# Patient Record
Sex: Female | Born: 1940
Health system: Southern US, Community
[De-identification: ages and names within clinical notes are randomized; demographics above are authoritative.]

## PROBLEM LIST (undated history)

## (undated) DIAGNOSIS — F32A Depression, unspecified: Secondary | ICD-10-CM

## (undated) DIAGNOSIS — K5790 Diverticulosis of intestine, part unspecified, without perforation or abscess without bleeding: Secondary | ICD-10-CM

## (undated) DIAGNOSIS — J189 Pneumonia, unspecified organism: Secondary | ICD-10-CM

## (undated) DIAGNOSIS — K219 Gastro-esophageal reflux disease without esophagitis: Secondary | ICD-10-CM

## (undated) DIAGNOSIS — C50912 Malignant neoplasm of unspecified site of left female breast: Secondary | ICD-10-CM

## (undated) DIAGNOSIS — J45909 Unspecified asthma, uncomplicated: Secondary | ICD-10-CM

## (undated) DIAGNOSIS — C50911 Malignant neoplasm of unspecified site of right female breast: Secondary | ICD-10-CM

## (undated) DIAGNOSIS — I10 Essential (primary) hypertension: Secondary | ICD-10-CM

## (undated) DIAGNOSIS — D649 Anemia, unspecified: Secondary | ICD-10-CM

## (undated) DIAGNOSIS — I890 Lymphedema, not elsewhere classified: Secondary | ICD-10-CM

## (undated) DIAGNOSIS — Z9289 Personal history of other medical treatment: Secondary | ICD-10-CM

## (undated) DIAGNOSIS — Z8719 Personal history of other diseases of the digestive system: Secondary | ICD-10-CM

## (undated) DIAGNOSIS — F329 Major depressive disorder, single episode, unspecified: Secondary | ICD-10-CM

## (undated) DIAGNOSIS — M1990 Unspecified osteoarthritis, unspecified site: Secondary | ICD-10-CM

## (undated) HISTORY — DX: Diverticulosis of intestine, part unspecified, without perforation or abscess without bleeding: K57.90

## (undated) HISTORY — DX: Anemia, unspecified: D64.9

## (undated) HISTORY — PX: TOTAL KNEE ARTHROPLASTY: SHX125

## (undated) HISTORY — PX: APPENDECTOMY: SHX54

## (undated) HISTORY — DX: Major depressive disorder, single episode, unspecified: F32.9

## (undated) HISTORY — PX: CATARACT EXTRACTION W/ INTRAOCULAR LENS  IMPLANT, BILATERAL: SHX1307

## (undated) HISTORY — DX: Unspecified osteoarthritis, unspecified site: M19.90

## (undated) HISTORY — PX: COLONOSCOPY: SHX174

## (undated) HISTORY — DX: Depression, unspecified: F32.A

## (undated) HISTORY — PX: TONSILLECTOMY: SUR1361

## (undated) HISTORY — PX: CHOLECYSTECTOMY OPEN: SUR202

## (undated) HISTORY — DX: Lymphedema, not elsewhere classified: I89.0

## (undated) HISTORY — PX: JOINT REPLACEMENT: SHX530

## (undated) HISTORY — PX: BASAL CELL CARCINOMA EXCISION: SHX1214

## (undated) HISTORY — PX: ABDOMINAL HYSTERECTOMY: SHX81

---

## 1965-08-01 DIAGNOSIS — C50912 Malignant neoplasm of unspecified site of left female breast: Secondary | ICD-10-CM

## 1965-08-01 HISTORY — PX: BREAST BIOPSY: SHX20

## 1965-08-01 HISTORY — PX: MASTECTOMY: SHX3

## 1965-08-01 HISTORY — DX: Malignant neoplasm of unspecified site of left female breast: C50.912

## 2001-08-01 DIAGNOSIS — Z9289 Personal history of other medical treatment: Secondary | ICD-10-CM

## 2001-08-01 DIAGNOSIS — C50911 Malignant neoplasm of unspecified site of right female breast: Secondary | ICD-10-CM

## 2001-08-01 HISTORY — PX: BREAST BIOPSY: SHX20

## 2001-08-01 HISTORY — DX: Malignant neoplasm of unspecified site of right female breast: C50.911

## 2001-08-01 HISTORY — PX: MASTECTOMY: SHX3

## 2001-08-01 HISTORY — DX: Personal history of other medical treatment: Z92.89

## 2002-07-01 LAB — HM COLONOSCOPY

## 2002-07-20 ENCOUNTER — Inpatient Hospital Stay (HOSPITAL_COMMUNITY): Admission: EM | Admit: 2002-07-20 | Discharge: 2002-07-23 | Payer: Self-pay | Admitting: Emergency Medicine

## 2002-07-20 ENCOUNTER — Encounter: Payer: Self-pay | Admitting: Internal Medicine

## 2002-10-10 ENCOUNTER — Other Ambulatory Visit: Admission: RE | Admit: 2002-10-10 | Discharge: 2002-10-10 | Payer: Self-pay | Admitting: Radiology

## 2002-11-04 ENCOUNTER — Ambulatory Visit (HOSPITAL_COMMUNITY): Admission: RE | Admit: 2002-11-04 | Discharge: 2002-11-05 | Payer: Self-pay | Admitting: General Surgery

## 2002-11-04 ENCOUNTER — Encounter: Payer: Self-pay | Admitting: General Surgery

## 2002-11-04 ENCOUNTER — Encounter (INDEPENDENT_AMBULATORY_CARE_PROVIDER_SITE_OTHER): Payer: Self-pay | Admitting: Specialist

## 2002-12-02 ENCOUNTER — Ambulatory Visit (HOSPITAL_COMMUNITY): Admission: RE | Admit: 2002-12-02 | Discharge: 2002-12-02 | Payer: Self-pay | Admitting: Hematology & Oncology

## 2002-12-02 ENCOUNTER — Encounter: Payer: Self-pay | Admitting: Hematology & Oncology

## 2002-12-04 ENCOUNTER — Encounter: Payer: Self-pay | Admitting: General Surgery

## 2002-12-04 ENCOUNTER — Ambulatory Visit (HOSPITAL_BASED_OUTPATIENT_CLINIC_OR_DEPARTMENT_OTHER): Admission: RE | Admit: 2002-12-04 | Discharge: 2002-12-04 | Payer: Self-pay | Admitting: General Surgery

## 2003-09-03 ENCOUNTER — Ambulatory Visit (HOSPITAL_BASED_OUTPATIENT_CLINIC_OR_DEPARTMENT_OTHER): Admission: RE | Admit: 2003-09-03 | Discharge: 2003-09-03 | Payer: Self-pay | Admitting: General Surgery

## 2004-07-14 ENCOUNTER — Ambulatory Visit: Payer: Self-pay | Admitting: Hematology & Oncology

## 2004-07-28 ENCOUNTER — Encounter: Admission: RE | Admit: 2004-07-28 | Discharge: 2004-09-08 | Payer: Self-pay | Admitting: Hematology & Oncology

## 2004-11-09 ENCOUNTER — Ambulatory Visit: Payer: Self-pay | Admitting: Hematology & Oncology

## 2004-12-04 ENCOUNTER — Emergency Department (HOSPITAL_COMMUNITY): Admission: EM | Admit: 2004-12-04 | Discharge: 2004-12-04 | Payer: Self-pay | Admitting: Family Medicine

## 2004-12-06 ENCOUNTER — Emergency Department (HOSPITAL_COMMUNITY): Admission: EM | Admit: 2004-12-06 | Discharge: 2004-12-06 | Payer: Self-pay | Admitting: Family Medicine

## 2005-03-14 ENCOUNTER — Ambulatory Visit: Payer: Self-pay | Admitting: Hematology & Oncology

## 2005-09-09 ENCOUNTER — Ambulatory Visit: Payer: Self-pay | Admitting: Hematology & Oncology

## 2006-03-09 ENCOUNTER — Ambulatory Visit: Payer: Self-pay | Admitting: Hematology & Oncology

## 2006-03-13 LAB — COMPREHENSIVE METABOLIC PANEL
ALT: 20 U/L (ref 0–40)
AST: 19 U/L (ref 0–37)
CO2: 29 mEq/L (ref 19–32)
Calcium: 9.4 mg/dL (ref 8.4–10.5)
Chloride: 105 mEq/L (ref 96–112)
Sodium: 143 mEq/L (ref 135–145)
Total Bilirubin: 0.8 mg/dL (ref 0.3–1.2)
Total Protein: 6.3 g/dL (ref 6.0–8.3)

## 2006-03-13 LAB — CBC WITH DIFFERENTIAL/PLATELET
BASO%: 0.2 % (ref 0.0–2.0)
Eosinophils Absolute: 0.1 10*3/uL (ref 0.0–0.5)
HCT: 42.9 % (ref 34.8–46.6)
MCHC: 34.4 g/dL (ref 32.0–36.0)
MONO#: 0.3 10*3/uL (ref 0.1–0.9)
NEUT#: 3.1 10*3/uL (ref 1.5–6.5)
NEUT%: 66.6 % (ref 39.6–76.8)
Platelets: 236 10*3/uL (ref 145–400)
WBC: 4.6 10*3/uL (ref 3.9–10.0)
lymph#: 1.1 10*3/uL (ref 0.9–3.3)

## 2006-07-31 ENCOUNTER — Inpatient Hospital Stay (HOSPITAL_COMMUNITY): Admission: RE | Admit: 2006-07-31 | Discharge: 2006-08-03 | Payer: Self-pay | Admitting: Orthopedic Surgery

## 2006-09-06 ENCOUNTER — Ambulatory Visit: Payer: Self-pay | Admitting: Hematology & Oncology

## 2006-09-11 LAB — CBC WITH DIFFERENTIAL/PLATELET
BASO%: 0.3 % (ref 0.0–2.0)
EOS%: 4.7 % (ref 0.0–7.0)
HCT: 40.2 % (ref 34.8–46.6)
LYMPH%: 23.8 % (ref 14.0–48.0)
MCH: 31.9 pg (ref 26.0–34.0)
MCHC: 34.1 g/dL (ref 32.0–36.0)
MONO#: 0.3 10*3/uL (ref 0.1–0.9)
NEUT%: 64.7 % (ref 39.6–76.8)
Platelets: 257 10*3/uL (ref 145–400)
RBC: 4.3 10*6/uL (ref 3.70–5.32)
WBC: 4.7 10*3/uL (ref 3.9–10.0)

## 2006-09-11 LAB — COMPREHENSIVE METABOLIC PANEL
ALT: 11 U/L (ref 0–35)
AST: 16 U/L (ref 0–37)
Alkaline Phosphatase: 98 U/L (ref 39–117)
CO2: 27 mEq/L (ref 19–32)
Creatinine, Ser: 0.65 mg/dL (ref 0.40–1.20)
Sodium: 142 mEq/L (ref 135–145)
Total Bilirubin: 0.7 mg/dL (ref 0.3–1.2)
Total Protein: 6.5 g/dL (ref 6.0–8.3)

## 2006-11-06 ENCOUNTER — Inpatient Hospital Stay (HOSPITAL_COMMUNITY): Admission: RE | Admit: 2006-11-06 | Discharge: 2006-11-08 | Payer: Self-pay | Admitting: Orthopedic Surgery

## 2007-03-12 ENCOUNTER — Ambulatory Visit: Payer: Self-pay | Admitting: Hematology & Oncology

## 2007-04-04 LAB — COMPREHENSIVE METABOLIC PANEL
ALT: 11 U/L (ref 0–35)
Alkaline Phosphatase: 93 U/L (ref 39–117)
CO2: 25 mEq/L (ref 19–32)
Creatinine, Ser: 0.71 mg/dL (ref 0.40–1.20)
Sodium: 139 mEq/L (ref 135–145)
Total Bilirubin: 0.5 mg/dL (ref 0.3–1.2)

## 2007-04-04 LAB — CBC WITH DIFFERENTIAL/PLATELET
BASO%: 0.4 % (ref 0.0–2.0)
HCT: 39.2 % (ref 34.8–46.6)
LYMPH%: 21.9 % (ref 14.0–48.0)
MCH: 31.4 pg (ref 26.0–34.0)
MCHC: 34.8 g/dL (ref 32.0–36.0)
MCV: 90.1 fL (ref 81.0–101.0)
MONO#: 0.4 10*3/uL (ref 0.1–0.9)
MONO%: 6 % (ref 0.0–13.0)
NEUT%: 68.9 % (ref 39.6–76.8)
Platelets: 286 10*3/uL (ref 145–400)
RBC: 4.35 10*6/uL (ref 3.70–5.32)

## 2007-10-15 ENCOUNTER — Ambulatory Visit: Payer: Self-pay | Admitting: Hematology & Oncology

## 2007-10-17 LAB — COMPREHENSIVE METABOLIC PANEL
ALT: 13 U/L (ref 0–35)
BUN: 12 mg/dL (ref 6–23)
CO2: 26 mEq/L (ref 19–32)
Calcium: 8.8 mg/dL (ref 8.4–10.5)
Chloride: 105 mEq/L (ref 96–112)
Creatinine, Ser: 0.66 mg/dL (ref 0.40–1.20)
Glucose, Bld: 95 mg/dL (ref 70–99)
Total Bilirubin: 0.5 mg/dL (ref 0.3–1.2)

## 2007-10-17 LAB — CBC WITH DIFFERENTIAL/PLATELET
BASO%: 0.7 % (ref 0.0–2.0)
Basophils Absolute: 0 10*3/uL (ref 0.0–0.1)
Eosinophils Absolute: 0.2 10*3/uL (ref 0.0–0.5)
HCT: 39.3 % (ref 34.8–46.6)
HGB: 13.8 g/dL (ref 11.6–15.9)
LYMPH%: 25.5 % (ref 14.0–48.0)
MCHC: 35 g/dL (ref 32.0–36.0)
MONO#: 0.4 10*3/uL (ref 0.1–0.9)
NEUT#: 3 10*3/uL (ref 1.5–6.5)
NEUT%: 61.8 % (ref 39.6–76.8)
Platelets: 237 10*3/uL (ref 145–400)
WBC: 4.9 10*3/uL (ref 3.9–10.0)
lymph#: 1.3 10*3/uL (ref 0.9–3.3)

## 2008-04-18 ENCOUNTER — Ambulatory Visit: Payer: Self-pay | Admitting: Hematology & Oncology

## 2008-05-01 LAB — CBC WITH DIFFERENTIAL (CANCER CENTER ONLY)
Eosinophils Absolute: 0.2 10*3/uL (ref 0.0–0.5)
LYMPH%: 26.8 % (ref 14.0–48.0)
MCH: 30.9 pg (ref 26.0–34.0)
MCV: 91 fL (ref 81–101)
MONO%: 6.2 % (ref 0.0–13.0)
NEUT%: 62.2 % (ref 39.6–80.0)
Platelets: 236 10*3/uL (ref 145–400)
RBC: 4.35 10*6/uL (ref 3.70–5.32)

## 2008-05-01 LAB — COMPREHENSIVE METABOLIC PANEL
ALT: 13 U/L (ref 0–35)
BUN: 14 mg/dL (ref 6–23)
CO2: 23 mEq/L (ref 19–32)
Calcium: 8.8 mg/dL (ref 8.4–10.5)
Chloride: 106 mEq/L (ref 96–112)
Creatinine, Ser: 0.31 mg/dL — ABNORMAL LOW (ref 0.40–1.20)
Total Bilirubin: 0.6 mg/dL (ref 0.3–1.2)

## 2008-11-18 ENCOUNTER — Ambulatory Visit: Payer: Self-pay | Admitting: Hematology & Oncology

## 2008-11-19 LAB — COMPREHENSIVE METABOLIC PANEL
ALT: 10 U/L (ref 0–35)
AST: 15 U/L (ref 0–37)
Albumin: 4.1 g/dL (ref 3.5–5.2)
Alkaline Phosphatase: 64 U/L (ref 39–117)
Calcium: 8.9 mg/dL (ref 8.4–10.5)
Chloride: 105 mEq/L (ref 96–112)
Creatinine, Ser: 0.74 mg/dL (ref 0.40–1.20)
Potassium: 4.1 mEq/L (ref 3.5–5.3)

## 2008-11-19 LAB — CBC WITH DIFFERENTIAL (CANCER CENTER ONLY)
BASO#: 0 10*3/uL (ref 0.0–0.2)
Eosinophils Absolute: 0.2 10*3/uL (ref 0.0–0.5)
HGB: 14.4 g/dL (ref 11.6–15.9)
LYMPH%: 23.9 % (ref 14.0–48.0)
MCH: 31.5 pg (ref 26.0–34.0)
MCV: 94 fL (ref 81–101)
MONO%: 5.7 % (ref 0.0–13.0)
NEUT#: 3.3 10*3/uL (ref 1.5–6.5)
RBC: 4.58 10*6/uL (ref 3.70–5.32)

## 2009-05-19 ENCOUNTER — Ambulatory Visit: Payer: Self-pay | Admitting: Hematology & Oncology

## 2009-05-21 LAB — CBC WITH DIFFERENTIAL (CANCER CENTER ONLY)
BASO#: 0 10*3/uL (ref 0.0–0.2)
Eosinophils Absolute: 0.3 10*3/uL (ref 0.0–0.5)
HGB: 14.3 g/dL (ref 11.6–15.9)
LYMPH#: 1.5 10*3/uL (ref 0.9–3.3)
MONO#: 0.4 10*3/uL (ref 0.1–0.9)
NEUT#: 3.4 10*3/uL (ref 1.5–6.5)
RBC: 4.35 10*6/uL (ref 3.70–5.32)

## 2009-05-22 LAB — COMPREHENSIVE METABOLIC PANEL
AST: 19 U/L (ref 0–37)
Alkaline Phosphatase: 51 U/L (ref 39–117)
BUN: 11 mg/dL (ref 6–23)
Creatinine, Ser: 0.73 mg/dL (ref 0.40–1.20)

## 2009-05-22 LAB — VITAMIN D 25 HYDROXY (VIT D DEFICIENCY, FRACTURES): Vit D, 25-Hydroxy: 20 ng/mL — ABNORMAL LOW (ref 30–89)

## 2009-11-17 ENCOUNTER — Ambulatory Visit: Payer: Self-pay | Admitting: Hematology & Oncology

## 2009-12-29 ENCOUNTER — Ambulatory Visit: Payer: Self-pay | Admitting: Hematology & Oncology

## 2009-12-30 LAB — CBC WITH DIFFERENTIAL (CANCER CENTER ONLY)
BASO#: 0.1 10*3/uL (ref 0.0–0.2)
BASO%: 1 % (ref 0.0–2.0)
EOS%: 2.8 % (ref 0.0–7.0)
Eosinophils Absolute: 0.2 10*3/uL (ref 0.0–0.5)
HCT: 43.2 % (ref 34.8–46.6)
HGB: 14.6 g/dL (ref 11.6–15.9)
LYMPH#: 1.5 10*3/uL (ref 0.9–3.3)
LYMPH%: 19.9 % (ref 14.0–48.0)
MCH: 32.3 pg (ref 26.0–34.0)
MCHC: 33.7 g/dL (ref 32.0–36.0)
MCV: 96 fL (ref 81–101)
MONO#: 0.4 10*3/uL (ref 0.1–0.9)
MONO%: 5.4 % (ref 0.0–13.0)
NEUT#: 5.5 10*3/uL (ref 1.5–6.5)
NEUT%: 70.9 % (ref 39.6–80.0)
Platelets: 229 10*3/uL (ref 145–400)
RBC: 4.51 10*6/uL (ref 3.70–5.32)
RDW: 11 % (ref 10.5–14.6)
WBC: 7.7 10*3/uL (ref 3.9–10.0)

## 2009-12-31 LAB — COMPREHENSIVE METABOLIC PANEL
ALT: 18 U/L (ref 0–35)
AST: 28 U/L (ref 0–37)
Albumin: 4 g/dL (ref 3.5–5.2)
Alkaline Phosphatase: 55 U/L (ref 39–117)
BUN: 10 mg/dL (ref 6–23)
CO2: 24 mEq/L (ref 19–32)
Calcium: 8.5 mg/dL (ref 8.4–10.5)
Chloride: 104 mEq/L (ref 96–112)
Creatinine, Ser: 0.63 mg/dL (ref 0.40–1.20)
Glucose, Bld: 97 mg/dL (ref 70–99)
Potassium: 4 mEq/L (ref 3.5–5.3)
Sodium: 139 mEq/L (ref 135–145)
Total Bilirubin: 0.7 mg/dL (ref 0.3–1.2)
Total Protein: 6.3 g/dL (ref 6.0–8.3)

## 2010-06-30 ENCOUNTER — Ambulatory Visit: Payer: Self-pay | Admitting: Hematology & Oncology

## 2010-07-01 LAB — CBC WITH DIFFERENTIAL (CANCER CENTER ONLY)
BASO%: 0.9 % (ref 0.0–2.0)
HCT: 47.1 % — ABNORMAL HIGH (ref 34.8–46.6)
LYMPH%: 24.5 % (ref 14.0–48.0)
MCH: 31.7 pg (ref 26.0–34.0)
MCHC: 32.6 g/dL (ref 32.0–36.0)
MCV: 97 fL (ref 81–101)
MONO#: 0.4 10*3/uL (ref 0.1–0.9)
MONO%: 6.5 % (ref 0.0–13.0)
NEUT%: 64.6 % (ref 39.6–80.0)
RDW: 10.4 % — ABNORMAL LOW (ref 10.5–14.6)
WBC: 6.5 10*3/uL (ref 3.9–10.0)

## 2010-07-02 LAB — COMPREHENSIVE METABOLIC PANEL
ALT: 15 U/L (ref 0–35)
Alkaline Phosphatase: 59 U/L (ref 39–117)
CO2: 20 mEq/L (ref 19–32)
Creatinine, Ser: 0.66 mg/dL (ref 0.40–1.20)
Glucose, Bld: 103 mg/dL — ABNORMAL HIGH (ref 70–99)
Total Bilirubin: 0.6 mg/dL (ref 0.3–1.2)

## 2010-07-02 LAB — VITAMIN D 25 HYDROXY (VIT D DEFICIENCY, FRACTURES): Vit D, 25-Hydroxy: 25 ng/mL — ABNORMAL LOW (ref 30–89)

## 2010-08-21 ENCOUNTER — Encounter: Payer: Self-pay | Admitting: Family Medicine

## 2010-12-17 NOTE — H&P (Signed)
[name], [name] NO.:  [id number]   MEDICAL RECORD NO.:  [id number]          PATIENT TYPE:  EMS   LOCATION:  MAJO                         FACILITY:  [facility]   PHYSICIAN:  Mila Homer. Sherlean Foot, M.D. DATE OF BIRTH:  01/26/1950   DATE OF ADMISSION:  11/06/2006  DATE OF DISCHARGE:                              HISTORY & PHYSICAL   CHIEF COMPLAINT:  End-stage osteoarthritis right knee.   HISTORY OF PRESENT ILLNESS:  [name] is a 70 year old white female  with a five to six year history of left knee pain, no known injury,  mechanical symptoms positive for giving way and painful grinding.  The  patient notes she is cane walk.  No waking pain.  The patient failed  conservative treatment of the right knee.  The patient underwent a left  total knee arthroplasty by Dr. Sherlean Foot on July 31, 2006 which is doing  well.   ALLERGIES:  THE PATIENT HAS A DRUG ALLERGY TO PENICILLIN.   MEDICATIONS:  1. Lexapro 10 mg daily.  2. Femara 2.5 mg daily.  3. Nexium 40 mg b.i.d.  4. Vicodin 5/325 one p.o. q.8h.  5. Celebrex 200 mg 1 daily.  6. Ambien CR 12.5 mg q.h.s. p.r.n.  7. Iron 150 mg one every other day.  8. __________  7.5 mg 1 daily.  9. __________ 10% eyedrops, 1 to 2 drops daily p.r.n.   PAST MEDICAL HISTORY:  GERD.   PAST SURGICAL HISTORY:  1. Appendectomy.  2. Left knee replacement December 2007.  3. Hysterectomy in 1967.  4. Tonsillectomy in 1985.  5. In 1986, cholecystectomy.  6. Bilateral mastectomies.   SOCIAL HISTORY:  The patient has a history of smoking, quit in 1965.  She denies any alcohol abuse.  She lives in a [place] home.  The  patient's primary care physician is Dr. Ashok Norris, [facility].   FAMILY HISTORY:  The patient's mother age 51 with borderline  hypertension and asthma.  Father deceased at age 81 with CHF.  She has  one sibling, a sister, who is age 48 and has hypertension.   REVIEW OF SYSTEMS:  She has dentures on the top and the  bottom.  She  wears glasses.  History of breast cancer.  Has nocturia 2 to 3 times at  night.  She suffers from depression.  Otherwise, review of systems is  negative and noncontributory.   PHYSICAL EXAMINATION:  The patient is a well-nourished, overweight  female who walks with an antalgic gait and a limp on the right.  The  patient's mood and affect are appropriate.  She talks easily with the  examiner.  Height 5 feet 7 inches, weight 220 pounds.  VITAL SIGNS:  Temperature is 98.4, pulse 66, respiratory rate 18, blood  pressure 110/60.  CARDIAC:  Regular rate and rhythm, no murmur, rubs, or gallops noted.  LUNGS:  Clear to auscultation bilaterally.  No wheezing, rhonchi, or  rales noted.  ABDOMEN:  Soft, nontender, bowel sounds x4 quadrants.  She does have a  well-healed surgical incision from a previous cholecystectomy.  BREASTS, INTEGUMENTARY, AND RECTAL:  Deferred  at this time.  HEENT:  Head is normocephalic, atraumatic.  The patient does have  maxillary sinus tenderness.  No frontal tenderness to palpation.  Conjunctivae are pink.  Sclerae is nonicteric, PERRLA, EOMs are intact.  No visible external ear deformities.  TMs pearly and gray bilaterally.  Nose:  Nasal septum midline.  Nasal mucosa pink and moist without  polyps.  Nares are pink and moist.  Pharynx without erythema or exudate.  Tongue and uvula is midline.  NECK:  Trachea midline, no lymphadenopathy.  Carotids are 2+ without  bruits.  No tenderness along the cervical spine with palpation.  She has  full range of motion of the cervical spine.  NEUROLOGIC:  The patient is alert and oriented x3.  Cranial nerves II-  XII grossly intact.  Lower extremity strength testing reveals 5 over 5  strength throughout.  BACK:  No tenderness with palpation over the thoracic or lumbar spine.  MUSCULOSKELETAL:  Upper extremities, she has full range of motion of the  upper extremities without pain.  Upper extremities with symmetric  size  and shape.  Radial pulses are 2+ bilaterally and equal and symmetric.  She has good sensation to light touch throughout the fingertips.  Lower  extremities:  She has full range of motion of both hips without pain.  Right knee 0 to 125 degrees flexion, valgus deformity at 10 degrees.  Slight shift with valgus stressing.  Positive tenderness over the  lateral joint line with palpation.  Left knee 0 to 125 degrees,  valgus/varus stressing reveals no instability, a well-healed surgical  incision.  Dorsal and pedal pulses are 2+ bilaterally.  She has good  sensation to the feet bilaterally to light touch.   X-rays of the right knee show tricompartmental osteoarthritis.  She has  bone on bone with valgus deformity.  She has bone on bone lateral  compartment with valgus deformity.   IMPRESSION:  1. End-stage osteoarthritis of the right knee.  2. History of left total knee December 2007.  3. History of breast cancer.  4. Depression.  5. Maxillary sinus tenderness.   PLAN:  1. The patient is to undergo a right total knee arthroplasty by Dr.      Sherlean Foot on November 06, 2006.  2. Empirical treatment with Z-Pak for maxillary sinus tenderness.  3. The patient to undergo all preoperative labs and testing prior to      surgery.      Richardean Canal, P.A.    ______________________________  Mila Homer. Sherlean Foot, M.D.    GC/MEDQ  D:  10/31/2006  T:  10/31/2006  Job:  [id number]   cc:   Mila Homer. Sherlean Foot, M.D.

## 2010-12-17 NOTE — Discharge Summary (Signed)
[name], [name]             ACCOUNT NO.:  [id number]   MEDICAL RECORD NO.:  [id number]          PATIENT TYPE:  INP   LOCATION:  5011                         FACILITY:  [facility]   PHYSICIAN:  Mila Homer. Sherlean Foot, M.D. DATE OF BIRTH:  [birth year]   DATE OF ADMISSION:  07/31/2006  DATE OF DISCHARGE:  08/03/2006                               DISCHARGE SUMMARY   ADMISSION DIAGNOSES:  1. End-stage osteoarthritis bilateral knees, left worse than right.  2. Breast cancer.  3. Depression.   DISCHARGE DIAGNOSES:  1. End-stage osteoarthritis bilateral knees status post left total      knee arthroplasty.  2. Acute blood loss anemia secondary to surgery.  3. Mild hypokalemia, now resolved.  4. Constipation.  5. Breast cancer.  6. Depression.   SURGICAL PROCEDURES:  On July 31, 2006 [name] underwent a left  total knee arthroplasty by Dr. Mila Homer. Lucey, assisted by Rexene Edison  PA-C.  She had a NexGen femoral component size D left placed with a  NexGen all poly patella standard size 35, 9 mm thickness.  A NexGen  complete fluted stem tibial component size 6 with an articular surface  size striped green CD 17 mm height.   COMPLICATIONS:  None.   CONSULTS:  1. Physical therapy consult August 01, 2006.  2. Occupational therapy consult and case management consult August 02, 2006.   HISTORY OF PRESENT ILLNESS:  This 70 year old white female patient  presented to Dr. Sherlean Foot with a history of osteoarthritis of bilateral  knees.  The left currently is worse than right.  She has had great  difficulty with pain in the knees and ambulation with them giving way at  times.  She has failed conservative treatment and x-rays show end-stage  arthritic changes.  Because of this, she is presenting for a left knee  replacement.   HOSPITAL COURSE:  [name] tolerated her surgical procedure well  without immediate postoperative complications.  She was transferred to  5000.  Postop day #1:   T-max 100.2, vitals were stable.  Hemoglobin  11.5, hematocrit 32.9.  Leg was neurovascularly intact.  She was started  on therapy per protocol.   Postop day #2:  Pain was fairly well controlled.  She remained afebrile,  vitals were stable.  Potassium was low at 3.3 and that was subsequently  supplemented.  She had made some slow progress with therapy the day  before partially due to the arthritis in her right knee and that was  continued.   Postop day #3: She is doing well and wants to go home.  She remains  afebrile, hemoglobin 9.7, hematocrit 28.4 and potassium is improved to  3.9.  It was felt she is ready for discharge home later today.  After  treatment with a laxative she will be discharged home.   DISCHARGE INSTRUCTIONS:  Diet:  She can resume her regular  prehospitalization diet.   MEDICATIONS:  She may resume her prehospitalization meds except no  Vicodin while on other pain meds.  Home meds include:  1. Omeprazole 20 mg p.o. every morning.  2. Lexapro 10 mg p.o. every bed time.  3. Ambien CR 12.5 mg p.o. every bed time p.r.n. insomnia.  4. Femara 2.5 mg p.o. every bed time.  5. Vicodin 10/650 one p.o. every 4h p.r.n. for pain; not to take while      on Percocet.  6. Poly iron 150 mg p.o. every other day.  7. TUMS 1 tablet p.o. b.i.d.  8. Eye drops apply t.i.d.   Additional meds at this time include:  1. Lovenox 40 mg subcu every morning, last dose January 14; 9 with no      refill.  2. Percocet 5/325 mg 1-2 tablets p.o. every 4h p.r.n. for pain; 60      with no refill.  3. Robaxin 500 mg 1-2 tablets p.o. every 6h p.r.n. for spasms; 40 with      no refill.   ACTIVITY:  She can be out of bed weightbearing as tolerated on the left  leg with use of a walker.  She may use an immobilizer on the right leg  for stability if needed due to her severe ROA.  She needs home CPM 0-90  degrees 6 to 8 hours a day and home health PT per [facility].  Please see the  blue total knee discharge sheet for further  activity and instructions.   WOUND CARE:  She may shower after no drainage from the wound for 2 days.  Please see the blue total knee discharge sheet for further wound care  instructions.   FOLLOW-UP:  She needs to follow up with Dr. Sherlean Foot in our office on  Tuesday, August 15, 2006 and needs to call [phone] for that  appointment.   LABORATORY DATA:  Chest x-ray done on an December 24 showed COPD, stable  cardiomegaly and hiatal hernia.   Hemoglobin/hematocrit have ranged from 14.6 and 42.7 on December 24 to  9.7 and 28.4 on January 3.  White count went from 6.6 on the 24th to a  high of 10 on the 1st.  Platelets went from 225 on the 24th to 164 on 3  January.   Potassium dropped to a low of 3.3 on January 2;  on January 3 it was  3.9.  Glucose went to high of 152 on January 3.  Calcium to a low of 8.2  on the 1st and total protein 5.9 on the on December 24.  All other  laboratory studies were within normal limits.      Legrand Pitts Duffy, P.A.    ______________________________  Mila Homer. Sherlean Foot, M.D.   KED/MEDQ  D:  08/03/2006  T:  08/03/2006  Job:  [id number]

## 2010-12-17 NOTE — Consult Note (Signed)
NAME:  [name], [name]                       ACCOUNT NO.:  [id number]   MEDICAL RECORD NO.:  [id number]                   PATIENT TYPE:  INP   LOCATION:  0447                                 FACILITY:  [facility]   PHYSICIAN:  Petra Kuba, M.D.                 DATE OF BIRTH:  March 26, 1941   DATE OF CONSULTATION:  DATE OF DISCHARGE:                                   CONSULTATION   HISTORY OF PRESENT ILLNESS:  The patient is seen at the request of Dr.  __________  for severe iron deficiency.  She has not any blood in her  bowels, is guaiac negative, but has not had any previous colonic screening  or GI tests.  She does have a moderate amount of reflux with some help from  over the counter Zantac two a day.  She has no swallowing problems or change  in bowel habits.  She has been trying to lose some weight, but has lost  about 70 pounds in the last year.  She has felt weak and run down for some  time.  She came to the ER because of bronchitis and was found to have  hemoglobin of 3 and was admitted for further workup, plans, and transfusion.   PAST MEDICAL HISTORY:  1. Pertinent for breast cancer at a young age with a complete hysterectomy  2. Cholecystectomy.  3. Tonsillectomy.   SOCIAL HISTORY:  Minimizes over the counter medicine use and does not smoke  or drink.   FAMILY HISTORY:  Negative for any obvious GI problems in the family, colon,  cancer, or colon polyps.   MEDICATIONS AT HOME:  None except for the over the counter Zantac.   ALLERGIES:  PENICILLIN.   REVIEW OF SYSTEMS:  Pertinent for no blood in the urine, or femoral vagina,  and she has not shown any in her stool.   PHYSICAL EXAMINATION:  GENERAL:  No acute distress.  The patient looks well  lying in the bed.  Not examined today.  Will be prior to  colonoscopy/endoscopy.   ASSESSMENT:  1. History of breast cancer at a young age with a complete hysterectomy.  2. Longstanding reflux.  3. Iron deficiency in a  patient due for colonic screen.   PLAN:  The risks, benefits, and methods of colonoscopy and if negative, will  tolerate an endoscopy, were discussed with the patient and her daughter, and  will proceed tomorrow by either me or one of my partners which I have  explained to her, with further workup and plans pending those findings.  If  nondiagnostic workup, can probably go home with outpatient small bowel  follow-through or possibly an __________  at some point in the future, or to  just follow her CBCs on iron and repeat guaiacs to see  if any further  workup plans are needed.  Petra Kuba, M.D.    MEM/MEDQ  D:  07/21/2002  T:  07/21/2002  Job:  [id number]   cc:   __________ __________, MD

## 2010-12-17 NOTE — Op Note (Signed)
NAMESAYDEE, ZOLMAN NO.:  [id number]   MEDICAL RECORD NO.:  [id number]          PATIENT TYPE:  INP   LOCATION:  2899                         FACILITY:  [facility]   PHYSICIAN:  Mila Homer. Sherlean Foot, M.D. DATE OF BIRTH:  03-17-1941   DATE OF PROCEDURE:  11/06/2006  DATE OF DISCHARGE:                               OPERATIVE REPORT   SURGEON:  Mila Homer. Sherlean Foot, M.D.   ASSISTANT:  Arnoldo Morale, PA   ANESTHESIA:  General.   PREOPERATIVE DIAGNOSIS:  Right knee osteoarthritis.   POSTOPERATIVE DIAGNOSIS:  Right knee osteoarthritis.  Right knee osteoarthritis.   PROCEDURE:  Right total knee arthroplasty.   INDICATIONS FOR PROCEDURE:  The patient is a 70 year old white female  who had her left knee done six months ago, did very, very well.  Has  failed conservative measures and went to the operating room under  informed consent.   DESCRIPTION OF PROCEDURE:  The patient was laid supine, administered  general anesthesia, Foley catheter placed and right leg was prepped and  draped in usual sterile fashion.  Leg was exsanguinated and tourniquet  set at 350 mmHg and for 1 hour.  I then made a midline incision  approximately 7 inches in length with a #10 blade.  I used 10 blade to  make a medial parapatellar arthrotomy and performed a synovectomy.  I  elevated the deep MCL off the medial crest of the tibia but not down  more than 2 cm since this was a valgus knee.  I then everted the  patella, measured 20 mm.  I reamed down to 12 and with the 32-mm  template, drilled three lug holes and then with prosthetic trial in  place, recreated the 20 mm thickness.  I then went into flexion and  subluxed the patella laterally.  I used the extramedullary alignment  system on the tibia make a perpendicular cut to the anatomic axis of  tibia.  I then made an intramedullary drill hole in the femur using  intramedullary guide set on 4 degree valgus cut.  I used the distal  femoral cutting  block and made the distal femoral cut with a sagittal  saw.  Then marked out the epicondylar axis and posterior condylar angle  which measured 5 degrees.  I then sized to size E, pinned it to 5 degree  external rotation holes and used a 4 in 1 cutting block to make anterior  posterior and chamfer cuts.  I then placed a lamina spreader in the  knee, removed the posterior condylar osteophytes ACL, PCL, medial and  lateral menisci.  I then obtained flexion/extension gap balance, had to  release the lateral structures through a pie crusting technique.  The 12-  mm spacer block then afforded good flexion extension gap balance.  I  then finished the femur with a size E finishing block and cut for a high  flex with given the flexibility to use the gender specific knee.  Then  finished the tibia with a size 5 tibial tray drilling the keel.  Upon  trialing, I felt that the regular E LPS  flex offered more coverage and  went ahead and went with that.  I then removed trial components,  copiously irrigated and then cemented in the E LPS flex femur.  The size  5 tibial tray, a 12 mm poly, a 32-mm patella, removed all excess cement,  allowed the cement harden in extension.  At this point I then placed a  Hemovac coming out superolaterally and deep the arthrotomy and pain  catheter coming out supermedial and superficial to the arthrotomy.  I  injected capsule 20 mL of 0.5% Marcaine with epinephrine.  I then let  the tourniquet down which was at 53 minutes and obtained hemostasis.  I  then copiously irrigated once again.  I then closed the arthrotomy with  figure-of-eight 1-0 Vicryl sutures, deep soft tissues with interrupted 0-  0 Vicryl sutures, subcuticular 2-0 Vicryl stitch and skin staples.   COMPLICATIONS:  None.   DRAINS:  One Hemovac, one pain catheter.   ESTIMATED BLOOD LOSS:  300 mL.   TOURNIQUET TIME:  53 minutes.           ______________________________  Mila Homer Sherlean Foot, M.D.      SDL/MEDQ  D:  11/06/2006  T:  11/06/2006  Job:  [id number]

## 2011-02-03 ENCOUNTER — Other Ambulatory Visit: Payer: Self-pay | Admitting: Hematology & Oncology

## 2011-02-03 ENCOUNTER — Encounter: Payer: Medicare Other | Admitting: Hematology & Oncology

## 2011-02-03 ENCOUNTER — Encounter (HOSPITAL_BASED_OUTPATIENT_CLINIC_OR_DEPARTMENT_OTHER): Payer: Medicare Other | Admitting: Hematology & Oncology

## 2011-02-03 DIAGNOSIS — J40 Bronchitis, not specified as acute or chronic: Secondary | ICD-10-CM

## 2011-02-03 DIAGNOSIS — C50919 Malignant neoplasm of unspecified site of unspecified female breast: Secondary | ICD-10-CM

## 2011-02-03 DIAGNOSIS — Z170 Estrogen receptor positive status [ER+]: Secondary | ICD-10-CM

## 2011-02-03 LAB — CBC WITH DIFFERENTIAL (CANCER CENTER ONLY)
BASO#: 0 10*3/uL (ref 0.0–0.2)
HCT: 41.7 % (ref 34.8–46.6)
HGB: 14.5 g/dL (ref 11.6–15.9)
LYMPH#: 1.1 10*3/uL (ref 0.9–3.3)
MONO#: 0.6 10*3/uL (ref 0.1–0.9)
NEUT%: 74.1 % (ref 39.6–80.0)
WBC: 7.6 10*3/uL (ref 3.9–10.0)

## 2011-02-03 LAB — COMPREHENSIVE METABOLIC PANEL
BUN: 12 mg/dL (ref 6–23)
CO2: 25 mEq/L (ref 19–32)
Calcium: 9 mg/dL (ref 8.4–10.5)
Chloride: 104 mEq/L (ref 96–112)
Creatinine, Ser: 0.84 mg/dL (ref 0.50–1.10)
Glucose, Bld: 92 mg/dL (ref 70–99)

## 2011-02-08 ENCOUNTER — Ambulatory Visit: Payer: Medicare Other | Admitting: Hematology & Oncology

## 2011-03-01 ENCOUNTER — Encounter: Payer: Self-pay | Admitting: Family Medicine

## 2011-03-01 DIAGNOSIS — E559 Vitamin D deficiency, unspecified: Secondary | ICD-10-CM | POA: Insufficient documentation

## 2011-03-01 DIAGNOSIS — C50911 Malignant neoplasm of unspecified site of right female breast: Secondary | ICD-10-CM | POA: Insufficient documentation

## 2011-03-01 DIAGNOSIS — K5790 Diverticulosis of intestine, part unspecified, without perforation or abscess without bleeding: Secondary | ICD-10-CM | POA: Insufficient documentation

## 2011-06-29 ENCOUNTER — Other Ambulatory Visit: Payer: Self-pay | Admitting: *Deleted

## 2011-06-29 DIAGNOSIS — F329 Major depressive disorder, single episode, unspecified: Secondary | ICD-10-CM

## 2011-06-29 DIAGNOSIS — C50919 Malignant neoplasm of unspecified site of unspecified female breast: Secondary | ICD-10-CM

## 2011-06-29 NOTE — Telephone Encounter (Signed)
Received a refill request from [facility] pharmacy requesting Lexapro 10 mg po daily #30. Request sent to Dr Myna Hidalgo for approval.

## 2011-06-30 MED ORDER — ESCITALOPRAM OXALATE 10 MG PO TABS: 10.0000 mg | ORAL_TABLET | Freq: Every day | ORAL | Status: DC

## 2011-08-09 ENCOUNTER — Telehealth: Payer: Self-pay | Admitting: Hematology & Oncology

## 2011-08-09 NOTE — Telephone Encounter (Signed)
Pt called and cx 08/10/11 apt due to being sick.  She resch for 09/13/11

## 2011-08-10 ENCOUNTER — Ambulatory Visit: Payer: Medicare Other | Admitting: Hematology & Oncology

## 2011-08-10 ENCOUNTER — Other Ambulatory Visit: Payer: Medicare Other | Admitting: Lab

## 2011-09-13 ENCOUNTER — Other Ambulatory Visit (HOSPITAL_BASED_OUTPATIENT_CLINIC_OR_DEPARTMENT_OTHER): Payer: Medicare Other | Admitting: Lab

## 2011-09-13 ENCOUNTER — Ambulatory Visit (HOSPITAL_BASED_OUTPATIENT_CLINIC_OR_DEPARTMENT_OTHER): Payer: Medicare Other | Admitting: Hematology & Oncology

## 2011-09-13 ENCOUNTER — Telehealth: Payer: Self-pay | Admitting: Hematology & Oncology

## 2011-09-13 DIAGNOSIS — M810 Age-related osteoporosis without current pathological fracture: Secondary | ICD-10-CM

## 2011-09-13 DIAGNOSIS — M4000 Postural kyphosis, site unspecified: Secondary | ICD-10-CM

## 2011-09-13 DIAGNOSIS — C50919 Malignant neoplasm of unspecified site of unspecified female breast: Secondary | ICD-10-CM

## 2011-09-13 DIAGNOSIS — Z170 Estrogen receptor positive status [ER+]: Secondary | ICD-10-CM

## 2011-09-13 LAB — CBC WITH DIFFERENTIAL (CANCER CENTER ONLY)
BASO%: 0.5 % (ref 0.0–2.0)
EOS%: 4.7 % (ref 0.0–7.0)
LYMPH%: 29.3 % (ref 14.0–48.0)
MCH: 32.3 pg (ref 26.0–34.0)
MCHC: 33.4 g/dL (ref 32.0–36.0)
MCV: 97 fL (ref 81–101)
MONO#: 0.5 10*3/uL (ref 0.1–0.9)
MONO%: 7.7 % (ref 0.0–13.0)
Platelets: 183 10*3/uL (ref 145–400)
RDW: 12.8 % (ref 11.1–15.7)
WBC: 6 10*3/uL (ref 3.9–10.0)

## 2011-09-13 NOTE — Progress Notes (Signed)
CC:   [name], M.D. [name]. [name], M.D.  DIAGNOSIS:  Stage IIB (T2 N1 M0) ductal carcinoma of the right breast.  CURRENT THERAPY:  Observation.  INTERIM HISTORY:  [name] comes in for followup.  We last saw her back in July 2012.  Since then she has been doing well.  She has had no complaints.  There has been no change in her medications.  She has had no problems with nausea or vomiting.  There has been no bony pain.  She has had no change in bowel or bladder habits.  She did "bump her leg"a few days ago with her left leg.  She has a little bit of soreness in the pretibial area.  She has had no headache.  There has been no dysphasia or odynophagia.  PHYSICAL EXAM:  This is a well-developed well-nourished white female in no obvious distress.  Vital signs:  98.5, pulse 63, respiratory rate 18, blood pressure 125/79, weight is 246.  Head and neck:  Normocephalic, atraumatic skull.  There are no ocular or oral lesions.  There are no palpable cervical or supraclavicular lymph nodes.  Lungs:  Clear bilaterally.  Cardiac:  Regular rate and rhythm with a normal S1, S2. There are no murmurs, rubs or bruits.  Breasts:  Bilateral mastectomies. Chest wall is unremarkable.  She has no nodularity or erythema on the chest wall bilaterally.  There is no bilateral axillary adenopathy. Abdomen:  Soft with good bowel sounds.  There is no fluid wave.  There is no guarding or rebound tenderness.  There is no palpable hepatosplenomegaly.  Back:  She has some kyphosis.  No tenderness is noted over the spine, ribs, or hips.  Extremities:  No clubbing, cyanosis or edema.  I cannot detect any kind of phlebitis with the left leg.  She does have some osteoarthritic changes in her joints. Neurological:  No focal neurological deficits.  LABORATORY STUDIES:  White cell count is 6, hemoglobin 14.5, hematocrit 43.4, platelet count 183.  IMPRESSION:  [name] is a 71 year old white female with  history of stage IIB ductal carcinoma of the right breast.  She underwent mastectomy.  She had 1 positive lymph node.  The tumor is ER positive. She did receive adjuvant chemotherapy with epirubicin Cytoxan.  She completed this in I think June of 2004.  She then went on to receive Femara.  She completed Femara I think back in the end of 2011.  I do not see any evidence of recurrent disease.  Again, there is always that risk.  Will go ahead and plan to get her back in 6 more months for followup.  I do not see a need for any x-ray studies in between visits.   ______________________________ Josph Macho, M.D. PRE/MEDQ  D:  09/13/2011  T:  09/13/2011  Job:  [id number]

## 2011-09-13 NOTE — Progress Notes (Signed)
This office note has been dictated.

## 2011-09-13 NOTE — Telephone Encounter (Signed)
Mailed 03-12-12 schedule

## 2011-09-14 LAB — COMPREHENSIVE METABOLIC PANEL
ALT: 15 U/L (ref 0–35)
Alkaline Phosphatase: 53 U/L (ref 39–117)
Creatinine, Ser: 0.64 mg/dL (ref 0.50–1.10)
Sodium: 143 mEq/L (ref 135–145)
Total Bilirubin: 0.6 mg/dL (ref 0.3–1.2)
Total Protein: 6.4 g/dL (ref 6.0–8.3)

## 2011-09-14 LAB — LACTATE DEHYDROGENASE: LDH: 181 U/L (ref 94–250)

## 2011-09-20 ENCOUNTER — Telehealth: Payer: Self-pay | Admitting: *Deleted

## 2011-09-20 MED ORDER — VITAMIN D 1000 UNITS PO TABS: 2000.0000 [IU] | ORAL_TABLET | Freq: Every day | ORAL | Status: AC

## 2011-09-20 NOTE — Telephone Encounter (Signed)
Spoke to pt regarding labs from 09/13/11. Per Dr Myna Hidalgo "vitamin D is too low.. how much is she on?" She stated she is taking 1000iu daily. To increase to 2000 iu daily. She verbalized understanding & stated she would double up on her 1000 iu caps but understands that she can go up to the 2000 iu capsules when she runs out.

## 2011-11-10 ENCOUNTER — Other Ambulatory Visit: Payer: Self-pay | Admitting: *Deleted

## 2011-11-11 ENCOUNTER — Other Ambulatory Visit: Payer: Self-pay | Admitting: *Deleted

## 2011-11-11 DIAGNOSIS — C50919 Malignant neoplasm of unspecified site of unspecified female breast: Secondary | ICD-10-CM

## 2011-11-11 DIAGNOSIS — F329 Major depressive disorder, single episode, unspecified: Secondary | ICD-10-CM

## 2011-11-11 MED ORDER — ESCITALOPRAM OXALATE 10 MG PO TABS: 10.0000 mg | ORAL_TABLET | Freq: Every day | ORAL | Status: DC

## 2011-11-11 NOTE — Telephone Encounter (Signed)
Received refill request from [facility] and the pt for Lexapro 10 mg #30. She is tolerating this well. Due for f/u in Aug. Authorized refill via [facility].

## 2012-03-12 ENCOUNTER — Other Ambulatory Visit (HOSPITAL_BASED_OUTPATIENT_CLINIC_OR_DEPARTMENT_OTHER): Payer: Medicare Other | Admitting: Lab

## 2012-03-12 ENCOUNTER — Telehealth: Payer: Self-pay | Admitting: Hematology & Oncology

## 2012-03-12 ENCOUNTER — Other Ambulatory Visit: Payer: Medicare Other | Admitting: Lab

## 2012-03-12 ENCOUNTER — Ambulatory Visit (HOSPITAL_BASED_OUTPATIENT_CLINIC_OR_DEPARTMENT_OTHER): Payer: Medicare Other | Admitting: Hematology & Oncology

## 2012-03-12 ENCOUNTER — Ambulatory Visit: Payer: Medicare Other | Admitting: Hematology & Oncology

## 2012-03-12 VITALS — BP 122/83 | HR 60 | Temp 97.5°F | Resp 22 | Ht 67.0 in | Wt 237.0 lb

## 2012-03-12 DIAGNOSIS — Z9010 Acquired absence of unspecified breast and nipple: Secondary | ICD-10-CM

## 2012-03-12 DIAGNOSIS — Z853 Personal history of malignant neoplasm of breast: Secondary | ICD-10-CM

## 2012-03-12 DIAGNOSIS — C50919 Malignant neoplasm of unspecified site of unspecified female breast: Secondary | ICD-10-CM

## 2012-03-12 DIAGNOSIS — M810 Age-related osteoporosis without current pathological fracture: Secondary | ICD-10-CM

## 2012-03-12 LAB — CBC WITH DIFFERENTIAL (CANCER CENTER ONLY)
BASO#: 0 10*3/uL (ref 0.0–0.2)
Eosinophils Absolute: 0.2 10*3/uL (ref 0.0–0.5)
HGB: 14.2 g/dL (ref 11.6–15.9)
LYMPH#: 1.7 10*3/uL (ref 0.9–3.3)
MCH: 32.7 pg (ref 26.0–34.0)
MONO#: 0.5 10*3/uL (ref 0.1–0.9)
NEUT#: 3.8 10*3/uL (ref 1.5–6.5)
Platelets: 198 10*3/uL (ref 145–400)
RBC: 4.34 10*6/uL (ref 3.70–5.32)
WBC: 6.3 10*3/uL (ref 3.9–10.0)

## 2012-03-12 NOTE — Telephone Encounter (Signed)
Mailed 09-2012 schedule

## 2012-03-12 NOTE — Progress Notes (Signed)
This office note has been dictated.

## 2012-03-13 LAB — COMPREHENSIVE METABOLIC PANEL
Albumin: 4 g/dL (ref 3.5–5.2)
Alkaline Phosphatase: 54 U/L (ref 39–117)
BUN: 11 mg/dL (ref 6–23)
CO2: 29 mEq/L (ref 19–32)
Calcium: 9.5 mg/dL (ref 8.4–10.5)
Chloride: 104 mEq/L (ref 96–112)
Glucose, Bld: 88 mg/dL (ref 70–99)
Potassium: 4.4 mEq/L (ref 3.5–5.3)
Sodium: 140 mEq/L (ref 135–145)
Total Protein: 6.2 g/dL (ref 6.0–8.3)

## 2012-03-13 NOTE — Progress Notes (Signed)
CC:   Claude Manges, MD Angelia Mould. Derrell Lolling, M.D.  DIAGNOSIS:  Stage IIB (T2 N1 M0) ductal carcinoma of the right breast.  CURRENT THERAPY:  Observation.  INTERIM HISTORY:  Ms. [name] comes in for her followup.  She is doing well.  She got some chickens for, I think, Easter.  She is enjoying these.  She has had no problems health-wise.  There has been no bony pain.  She has had no change in bowel or bladder habits.  There has been no cough. There has been no leg swelling.  PHYSICAL EXAMINATION:  This is a well-developed, well-nourished white female in no obvious distress.  Vital signs:  Temperature of 97.4, pulse 60, respiratory rate 22, blood pressure 122/83.  Weight is 237.  Head and neck:  Normocephalic, atraumatic skull.  There are no ocular or oral lesions.  There are no palpable cervical or supraclavicular lymph nodes. Lungs:  Clear bilaterally.  Cardiac:  Regular rate and rhythm with a normal S1, S2.  There are no murmurs, rubs or bruits.  Breasts: Bilateral mastectomies.  She has no chest wall nodules or erythema bilaterally.  There is no bilateral axillary adenopathy.  Abdomen:  Soft with good bowel sounds.  There is no fluid wave.  There is no palpable abdominal mass.  There is no palpable hepatosplenomegaly.  Back:  No kyphosis or osteoporotic changes.  She has no tenderness over the spine, ribs, or hips.  Extremities:  No clubbing, cyanosis or edema.  No lymphedema is noted in the right arm.  LABORATORY STUDIES:  White cell count is 6.3, hemoglobin 14.2, hematocrit 42.3, platelet count is 198.  IMPRESSION:  [name] is a 71 year old white female with a history of stage IIB infiltrating ductal carcinoma of the right breast.  She had 1 positive lymph node.  Her tumor was estrogen receptor-positive.  She completed adjuvant chemotherapy in June 2004.  She then had Femara, which was completed back in 2011.  She has had no evidence of recurrent disease.  We will  continue to follow her along every 6 months.  Since she has had mastectomies bilaterally, there is no role for mammograms.    ______________________________ Josph Macho, M.D. PRE/MEDQ  D:  03/12/2012  T:  03/13/2012  Job:  [id number]

## 2012-03-14 ENCOUNTER — Telehealth: Payer: Self-pay | Admitting: *Deleted

## 2012-03-14 NOTE — Telephone Encounter (Signed)
Called patient to let her know that her labwork was all great per dr. ennever 

## 2012-03-14 NOTE — Telephone Encounter (Signed)
Message copied by Anselm Jungling on Wed Mar 14, 2012  1:16 PM ------      Message from: Josph Macho      Created: Tue Mar 13, 2012  8:50 PM       Call - labs are great.  pete

## 2012-03-20 ENCOUNTER — Encounter: Payer: Self-pay | Admitting: *Deleted

## 2012-03-20 ENCOUNTER — Other Ambulatory Visit: Payer: Self-pay | Admitting: *Deleted

## 2012-03-20 DIAGNOSIS — F329 Major depressive disorder, single episode, unspecified: Secondary | ICD-10-CM

## 2012-03-20 DIAGNOSIS — C50919 Malignant neoplasm of unspecified site of unspecified female breast: Secondary | ICD-10-CM

## 2012-03-20 MED ORDER — ESCITALOPRAM OXALATE 10 MG PO TABS: 10.0000 mg | ORAL_TABLET | Freq: Every day | ORAL | Status: DC

## 2012-03-20 NOTE — Progress Notes (Signed)
Pt called to get lab results from 03/12/12.  Per Dr. Myna Hidalgo, labs are great.  Pt voiced understanding.

## 2012-03-21 ENCOUNTER — Other Ambulatory Visit: Payer: Self-pay | Admitting: *Deleted

## 2012-03-21 DIAGNOSIS — F329 Major depressive disorder, single episode, unspecified: Secondary | ICD-10-CM

## 2012-03-21 DIAGNOSIS — C50919 Malignant neoplasm of unspecified site of unspecified female breast: Secondary | ICD-10-CM

## 2012-03-21 MED ORDER — ESCITALOPRAM OXALATE 10 MG PO TABS: 10.0000 mg | ORAL_TABLET | Freq: Every day | ORAL | Status: DC

## 2012-09-12 ENCOUNTER — Ambulatory Visit: Payer: Medicare Other | Admitting: Hematology & Oncology

## 2012-09-12 ENCOUNTER — Other Ambulatory Visit: Payer: Medicare Other | Admitting: Lab

## 2012-09-18 ENCOUNTER — Ambulatory Visit (HOSPITAL_BASED_OUTPATIENT_CLINIC_OR_DEPARTMENT_OTHER): Payer: Medicare Other | Admitting: Hematology & Oncology

## 2012-09-18 ENCOUNTER — Other Ambulatory Visit (HOSPITAL_BASED_OUTPATIENT_CLINIC_OR_DEPARTMENT_OTHER): Payer: Medicare Other | Admitting: Lab

## 2012-09-18 VITALS — BP 110/62 | HR 60 | Temp 97.9°F | Resp 16 | Ht 67.0 in | Wt 239.0 lb

## 2012-09-18 DIAGNOSIS — C50919 Malignant neoplasm of unspecified site of unspecified female breast: Secondary | ICD-10-CM

## 2012-09-18 DIAGNOSIS — Z853 Personal history of malignant neoplasm of breast: Secondary | ICD-10-CM

## 2012-09-18 DIAGNOSIS — Z9010 Acquired absence of unspecified breast and nipple: Secondary | ICD-10-CM

## 2012-09-18 DIAGNOSIS — M810 Age-related osteoporosis without current pathological fracture: Secondary | ICD-10-CM

## 2012-09-18 LAB — CBC WITH DIFFERENTIAL (CANCER CENTER ONLY)
BASO#: 0 10*3/uL (ref 0.0–0.2)
EOS%: 3.3 % (ref 0.0–7.0)
HCT: 44 % (ref 34.8–46.6)
HGB: 14.8 g/dL (ref 11.6–15.9)
LYMPH#: 1.4 10*3/uL (ref 0.9–3.3)
LYMPH%: 16.7 % (ref 14.0–48.0)
MCH: 32.3 pg (ref 26.0–34.0)
MCHC: 33.6 g/dL (ref 32.0–36.0)
MCV: 96 fL (ref 81–101)
MONO%: 7.2 % (ref 0.0–13.0)
NEUT%: 72.4 % (ref 39.6–80.0)
RDW: 12.8 % (ref 11.1–15.7)

## 2012-09-18 NOTE — Progress Notes (Signed)
This office note has been dictated.

## 2012-09-18 NOTE — Progress Notes (Signed)
CC:   Claude Manges, MD Angelia Mould. Derrell Lolling, M.D.  DIAGNOSIS:  Stage IIB (T2 N1 M0) ductal carcinoma of the right breast.  CURRENT THERAPY:  Observation.  INTERIM HISTORY:  Ms. Staff comes in for her followup.  She is doing okay.  We last saw her back in August of last year.  Since then she has had no issues.  There have been some family problems but she has a strong faith and she knows that God will take care of these problems.  She is trying to lose a little weight.  She is watching what she eats.  She is not on blood pressure medication right now.  She said this caused some leg swelling.  She has had no cough.  There has been no change in bowel or bladder habits.  She has had no rashes.  There has been no headache.  She has had a bilateral mastectomy so there is no indication for mammograms.  PHYSICAL EXAMINATION:  General:  This is an elderly, white female in no obvious distress.  Vital signs:  Show temperature of 97.9, pulse 60, respiratory rate 16, blood pressure 110/62.  Weight is 239.  Head and neck:  Shows a normocephalic, atraumatic skull.  There are no ocular or oral lesions.  There are no palpable cervical or supraclavicular lymph nodes.  Lungs:  Clear bilaterally.  Cardiac:  Regular rate and rhythm with a normal S1, S2.  There are no murmurs, rubs or bruits.  Chest wall:  Shows bilateral mastectomies.  She has no chest wall nodules or erythema.  There is no bilateral axillary adenopathy.  Abdomen:  Soft with good bowel sounds.  There is no palpable abdominal mass.  There is no fluid wave.  There is no palpable hepatosplenomegaly.  Extremities: Show some trace edema in her legs bilaterally.  Back:  Some slight kyphosis is noted.  Skin:  No rashes, ecchymoses or petechiae.  LABORATORY STUDIES:  White cell count 8.5, hemoglobin 14.8, hematocrit 44, platelet count 176.  IMPRESSION:  Ms. [name] is a 72 year old white female with history of stage IIB (T2 N1 M0)  ductal carcinoma of the right breast.  She was diagnosed back in 2004.  She completed adjuvant chemotherapy in June of 2004.  She was on Femara.  She completed Femara back in 2011.  So far, everything has done very well.  I think we can probably get her back in 6 months.  If everything looks good in 6 months, then I think we can probably get her back yearly.    ______________________________ Josph Macho, M.D. PRE/MEDQ  D:  09/18/2012  T:  09/18/2012  Job:  [id number]

## 2012-09-19 LAB — COMPREHENSIVE METABOLIC PANEL
AST: 19 U/L (ref 0–37)
BUN: 13 mg/dL (ref 6–23)
Calcium: 9 mg/dL (ref 8.4–10.5)
Chloride: 103 mEq/L (ref 96–112)
Creatinine, Ser: 0.66 mg/dL (ref 0.50–1.10)
Total Bilirubin: 0.7 mg/dL (ref 0.3–1.2)

## 2012-09-25 ENCOUNTER — Telehealth: Payer: Self-pay | Admitting: Hematology & Oncology

## 2012-09-25 NOTE — Telephone Encounter (Addendum)
Message copied by Cathi Roan on Tue Sep 25, 2012 11:58 AM ------      Message from: Arlan Organ R      Created: Wed Sep 19, 2012  5:27 PM       Call - labs are ok, but vit D is low!! How much is she taking?? If she is taking some, have her double the dose!!  Pete ------  09-25-12  Called and spoke to Pt. regarding above MD message. Pt. states she is taking 1 pill daily, per MD orders advised to increase to 2 daily. I will note the change on medication sheet. Lupita Raider LPN

## 2012-10-22 ENCOUNTER — Other Ambulatory Visit: Payer: Self-pay | Admitting: Physician Assistant

## 2012-10-22 NOTE — Telephone Encounter (Signed)
Ok to call in 51  / 0 refill.

## 2012-10-22 NOTE — Telephone Encounter (Signed)
Last OV 10/10/12.  Last refill this med 09/21/12. Need approval for controlled medication.

## 2012-11-20 ENCOUNTER — Other Ambulatory Visit: Payer: Self-pay | Admitting: Physician Assistant

## 2012-11-20 NOTE — Telephone Encounter (Signed)
Last refill 3/24.  Pt under cancer treatment. Hydrocodone/apap 5-325 BID prn #60 Need approval for controlled medication.

## 2012-11-20 NOTE — Telephone Encounter (Signed)
Approved. # 60/ one additional refill 

## 2012-11-21 NOTE — Telephone Encounter (Signed)
Medication refilled per protocol. 

## 2012-12-17 ENCOUNTER — Other Ambulatory Visit: Payer: Self-pay | Admitting: Hematology & Oncology

## 2012-12-21 ENCOUNTER — Telehealth: Payer: Self-pay | Admitting: Family Medicine

## 2012-12-21 MED ORDER — OMEPRAZOLE 20 MG PO CPDR: 20.0000 mg | DELAYED_RELEASE_CAPSULE | Freq: Two times a day (BID) | ORAL | Status: DC

## 2012-12-21 NOTE — Telephone Encounter (Signed)
Rx Refilled  

## 2013-01-22 ENCOUNTER — Other Ambulatory Visit: Payer: Self-pay | Admitting: Hematology & Oncology

## 2013-01-22 ENCOUNTER — Other Ambulatory Visit: Payer: Self-pay | Admitting: Physician Assistant

## 2013-01-24 NOTE — Telephone Encounter (Signed)
?  ok to refill °

## 2013-01-24 NOTE — Telephone Encounter (Signed)
Approved. 60 plus One additional refill.

## 2013-01-25 NOTE — Telephone Encounter (Signed)
RX called in .

## 2013-02-21 ENCOUNTER — Other Ambulatory Visit: Payer: Self-pay | Admitting: Hematology & Oncology

## 2013-03-19 ENCOUNTER — Other Ambulatory Visit: Payer: Medicare Other | Admitting: Lab

## 2013-03-19 ENCOUNTER — Ambulatory Visit: Payer: Medicare Other | Admitting: Hematology & Oncology

## 2013-03-21 ENCOUNTER — Other Ambulatory Visit: Payer: Self-pay | Admitting: Physician Assistant

## 2013-03-21 ENCOUNTER — Telehealth: Payer: Self-pay | Admitting: Hematology & Oncology

## 2013-03-21 NOTE — Telephone Encounter (Signed)
01/22/13 #60 last refill  Cancer pt until care at cancer center.  Refill appropriate #60 called in 0 refills.

## 2013-03-21 NOTE — Telephone Encounter (Signed)
Patient called and resch 03/19/13 missed apt for 04/16/13

## 2013-03-22 NOTE — Telephone Encounter (Signed)
Approved.  

## 2013-03-28 ENCOUNTER — Other Ambulatory Visit: Payer: Self-pay | Admitting: Hematology & Oncology

## 2013-04-15 ENCOUNTER — Telehealth: Payer: Self-pay | Admitting: Hematology & Oncology

## 2013-04-15 NOTE — Telephone Encounter (Signed)
Pt moved 9-16 to 9-22

## 2013-04-16 ENCOUNTER — Other Ambulatory Visit: Payer: Medicare Other | Admitting: Lab

## 2013-04-16 ENCOUNTER — Ambulatory Visit: Payer: Medicare Other | Admitting: Hematology & Oncology

## 2013-04-20 ENCOUNTER — Other Ambulatory Visit: Payer: Self-pay | Admitting: Physician Assistant

## 2013-04-22 ENCOUNTER — Other Ambulatory Visit (HOSPITAL_BASED_OUTPATIENT_CLINIC_OR_DEPARTMENT_OTHER): Payer: Medicare Other | Admitting: Lab

## 2013-04-22 ENCOUNTER — Ambulatory Visit (HOSPITAL_BASED_OUTPATIENT_CLINIC_OR_DEPARTMENT_OTHER): Payer: Medicare Other | Admitting: Hematology & Oncology

## 2013-04-22 VITALS — BP 121/78 | HR 61 | Temp 97.3°F | Resp 16 | Ht 67.0 in | Wt 235.0 lb

## 2013-04-22 DIAGNOSIS — C50919 Malignant neoplasm of unspecified site of unspecified female breast: Secondary | ICD-10-CM

## 2013-04-22 DIAGNOSIS — C50911 Malignant neoplasm of unspecified site of right female breast: Secondary | ICD-10-CM

## 2013-04-22 LAB — CBC WITH DIFFERENTIAL (CANCER CENTER ONLY)
Eosinophils Absolute: 0.3 10*3/uL (ref 0.0–0.5)
HCT: 41.1 % (ref 34.8–46.6)
LYMPH%: 30.2 % (ref 14.0–48.0)
MCHC: 32.8 g/dL (ref 32.0–36.0)
MCV: 99 fL (ref 81–101)
MONO#: 0.5 10*3/uL (ref 0.1–0.9)
NEUT%: 55.7 % (ref 39.6–80.0)
Platelets: 176 10*3/uL (ref 145–400)
RBC: 4.16 10*6/uL (ref 3.70–5.32)
WBC: 5.9 10*3/uL (ref 3.9–10.0)

## 2013-04-22 LAB — COMPREHENSIVE METABOLIC PANEL
ALT: 11 U/L (ref 0–35)
Albumin: 3.6 g/dL (ref 3.5–5.2)
CO2: 29 mEq/L (ref 19–32)
Calcium: 8.7 mg/dL (ref 8.4–10.5)
Creatinine, Ser: 0.65 mg/dL (ref 0.50–1.10)
Glucose, Bld: 87 mg/dL (ref 70–99)
Potassium: 4.1 mEq/L (ref 3.5–5.3)
Total Bilirubin: 0.5 mg/dL (ref 0.3–1.2)
Total Protein: 5.9 g/dL — ABNORMAL LOW (ref 6.0–8.3)

## 2013-04-22 NOTE — Telephone Encounter (Signed)
Ok to refill 

## 2013-04-22 NOTE — Telephone Encounter (Signed)
Med phoned in °

## 2013-04-22 NOTE — Progress Notes (Signed)
This office note has been dictated.

## 2013-04-22 NOTE — Telephone Encounter (Signed)
ok 

## 2013-04-29 ENCOUNTER — Other Ambulatory Visit: Payer: Self-pay | Admitting: Hematology & Oncology

## 2013-04-29 ENCOUNTER — Ambulatory Visit (INDEPENDENT_AMBULATORY_CARE_PROVIDER_SITE_OTHER): Payer: Medicare Other | Admitting: Physician Assistant

## 2013-04-29 ENCOUNTER — Encounter: Payer: Self-pay | Admitting: Physician Assistant

## 2013-04-29 VITALS — BP 130/76 | HR 60 | Temp 98.0°F | Resp 18 | Ht 63.25 in | Wt 235.0 lb

## 2013-04-29 DIAGNOSIS — L0291 Cutaneous abscess, unspecified: Secondary | ICD-10-CM

## 2013-04-29 MED ORDER — DOXYCYCLINE HYCLATE 100 MG PO TABS: 100.0000 mg | ORAL_TABLET | Freq: Two times a day (BID) | ORAL | Status: DC

## 2013-04-29 MED ORDER — SULFAMETHOXAZOLE-TMP DS 800-160 MG PO TABS: 1.0000 | ORAL_TABLET | Freq: Two times a day (BID) | ORAL | Status: DC

## 2013-04-29 NOTE — Progress Notes (Signed)
   Patient ID: [name] MRN: [id number], DOB: 09-09-1940, 72 y.o. Date of Encounter: 04/29/2013, 2:07 PM    Chief Complaint:  Chief Complaint  Patient presents with  . boil on left buttock    has already opened and is draining     HPI: 72 y.o. year old white female is here for evaluation of abscess on her left buttock. Says that it has already started to drain on its own.  Home Meds: See attached medication section for any medications that were entered at today's visit. The computer does not put those onto this list.The following list is a list of meds entered prior to today's visit.   Current Outpatient Prescriptions on File Prior to Visit  Medication Sig Dispense Refill  . Albuterol Sulfate (PROAIR HFA IN) Inhale into the lungs as needed.       Marland Kitchen alendronate (FOSAMAX) 70 MG tablet every 7 (seven) days.       . Cholecalciferol (VITAMIN D) 2000 UNITS tablet Take 2,000 Units by mouth daily. 09-25-12 pt was instructed to increase from 1000 to 2000 units daily      . escitalopram (LEXAPRO) 10 MG tablet TAKE ONE TABLET BY MOUTH ONCE DAILY  30 tablet  0  . HYDROcodone-acetaminophen (NORCO/VICODIN) 5-325 MG per tablet TAKE ONE TABLET BY MOUTH TWICE DAILY AS NEEDED FOR PAIN  60 tablet  0  . omeprazole (PRILOSEC) 20 MG capsule Take 1 capsule (20 mg total) by mouth 2 (two) times daily.  60 capsule  5  . vitamin B-12 (CYANOCOBALAMIN) 100 MCG tablet Take 50 mcg by mouth daily.        . vitamin E 100 UNIT capsule Take 100 Units by mouth daily.         No current facility-administered medications on file prior to visit.    Allergies:  Allergies  Allergen Reactions  . Penicillins Hives      Review of Systems: See HPI for pertinent ROS. All other ROS negative.    Physical Exam: Blood pressure 130/76, pulse 60, temperature 98 F (36.7 C), temperature source Oral, resp. rate 18, height 5' 3.25" (1.607 m), weight 235 lb (106.595 kg)., Body mass index is 41.28 kg/(m^2). General:  Overweight white female. Appears in no acute distress. Lungs: Clear bilaterally to auscultation without wheezes, rales, or rhonchi. Breathing is unlabored. Heart: Regular rhythm. No murmurs, rubs, or gallops. Msk:  Strength and tone normal for age. Skin: Medial aspect of Left Buttock: approx 0.5 inch area of erythema and firmness. This is already open and draining purulent drainage. Neuro: Alert and oriented X 3. Moves all extremities spontaneously. Gait is normal. CNII-XII grossly in tact. Psych:  Responds to questions appropriately with a normal affect.     ASSESSMENT AND PLAN:  72 y.o. yearar old female with  1. Abscess Culture sent. I Further drained the abscess. Take both antibiotics as prescribed. Followup if site worsens or does not resolve with completion of antibiotics. - sulfamethoxazole-trimethoprim (BACTRIM DS) 800-160 MG per tablet; Take 1 tablet by mouth 2 (two) times daily.  Dispense: 28 tablet; Refill: 0 - doxycycline (VIBRA-TABS) 100 MG tablet; Take 1 tablet (100 mg total) by mouth 2 (two) times daily.  Dispense: 20 tablet; Refill: 0 - Culture, routine-abscess   Signed, [address] [place], [place], [facility] 04/29/2013 2:07 PM

## 2013-04-30 NOTE — Progress Notes (Signed)
CC:   Claude Manges, MD Angelia Mould. Derrell Lolling, M.D.  DIAGNOSIS:  Stage IIB (T2 N1 M0) ductal carcinoma of the right breast.  CURRENT THERAPY:  Observation.  INTERIM HISTORY:  Ms. Ferrick comes in for followup.  We see her every 6 months.  She is doing well.  She has had no problems since we last saw her.  She has had no cough or shortness breath.  There has been no nausea or vomiting.  There has been no bony pain.  There has been no change in bowel or bladder habits.  She has had no headache.  She continues on her medications.  No new medications have been given.  She wanted to know if she needs a colonoscopy.  I told her that she does need a colonoscopy.  If she has one done this year, this would be the last one that she will need.  Her last one was back in 2004.  PHYSICAL EXAMINATION:  General:  This is a well-developed, well- nourished white female in no obvious distress.  Vital signs: Temperature of 97.3, pulse 61, respiratory rate 16, blood pressure 121/78.  Weight is 235.  Head and neck:  Normocephalic, atraumatic skull.  There are no ocular or oral lesions.  There are no palpable cervical or supraclavicular lymph nodes.  Lungs:  Clear bilaterally. Cardiac:  Regular rate and rhythm with a normal S1 and S2.  There are no murmurs, rubs or bruits.  Abdomen:  Soft.  She has good bowel sounds. She is mildly obese.  There is no fluid wave.  There is no palpable hepatosplenomegaly.  Breasts:  Bilateral mastectomies.  She has no chest wall masses.  There are no nodules.  There is no erythema.  There is no bilateral axillary adenopathy.  Extremities:  No clubbing, cyanosis or edema.  Neurologic:  No focal neurological deficit.  Back:  Some slight kyphosis.  LABORATORY STUDIES:  White cell count is 5.9, hemoglobin 13.5, hematocrit 41.1, platelet count 176.  IMPRESSION:  Ms. Szumski is a very charming 72 year old white female with a history of stage IIB (T2 N1 M0) infiltrating  ductal carcinoma of the right breast.  She underwent bilateral mastectomies.  Her tumor was estrogen receptor positive.  She received adjuvant chemotherapy.  This was completed in June of 2004.  She was on Femara.  Femara was completed back in 2011.  For now, we will get her back in 8 months.    ______________________________ Josph Macho, M.D. PRE/MEDQ  D:  04/22/2013  T:  04/30/2013  Job:  [id number]

## 2013-05-02 LAB — CULTURE, ROUTINE-ABSCESS: Gram Stain: NONE SEEN

## 2013-05-31 ENCOUNTER — Other Ambulatory Visit: Payer: Self-pay | Admitting: Hematology & Oncology

## 2013-06-03 ENCOUNTER — Telehealth: Payer: Self-pay | Admitting: *Deleted

## 2013-06-03 NOTE — Telephone Encounter (Signed)
Last refill 04/20/13  #60  OK refill Hydrocodone?

## 2013-06-03 NOTE — Telephone Encounter (Signed)
Approved  for #60 with 0 refills.

## 2013-06-04 MED ORDER — HYDROCODONE-ACETAMINOPHEN 5-325 MG PO TABS: 1.0000 | ORAL_TABLET | Freq: Two times a day (BID) | ORAL | Status: DC | PRN

## 2013-06-04 NOTE — Telephone Encounter (Addendum)
rx printed for signature  Pt aware rx ready

## 2013-06-12 ENCOUNTER — Other Ambulatory Visit: Payer: Self-pay | Admitting: Family Medicine

## 2013-07-03 ENCOUNTER — Other Ambulatory Visit: Payer: Self-pay | Admitting: Physician Assistant

## 2013-07-03 ENCOUNTER — Other Ambulatory Visit: Payer: Self-pay | Admitting: Hematology & Oncology

## 2013-07-03 ENCOUNTER — Telehealth: Payer: Self-pay | Admitting: Family Medicine

## 2013-07-03 NOTE — Telephone Encounter (Signed)
Call back number is [phone] Pt is needing a refill on her Hydrocodone and her Inhaler and the one for her bones(pharmacy is going to fax over for those two) [employer]

## 2013-07-03 NOTE — Telephone Encounter (Signed)
Medication refilled per protocol. 

## 2013-07-04 MED ORDER — ALBUTEROL SULFATE HFA 108 (90 BASE) MCG/ACT IN AERS: 2.0000 | INHALATION_SPRAY | Freq: Four times a day (QID) | RESPIRATORY_TRACT | Status: DC | PRN

## 2013-07-04 MED ORDER — HYDROCODONE-ACETAMINOPHEN 5-325 MG PO TABS: 1.0000 | ORAL_TABLET | Freq: Two times a day (BID) | ORAL | Status: DC | PRN

## 2013-07-04 MED ORDER — ALENDRONATE SODIUM 70 MG PO TABS: ORAL_TABLET | ORAL | Status: DC

## 2013-07-04 NOTE — Telephone Encounter (Signed)
RX printed, left up front and patient aware to pick up per vm 

## 2013-07-08 NOTE — Progress Notes (Signed)
CC:   Claude Manges, MD Angelia Mould. Derrell Lolling, M.D.  DIAGNOSIS:  Stage IIB (T2 N1 M0) ductal carcinoma of the right breast.  CURRENT THERAPY:  Observation.  INTERIM HISTORY:  Ms. Hottinger comes in for followup.  We see her every 6 months.  She is doing well.  She has had no problems since we last saw her.  She has had no cough or shortness breath.  There has been no nausea or vomiting.  There has been no bony pain.  There has been no change in bowel or bladder habits.  She has had no headache.  She continues on her medications.  No new medications have been given.  She is wondering why she needs a colonoscopy.  I told that she does need a colonoscopy.  If she has one done this year, this would be the last one that she will ever need.  Her last one was back in 2004.  PHYSICAL EXAMINATION:  General:  This is a well-developed, well- nourished white female in no obvious distress.  Vital Signs: Temperature of 97.3, pulse 61, respiratory rate 16, blood pressure 121/78.  Weight is 235.  Head and Neck:  Normocephalic, atraumatic skull.  There are no ocular or oral lesions.  There are no palpable cervical or supraclavicular lymph nodes.  Lungs:  Clear bilaterally. Cardiac:  Regular rate and rhythm with a normal S1, S2.  There are no murmurs, rubs or bruits.  Abdomen:  Soft.  She has good bowel sounds. She is mildly obese.  There is no fluid wave.  There is no palpable hepatosplenomegaly.  Breasts:  Bilateral mastectomies.  She has no chest wall masses.  There are  no nodules.  There is no erythema.  There is no bilateral axillary adenopathy.  Extremities:  Show no clubbing, cyanosis, or edema.  Neurological:  No focal neurological deficit. Back:  Shows some slight kyphosis.  LABORATORY STUDIES:  White cell count is 5.9, hemoglobin 13.5, hematocrit 41.1, platelet count 176.  IMPRESSION:  Ms. [place] is a very charming 72 year old white female with a history of stage IIB (T2 N1 M0)  infiltrating ductal carcinoma of the right breast.  She underwent bilateral mastectomies.  Her tumor was ER- positive.  She received adjuvant chemotherapy.  This was completed in June of 2004.  She was on Femara.  Femara was completed back in 2011.  For now, we will get her back in 8 months.    ______________________________ Josph Macho, M.D. PRE/MEDQ  D:  04/22/2013  T:  07/07/2013  Job:  [id number]

## 2013-07-18 ENCOUNTER — Ambulatory Visit (INDEPENDENT_AMBULATORY_CARE_PROVIDER_SITE_OTHER): Payer: Medicare Other | Admitting: Physician Assistant

## 2013-07-18 ENCOUNTER — Telehealth: Payer: Self-pay | Admitting: Family Medicine

## 2013-07-18 ENCOUNTER — Encounter: Payer: Self-pay | Admitting: Physician Assistant

## 2013-07-18 VITALS — BP 120/80 | HR 64 | Temp 97.9°F | Resp 18 | Wt 236.0 lb

## 2013-07-18 DIAGNOSIS — J209 Acute bronchitis, unspecified: Secondary | ICD-10-CM

## 2013-07-18 DIAGNOSIS — J988 Other specified respiratory disorders: Secondary | ICD-10-CM

## 2013-07-18 DIAGNOSIS — A499 Bacterial infection, unspecified: Secondary | ICD-10-CM

## 2013-07-18 MED ORDER — PREDNISONE 20 MG PO TABS: ORAL_TABLET | ORAL | Status: DC

## 2013-07-18 MED ORDER — ALBUTEROL SULFATE HFA 108 (90 BASE) MCG/ACT IN AERS: 2.0000 | INHALATION_SPRAY | Freq: Four times a day (QID) | RESPIRATORY_TRACT | Status: DC | PRN

## 2013-07-18 MED ORDER — AZITHROMYCIN 250 MG PO TABS: ORAL_TABLET | ORAL | Status: DC

## 2013-07-18 MED ORDER — HYDROCOD POLST-CHLORPHEN POLST 10-8 MG/5ML PO LQCR: 5.0000 mL | Freq: Two times a day (BID) | ORAL | Status: DC | PRN

## 2013-07-18 NOTE — Telephone Encounter (Signed)
Try Tessalon Perles 200 mg 1 by mouth each bedtime when necessary cough #30+0 refills

## 2013-07-18 NOTE — Progress Notes (Signed)
Patient ID: [name] MRN: [id number], DOB: 06/15/1941, 72 y.o. Date of Encounter: 07/18/2013, 9:16 AM    Chief Complaint:  Chief Complaint  Patient presents with  . cough x 5 days    greenish phlegm     HPI: 72 y.o. year old white female says that she has had a cough for about a week now. Says that for the past 5 days she's been getting up large amounts of green phlegm. She's also having some wheezing. Also had head and nasal congestion. Is getting out thick dark mucus from her nose as well. No significant sore throat or earache. No fevers or chills. Says that she had a coughing spell that lasted 45 minutes this morning. Says that her albuterol inhaler is running out and needs a refill.     Home Meds: See attached medication section for any medications that were entered at today's visit. The computer does not put those onto this list.The following list is a list of meds entered prior to today's visit.   Current Outpatient Prescriptions on File Prior to Visit  Medication Sig Dispense Refill  . alendronate (FOSAMAX) 70 MG tablet TAKE ONE TABLET BY MOUTH ONCE A WEEK IN THE MORNING WITH  A  GLASS  OF  WATER,  30  MINUTES  BEFORE  A  MEAL  OR  BEVERAGE.  REMAIN  UPRIGH  4 tablet  5  . Cholecalciferol (VITAMIN D) 2000 UNITS tablet Take 2,000 Units by mouth daily. 09-25-12 pt was instructed to increase from 1000 to 2000 units daily      . escitalopram (LEXAPRO) 10 MG tablet TAKE ONE TABLET BY MOUTH ONCE DAILY  30 tablet  0  . HYDROcodone-acetaminophen (NORCO/VICODIN) 5-325 MG per tablet Take 1 tablet by mouth 2 (two) times daily as needed.  60 tablet  0  . omeprazole (PRILOSEC) 20 MG capsule Take 1 capsule (20 mg total) by mouth 2 (two) times daily.  60 capsule  5  . vitamin B-12 (CYANOCOBALAMIN) 100 MCG tablet Take 50 mcg by mouth daily.        . vitamin E 100 UNIT capsule Take 100 Units by mouth daily.         No current facility-administered medications on file prior to visit.     Allergies:  Allergies  Allergen Reactions  . Penicillins Hives      Review of Systems: See HPI for pertinent ROS. All other ROS negative.    Physical Exam: Blood pressure 120/80, pulse 64, temperature 97.9 F (36.6 C), temperature source Oral, resp. rate 18, weight 236 lb (107.049 kg)., Body mass index is 41.45 kg/(m^2). General:  Obese white female .Appears in no acute distress. HEENT: Normocephalic, atraumatic, eyes without discharge, sclera non-icteric, nares are without discharge. Bilateral auditory canals clear, TM's are without perforation, pearly grey and translucent with reflective cone of light bilaterally. Oral cavity moist, posterior pharynx without exudate, erythema, peritonsillar abscess. No tenderness of sinuses with percussion.  Neck: Supple. No thyromegaly. No lymphadenopathy. Lungs: She has mild wheezes only at the end of expiration. I can hear some slight wheeze even at the upper aspect of the right lung. They are slightly more pronounced at the base of the right lung. On the left, only hear slight wheeze at the left base. None in the left upper lung. Heart: Regular rhythm. No murmurs, rubs, or gallops. Msk:  Strength and tone normal for age. Extremities/Skin: Warm and dry. No clubbing or cyanosis. No edema. No rashes or  suspicious lesions. Neuro: Alert and oriented X 3. Moves all extremities spontaneously. Gait is normal. CNII-XII grossly in tact. Psych:  Responds to questions appropriately with a normal affect.     ASSESSMENT AND PLAN:  72 y.o. year old female with  1. Bacterial respiratory infection - azithromycin (ZITHROMAX) 250 MG tablet; Day 1: Take 2 daily.  Days 2-5: Take 1 daily.  Dispense: 6 tablet; Refill: 0 - chlorpheniramine-HYDROcodone (TUSSIONEX) 10-8 MG/5ML LQCR; Take 5 mLs by mouth every 12 (twelve) hours as needed for cough.  Dispense: 115 mL; Refill: 0  2. Acute bronchitis - azithromycin (ZITHROMAX) 250 MG tablet; Day 1: Take 2 daily.  Days  2-5: Take 1 daily.  Dispense: 6 tablet; Refill: 0 - predniSONE (DELTASONE) 20 MG tablet; Take 3 daily for 2 days, then 2 daily for 2 days, then 1 daily for 2 days.  Dispense: 12 tablet; Refill: 0 - albuterol (PROAIR HFA) 108 (90 BASE) MCG/ACT inhaler; Inhale 2 puffs into the lungs every 6 (six) hours as needed.  Dispense: 1 Inhaler; Refill: 3 - chlorpheniramine-HYDROcodone (TUSSIONEX) 10-8 MG/5ML LQCR; Take 5 mLs by mouth every 12 (twelve) hours as needed for cough.  Dispense: 115 mL; Refill: 0  Follow up if symptoms worsen or do not resolve after completion of the above medications.  Signed, [address] [place], [place], [facility] 07/18/2013 9:16 AM

## 2013-07-18 NOTE — Telephone Encounter (Signed)
Cough medicine you ordered is $71.  Is there something less expensive that can be ordered?

## 2013-07-19 MED ORDER — BENZONATATE 200 MG PO CAPS
200.0000 mg | ORAL_CAPSULE | Freq: Two times a day (BID) | ORAL | Status: DC | PRN
Start: 2013-07-19 — End: 2013-12-20

## 2013-07-19 NOTE — Telephone Encounter (Signed)
Pt called and informed.

## 2013-07-29 ENCOUNTER — Telehealth: Payer: Self-pay | Admitting: Family Medicine

## 2013-07-29 NOTE — Telephone Encounter (Signed)
Pt would like a call back from you  Call back number is [phone]

## 2013-07-31 ENCOUNTER — Encounter: Payer: Self-pay | Admitting: Physician Assistant

## 2013-07-31 ENCOUNTER — Ambulatory Visit (INDEPENDENT_AMBULATORY_CARE_PROVIDER_SITE_OTHER): Payer: Medicare Other | Admitting: Physician Assistant

## 2013-07-31 VITALS — BP 150/94 | HR 68 | Temp 97.4°F | Resp 20 | Wt 235.0 lb

## 2013-07-31 DIAGNOSIS — J988 Other specified respiratory disorders: Secondary | ICD-10-CM

## 2013-07-31 DIAGNOSIS — J209 Acute bronchitis, unspecified: Secondary | ICD-10-CM

## 2013-07-31 DIAGNOSIS — A499 Bacterial infection, unspecified: Secondary | ICD-10-CM

## 2013-07-31 DIAGNOSIS — J45901 Unspecified asthma with (acute) exacerbation: Secondary | ICD-10-CM

## 2013-07-31 MED ORDER — LEVOFLOXACIN 750 MG PO TABS: 750.0000 mg | ORAL_TABLET | Freq: Every day | ORAL | Status: DC

## 2013-07-31 MED ORDER — PREDNISONE 20 MG PO TABS: ORAL_TABLET | ORAL | Status: DC

## 2013-07-31 NOTE — Progress Notes (Signed)
Patient ID: [name] MRN: [id number], DOB: 11-Jul-1941, 72 y.o. Date of Encounter: 07/31/2013, 11:34 AM    Chief Complaint:  Chief Complaint  Patient presents with  . still sick    chest getting tight again, hursts to cough, night sweats, wheezing bad at night     HPI: 72 y.o. year old obese white female is here for followup.  I saw her on 07/18/13. At that time she reported she had had a cough for about one week. She was coughing up large amounts of green phlegm. As well she was having some wheezing. Also is having some head and nasal congestion with thick dark mucus from her nose. At that visit I prescribed prednisone taper as well as a Z-Pak. Also she was to use her albuterol inhaler 4 times a day. She has taken all his medications as directed.  She says that overall she feels some better but she is still having wheezing every night. She says that when she goes to bed she has some wheezing and then as well during the night she sometimes wakes with wheezing. She still has some cough. She says that the phlegm may not be quite as dark but she is still having thick phlegm. As well she is still getting mucus from her nose. She says that she has continued using albuterol 4 times a day and did use this this morning prior to her visit.     Home Meds: See attached medication section for any medications that were entered at today's visit. The computer does not put those onto this list.The following list is a list of meds entered prior to today's visit.   Current Outpatient Prescriptions on File Prior to Visit  Medication Sig Dispense Refill  . albuterol (PROAIR HFA) 108 (90 BASE) MCG/ACT inhaler Inhale 2 puffs into the lungs every 6 (six) hours as needed.  1 Inhaler  3  . alendronate (FOSAMAX) 70 MG tablet TAKE ONE TABLET BY MOUTH ONCE A WEEK IN THE MORNING WITH  A  GLASS  OF  WATER,  30  MINUTES  BEFORE  A  MEAL  OR  BEVERAGE.  REMAIN  UPRIGH  4 tablet  5  . benzonatate (TESSALON)  200 MG capsule Take 1 capsule (200 mg total) by mouth 2 (two) times daily as needed for cough.  30 capsule  0  . Cholecalciferol (VITAMIN D) 2000 UNITS tablet Take 2,000 Units by mouth daily. 09-25-12 pt was instructed to increase from 1000 to 2000 units daily      . escitalopram (LEXAPRO) 10 MG tablet TAKE ONE TABLET BY MOUTH ONCE DAILY  30 tablet  0  . HYDROcodone-acetaminophen (NORCO/VICODIN) 5-325 MG per tablet Take 1 tablet by mouth 2 (two) times daily as needed.  60 tablet  0  . omeprazole (PRILOSEC) 20 MG capsule Take 1 capsule (20 mg total) by mouth 2 (two) times daily.  60 capsule  5  . vitamin B-12 (CYANOCOBALAMIN) 100 MCG tablet Take 50 mcg by mouth daily.        . vitamin E 100 UNIT capsule Take 100 Units by mouth daily.        Marland Kitchen azithromycin (ZITHROMAX) 250 MG tablet Day 1: Take 2 daily.  Days 2-5: Take 1 daily.  6 tablet  0  . chlorpheniramine-HYDROcodone (TUSSIONEX) 10-8 MG/5ML LQCR Take 5 mLs by mouth every 12 (twelve) hours as needed for cough.  115 mL  0   No current facility-administered medications on file prior  to visit.    Allergies:  Allergies  Allergen Reactions  . Penicillins Hives      Review of Systems: See HPI for pertinent ROS. All other ROS negative.    Physical Exam: Blood pressure 150/94, pulse 68, temperature 97.4 F (36.3 C), temperature source Oral, resp. rate 20, weight 235 lb (106.595 kg), SpO2 98.00%., Body mass index is 41.28 kg/(m^2). General: Obese white female. Appears in no acute distress. HEENT: Normocephalic, atraumatic, eyes without discharge, sclera non-icteric, nares are without discharge. Bilateral auditory canals clear, TM's are without perforation, pearly grey and translucent with reflective cone of light bilaterally. Oral cavity moist, posterior pharynx without exudate, erythema, peritonsillar abscess.  Neck: Supple. No thyromegaly. No lymphadenopathy. Lungs: She has mild wheezes scattered bilaterally throughout. Heart: Regular rhythm.  No murmurs, rubs, or gallops. Msk:  Strength and tone normal for age. Extremities/Skin: Warm and dry. No clubbing or cyanosis. No edema. No rashes or suspicious lesions. Neuro: Alert and oriented X 3. Moves all extremities spontaneously. Gait is normal. CNII-XII grossly in tact. Psych:  Responds to questions appropriately with a normal affect.     ASSESSMENT AND PLAN:  72 y.o. year old female with  1. Bacterial respiratory infection - levofloxacin (LEVAQUIN) 750 MG tablet; Take 1 tablet (750 mg total) by mouth daily.  Dispense: 10 tablet; Refill: 0  2. Asthma with acute exacerbation - predniSONE (DELTASONE) 20 MG tablet; Take 3 daily for 2 days, then 2 daily for 2 days, then 1 daily for 2 days.  Dispense: 12 tablet; Refill: 0  3. Acute bronchitis - predniSONE (DELTASONE) 20 MG tablet; Take 3 daily for 2 days, then 2 daily for 2 days, then 1 daily for 2 days.  Dispense: 12 tablet; Refill: 0  Will give Depo-Medrol 80 mg IM now. She will then start the oral prednisone taper tomorrow. She will start Levaquin. She will continue the albuterol 4 times a day for this upcoming week. Follow up if symptoms worsen or do not resolve.  [address] [place], [place], [facility] 07/31/2013 11:34 AM

## 2013-08-05 NOTE — Telephone Encounter (Signed)
Pt had questions answered at [place]

## 2013-08-16 ENCOUNTER — Other Ambulatory Visit: Payer: Self-pay | Admitting: Hematology & Oncology

## 2013-09-05 ENCOUNTER — Telehealth: Payer: Self-pay | Admitting: Family Medicine

## 2013-09-05 NOTE — Telephone Encounter (Signed)
ok 

## 2013-09-05 NOTE — Telephone Encounter (Signed)
?   OK to Refill Last refill 07/04/2013

## 2013-09-05 NOTE — Telephone Encounter (Signed)
Call back number is [phone] Pt is needing her  HYDROcodone-acetaminophen (NORCO/VICODIN) 5-325 MG per tablet

## 2013-09-06 MED ORDER — HYDROCODONE-ACETAMINOPHEN 5-325 MG PO TABS: 1.0000 | ORAL_TABLET | Freq: Two times a day (BID) | ORAL | Status: DC | PRN

## 2013-09-06 NOTE — Telephone Encounter (Signed)
RX printed, left up front and patient aware to pick up  

## 2013-09-16 ENCOUNTER — Other Ambulatory Visit: Payer: Self-pay | Admitting: Hematology & Oncology

## 2013-10-23 ENCOUNTER — Other Ambulatory Visit: Payer: Self-pay | Admitting: Hematology & Oncology

## 2013-10-23 ENCOUNTER — Telehealth: Payer: Self-pay | Admitting: Family Medicine

## 2013-10-23 NOTE — Telephone Encounter (Signed)
?   OK to Refill pain medication - will submit PA for omeprazole.

## 2013-10-23 NOTE — Telephone Encounter (Signed)
Call back number is [phone] Pt is needing a refill on her HYDROcodone-acetaminophen (NORCO/VICODIN) 5-325 MG per tablet Last Refill 09/06/13 ---pt also states that her insurance is not going to pay for her stomach medication and she is wanting to have something else. If any questions he said to please call her

## 2013-10-24 ENCOUNTER — Telehealth: Payer: Self-pay | Admitting: Family Medicine

## 2013-10-24 MED ORDER — AMOXICILLIN 875 MG PO TABS: 875.0000 mg | ORAL_TABLET | Freq: Two times a day (BID) | ORAL | Status: DC

## 2013-10-24 NOTE — Telephone Encounter (Signed)
Rx to pharmacy.  Left pt message with provider recommendations

## 2013-10-24 NOTE — Telephone Encounter (Signed)
amox [id number] bid for 10 days. 

## 2013-10-24 NOTE — Telephone Encounter (Signed)
C/O sinus infection.  A lot of sinus pressure and facial pain.  Granddaughter getting married this week, can not come in.  Can you please call something in.  Taking OTC Mucinex.  Green nasal drainage

## 2013-10-24 NOTE — Telephone Encounter (Signed)
ok 

## 2013-10-25 MED ORDER — HYDROCODONE-ACETAMINOPHEN 5-325 MG PO TABS: 1.0000 | ORAL_TABLET | Freq: Two times a day (BID) | ORAL | Status: DC | PRN

## 2013-10-25 NOTE — Telephone Encounter (Signed)
RX printed, left up front and patient aware to pick up  PA approved through 07/31/14 - pt aware of approval

## 2013-11-27 ENCOUNTER — Other Ambulatory Visit: Payer: Self-pay | Admitting: Hematology & Oncology

## 2013-12-18 ENCOUNTER — Telehealth: Payer: Self-pay | Admitting: Family Medicine

## 2013-12-18 MED ORDER — HYDROCODONE-ACETAMINOPHEN 5-325 MG PO TABS: 1.0000 | ORAL_TABLET | Freq: Two times a day (BID) | ORAL | Status: DC | PRN

## 2013-12-18 NOTE — Telephone Encounter (Signed)
Left patient message Rx ready for pick up

## 2013-12-18 NOTE — Telephone Encounter (Signed)
Last RF 10/25/13 #60.  Last OV?  Other then some sick visits?  No recent labs or routine OV?  OK refill?

## 2013-12-18 NOTE — Telephone Encounter (Signed)
[phone] Patient needs refill on her hydrocodone

## 2013-12-18 NOTE — Telephone Encounter (Signed)
She had CMET, CBC 04/22/13.  Ok to refill -- # 60 + 0.

## 2013-12-20 ENCOUNTER — Encounter: Payer: Self-pay | Admitting: Hematology & Oncology

## 2013-12-20 ENCOUNTER — Other Ambulatory Visit (HOSPITAL_BASED_OUTPATIENT_CLINIC_OR_DEPARTMENT_OTHER): Payer: Medicare Other | Admitting: Lab

## 2013-12-20 ENCOUNTER — Ambulatory Visit (HOSPITAL_BASED_OUTPATIENT_CLINIC_OR_DEPARTMENT_OTHER): Payer: Medicare Other | Admitting: Hematology & Oncology

## 2013-12-20 VITALS — BP 130/64 | HR 58 | Temp 97.8°F | Resp 14 | Ht 63.0 in | Wt 242.0 lb

## 2013-12-20 DIAGNOSIS — Z853 Personal history of malignant neoplasm of breast: Secondary | ICD-10-CM

## 2013-12-20 DIAGNOSIS — C50919 Malignant neoplasm of unspecified site of unspecified female breast: Secondary | ICD-10-CM

## 2013-12-20 DIAGNOSIS — C50911 Malignant neoplasm of unspecified site of right female breast: Secondary | ICD-10-CM

## 2013-12-20 LAB — CBC WITH DIFFERENTIAL (CANCER CENTER ONLY)
BASO#: 0 10*3/uL (ref 0.0–0.2)
BASO%: 0.5 % (ref 0.0–2.0)
EOS ABS: 0.3 10*3/uL (ref 0.0–0.5)
EOS%: 5.3 % (ref 0.0–7.0)
HEMATOCRIT: 43.5 % (ref 34.8–46.6)
HEMOGLOBIN: 14.4 g/dL (ref 11.6–15.9)
LYMPH#: 1.5 10*3/uL (ref 0.9–3.3)
LYMPH%: 25.1 % (ref 14.0–48.0)
MCH: 32.4 pg (ref 26.0–34.0)
MCHC: 33.1 g/dL (ref 32.0–36.0)
MCV: 98 fL (ref 81–101)
MONO#: 0.5 10*3/uL (ref 0.1–0.9)
MONO%: 8.2 % (ref 0.0–13.0)
NEUT#: 3.6 10*3/uL (ref 1.5–6.5)
NEUT%: 60.9 % (ref 39.6–80.0)
Platelets: 146 10*3/uL (ref 145–400)
RBC: 4.44 10*6/uL (ref 3.70–5.32)
RDW: 12.5 % (ref 11.1–15.7)
WBC: 5.9 10*3/uL (ref 3.9–10.0)

## 2013-12-20 NOTE — Progress Notes (Signed)
Hematology and Oncology Follow Up Visit  [name] [id number] March 26, 1941 73 y.o. 12/20/2013   Principle Diagnosis:  Stage IIB (T2 N1 M0) ductal carcinoma of the right breast.  Current Therapy:    Observation     Interim History:  Ms.  Figueroa is comes in for followup. [place] back in September. Cystic nausea and doing fairly well. She's had no problems since her last saw her. She's gained a little weight. Says this was because one of her granddaughters got married and she ate quite a bitwith all the ceremonies and parties.  She's had no problems with bony pain. There's been no cough. Been no change in bowel or bladder habits. I talked to her last time about her about getting a colonoscopy. I did talk to her about this. She says that she'll have this done.  She's had no rashes. There's been no leg swelling.  Medications: Current outpatient prescriptions:albuterol (PROAIR HFA) 108 (90 BASE) MCG/ACT inhaler, Inhale 2 puffs into the lungs every 6 (six) hours as needed., Disp: 1 Inhaler, Rfl: 3;  alendronate (FOSAMAX) 70 MG tablet, TAKE ONE TABLET BY MOUTH ONCE A WEEK IN THE MORNING WITH  A  GLASS  OF  WATER,  30  MINUTES  BEFORE  A  MEAL  OR  BEVERAGE.  REMAIN  UPRIGH, Disp: 4 tablet, Rfl: 5 Cholecalciferol (VITAMIN D) 2000 UNITS tablet, Take 2,000 Units by mouth daily. 09-25-12 pt was instructed to increase from 1000 to 2000 units daily, Disp: , Rfl: ;  escitalopram (LEXAPRO) 10 MG tablet, TAKE ONE TABLET BY MOUTH ONCE DAILY, Disp: 30 tablet, Rfl: 0;  HYDROcodone-acetaminophen (NORCO/VICODIN) 5-325 MG per tablet, Take 1 tablet by mouth 2 (two) times daily as needed., Disp: 60 tablet, Rfl: 0 omeprazole (PRILOSEC) 20 MG capsule, Take 1 capsule (20 mg total) by mouth 2 (two) times daily., Disp: 60 capsule, Rfl: 5;  vitamin B-12 (CYANOCOBALAMIN) 100 MCG tablet, Take 50 mcg by mouth daily.  , Disp: , Rfl: ;  vitamin E 100 UNIT capsule, Take 100 Units by mouth daily.  , Disp: , Rfl: ;   chlorpheniramine-HYDROcodone (TUSSIONEX) 10-8 MG/5ML LQCR, Take 5 mLs by mouth every 12 (twelve) hours as needed for cough., Disp: 115 mL, Rfl: 0  Allergies:  Allergies  Allergen Reactions  . Penicillins Hives    Past Medical History, Surgical history, Social history, and Family History were reviewed and updated.  Review of Systems: As above  Physical Exam:  height is 5\' 3"  (1.6 m) and weight is 242 lb (109.77 kg). Her oral temperature is 97.8 F (36.6 C). Her blood pressure is 130/64 and her pulse is 58. Her respiration is 14.   Somewhat obese white female. Her lungs are clear. Cardiac exam regular in rhythm. Chest positive for bilateral mastectomies. No bilateral axillary adenopathy is noted. Abdomen soft. Mildly obese. There is no palpable liver or spleen tip. Back exam no tenderness over the spine ribs or hips. Extremities shows no clubbing cyanosis or edema. She has some chronic mild non-pitting edema in the lower legs. Skin exam no rashes.  Lab Results  Component Value Date   WBC 5.9 12/20/2013   HGB 14.4 12/20/2013   HCT 43.5 12/20/2013   MCV 98 12/20/2013   PLT 146 12/20/2013     Chemistry      Component Value Date/Time   NA 141 04/22/2013 0814   K 4.1 04/22/2013 0814   CL 106 04/22/2013 0814   CO2 29 04/22/2013 0814   BUN  13 04/22/2013 0814   CREATININE 0.65 04/22/2013 0814      Component Value Date/Time   CALCIUM 8.7 04/22/2013 0814   ALKPHOS 45 04/22/2013 0814   AST 16 04/22/2013 0814   ALT 11 04/22/2013 0814   BILITOT 0.5 04/22/2013 0814         Impression and Plan: [name] is 73 year old white female with a history of stage to be dictating the carcinoma the right breast. She did receive some adjuvant chemotherapy. She had chemotherapy that was completed back in June of 2004.  We will go ahead and him back in another 8 months or so.     Volanda Napoleon, MD 5/22/20159:07 AM

## 2013-12-21 LAB — COMPREHENSIVE METABOLIC PANEL
ALBUMIN: 4 g/dL (ref 3.5–5.2)
ALK PHOS: 51 U/L (ref 39–117)
ALT: 14 U/L (ref 0–35)
AST: 21 U/L (ref 0–37)
BUN: 9 mg/dL (ref 6–23)
CO2: 29 mEq/L (ref 19–32)
Calcium: 9.2 mg/dL (ref 8.4–10.5)
Chloride: 105 mEq/L (ref 96–112)
Creatinine, Ser: 0.64 mg/dL (ref 0.50–1.10)
Glucose, Bld: 92 mg/dL (ref 70–99)
Potassium: 4.4 mEq/L (ref 3.5–5.3)
Sodium: 143 mEq/L (ref 135–145)
Total Bilirubin: 0.6 mg/dL (ref 0.2–1.2)
Total Protein: 6.1 g/dL (ref 6.0–8.3)

## 2013-12-21 LAB — VITAMIN D 25 HYDROXY (VIT D DEFICIENCY, FRACTURES): VIT D 25 HYDROXY: 47 ng/mL (ref 30–89)

## 2013-12-25 ENCOUNTER — Telehealth: Payer: Self-pay | Admitting: *Deleted

## 2013-12-25 NOTE — Telephone Encounter (Signed)
Message copied by Rico Ala on Wed Dec 25, 2013  1:16 PM ------      Message from: Volanda Napoleon      Created: Sun Dec 22, 2013 10:15 AM       Please call and let her not her labs and vitamin D are okay. Thanks. Pete ------

## 2013-12-25 NOTE — Telephone Encounter (Signed)
Called patient to let her know that her vitamin d and labs are all ok per dr. Marin Olp

## 2014-01-01 ENCOUNTER — Other Ambulatory Visit: Payer: Self-pay | Admitting: Hematology & Oncology

## 2014-01-07 ENCOUNTER — Other Ambulatory Visit: Payer: Self-pay | Admitting: Family Medicine

## 2014-02-03 ENCOUNTER — Other Ambulatory Visit: Payer: Self-pay | Admitting: Hematology & Oncology

## 2014-02-04 ENCOUNTER — Telehealth: Payer: Self-pay | Admitting: Family Medicine

## 2014-02-04 MED ORDER — HYDROCODONE-ACETAMINOPHEN 5-325 MG PO TABS: 1.0000 | ORAL_TABLET | Freq: Two times a day (BID) | ORAL | Status: DC | PRN

## 2014-02-04 NOTE — Telephone Encounter (Signed)
Ok to refill??  Last office visit 07/14/2013.  Last refill 12/18/2013.

## 2014-02-04 NOTE — Telephone Encounter (Signed)
ok 

## 2014-02-04 NOTE — Telephone Encounter (Signed)
[phone] Patient is asking for hycodone rx to be picked up today if possible she is going out of town

## 2014-02-04 NOTE — Telephone Encounter (Signed)
Prescription printed and patient made aware to come to office to pick up per VM.  

## 2014-03-11 ENCOUNTER — Ambulatory Visit (INDEPENDENT_AMBULATORY_CARE_PROVIDER_SITE_OTHER): Payer: Medicare Other | Admitting: Family Medicine

## 2014-03-11 ENCOUNTER — Encounter: Payer: Self-pay | Admitting: Family Medicine

## 2014-03-11 VITALS — BP 132/76 | HR 72 | Temp 97.3°F | Resp 14 | Ht 62.0 in | Wt 241.0 lb

## 2014-03-11 DIAGNOSIS — M542 Cervicalgia: Secondary | ICD-10-CM

## 2014-03-11 DIAGNOSIS — J0100 Acute maxillary sinusitis, unspecified: Secondary | ICD-10-CM

## 2014-03-11 MED ORDER — LEVOFLOXACIN 500 MG PO TABS: 500.0000 mg | ORAL_TABLET | Freq: Every day | ORAL | Status: DC

## 2014-03-11 MED ORDER — HYDROCODONE-ACETAMINOPHEN 5-325 MG PO TABS: 1.0000 | ORAL_TABLET | Freq: Two times a day (BID) | ORAL | Status: DC | PRN

## 2014-03-11 NOTE — Progress Notes (Signed)
Patient ID: [name], female   DOB: 1940/12/26, 73 y.o.   MRN: [id number]   Subjective:    Patient ID: [name], female    DOB: 01/09/1941, 73 y.o.   MRN: [id number]  Patient presents for Sinus Infection  patient here with sinus pressure sinus headache mild drainage for the past 2 months. She initially thought she would get over it or was a change in the weather but it has persisted. It feels like her previous sinus infections. She also had some right-sided ear pain and had some pain in her right neck that would come sharp when she turned her head she denies any change in her vision denies any tingling and numbness into her hand or arm. She denies any chest pain or shortness of breath. She does have albuterol at home which she uses when she gets wheezy she does have a history of asthma she has not required this recently. She does use nasal saline at times. She also takes hydrocodone which helps her arthritis which is severe is also helps her neck pain She works at a daycare   [place]:  GEN- denies fatigue, fever, weight loss,weakness, recent illness HEENT- denies eye drainage, change in vision, +nasal discharge, CVS- denies chest pain, palpitations RESP- denies SOB, cough, wheeze ABD- denies N/V, change in stools, abd pain GU- denies dysuria, hematuria, dribbling, incontinence MSK- + joint pain, muscle aches, injury Neuro- denies headache, dizziness, syncope, seizure activity       Objective:    BP 132/76  Pulse 72  Temp(Src) 97.3 F (36.3 C) (Oral)  Resp 14  Ht 5\' 2"  (1.575 m)  Wt 241 lb (109.317 kg)  BMI 44.07 kg/m2 GEN- NAD, alert and oriented x3, non toxic appearing HEENT- PERRL, EOMI, non injected sclera, pink conjunctiva, MMM, oropharynx mild injection,+post nasal drip TM clear bilat no effusion, + maxillary sinus tenderness,+Nasal drainage .+ clear drainage from right eye, TM small amount of clear fluid right side, left TM clear Neck- Supple, no  LAD, fair ROM, neg kernigs CVS- RRR, no murmur RESP-CTAB EXT- No edema Pulses- Radial 2+         Assessment & Plan:      Problem List Items Addressed This Visit   None    Visit Diagnoses   Acute maxillary sinusitis, recurrence not specified    -  Primary    based on history treat with levaquin, nasal saline, mucinex    Relevant Medications       levofloxacin (LEVAQUIN) tablet    Neck pain        I think this mostly her OA which is severe, no sign of meningitis, no radicular symptoms, no torticollis, norco refilled       Note: This dictation was prepared with Dragon dictation along with smaller phrase technology. Any transcriptional errors that result from this process are unintentional.

## 2014-03-11 NOTE — Patient Instructions (Signed)
Take antibiotics, Mucinex, use nasal saline Pain medication as needed  Call if not improved

## 2014-03-19 ENCOUNTER — Other Ambulatory Visit: Payer: Self-pay

## 2014-03-19 MED ORDER — ESCITALOPRAM OXALATE 10 MG PO TABS: 10.0000 mg | ORAL_TABLET | Freq: Every day | ORAL | Status: DC

## 2014-03-20 ENCOUNTER — Telehealth: Payer: Self-pay | Admitting: Family Medicine

## 2014-03-20 DIAGNOSIS — J209 Acute bronchitis, unspecified: Secondary | ICD-10-CM

## 2014-03-20 NOTE — Telephone Encounter (Signed)
[phone]  PT is feeling better however she is still having inter ear problems she did finish the antibiotic   She also stated that the side of head is not hurting like it was but it is still sore.   [name]

## 2014-03-20 NOTE — Telephone Encounter (Signed)
Call placed to patient to inquire about symptoms.   [place].

## 2014-03-21 NOTE — Telephone Encounter (Signed)
Call placed to patient. LMTRC.  

## 2014-03-22 NOTE — Telephone Encounter (Signed)
Call placed to patient. LMTRC.  

## 2014-03-24 MED ORDER — ALBUTEROL SULFATE HFA 108 (90 BASE) MCG/ACT IN AERS: 2.0000 | INHALATION_SPRAY | Freq: Four times a day (QID) | RESPIRATORY_TRACT | Status: DC | PRN

## 2014-03-24 NOTE — Telephone Encounter (Signed)
Start with proair inhaler, NTBS if still sick.

## 2014-03-24 NOTE — Telephone Encounter (Signed)
Received return call from patient.   Reports that she was treated for sinus infection with Levaquin, and she is feeling. Reports that her sinus pressure and headache have resolved, but she now has cough and feels like she continues to have chest congestion.   Requested to have a few more days of ABTx for bronchitis and requested refill on ProAir inhaler.   MD please advise.

## 2014-03-24 NOTE — Telephone Encounter (Signed)
Prescription sent to pharmacy.   Call placed to patient. LMTRC.  

## 2014-03-25 NOTE — Telephone Encounter (Signed)
Call placed to patient. LMTRC.  

## 2014-03-25 NOTE — Telephone Encounter (Signed)
Patient returned call and made aware.

## 2014-04-17 ENCOUNTER — Telehealth: Payer: Self-pay | Admitting: Family Medicine

## 2014-04-17 MED ORDER — HYDROCODONE-ACETAMINOPHEN 5-325 MG PO TABS: 1.0000 | ORAL_TABLET | Freq: Two times a day (BID) | ORAL | Status: DC | PRN

## 2014-04-17 NOTE — Telephone Encounter (Signed)
Patient requesting rx refill on her hydrocodone  Please call her at [phone]

## 2014-04-17 NOTE — Telephone Encounter (Signed)
RX printed, left up front and patient aware to pick up  

## 2014-04-17 NOTE — Telephone Encounter (Signed)
ok 

## 2014-04-17 NOTE — Telephone Encounter (Signed)
?   OK to Refill  

## 2014-05-08 ENCOUNTER — Telehealth: Payer: Self-pay | Admitting: Family Medicine

## 2014-05-08 NOTE — Telephone Encounter (Signed)
[phone]   Pt is needing to speak to someone about the infection that is on side of her head, ear, and neck. IT doesn't hurt all the time but especially when she turns her head. She was seen by Dr Buelah Manis about this recently.

## 2014-05-09 NOTE — Telephone Encounter (Signed)
Call placed to patient. LMTRC.  

## 2014-05-12 NOTE — Telephone Encounter (Signed)
Call placed to patient. No answer. VM full. 

## 2014-05-12 NOTE — Telephone Encounter (Signed)
Pt was seen 2 months ago for this, needs an OV

## 2014-05-12 NOTE — Telephone Encounter (Signed)
Call placed to patient to inquire.   Patient reports that she was seen in office for R side facial pain and sinus pressure. Reports that she also had some R ear pain and MD stated that she had some fluid build-up in ear.   Reports that ABTx was given and she was also taking Mucinex. States that she appeared to get better for a bit.   Reports that she now has increased nasal and sinus congestion on R side and she has pain behind her R ear when she turns her head. Denies dizziness.   MD please advise.

## 2014-05-13 NOTE — Telephone Encounter (Signed)
Call placed to patient. No answer. VM full. 

## 2014-05-14 NOTE — Telephone Encounter (Signed)
Multiple attempts made to reach patient with no answer and no return call.   Message to be closed.

## 2014-05-14 NOTE — Telephone Encounter (Signed)
Call placed to patient. No answer. VM full. 

## 2014-05-19 ENCOUNTER — Ambulatory Visit (INDEPENDENT_AMBULATORY_CARE_PROVIDER_SITE_OTHER): Payer: Medicare Other | Admitting: Family Medicine

## 2014-05-19 ENCOUNTER — Encounter: Payer: Self-pay | Admitting: Family Medicine

## 2014-05-19 VITALS — BP 128/74 | HR 68 | Temp 97.6°F | Resp 14 | Ht 64.0 in | Wt 235.0 lb

## 2014-05-19 DIAGNOSIS — M150 Primary generalized (osteo)arthritis: Secondary | ICD-10-CM

## 2014-05-19 DIAGNOSIS — M1990 Unspecified osteoarthritis, unspecified site: Secondary | ICD-10-CM | POA: Insufficient documentation

## 2014-05-19 DIAGNOSIS — M8949 Other hypertrophic osteoarthropathy, multiple sites: Secondary | ICD-10-CM

## 2014-05-19 DIAGNOSIS — J440 Chronic obstructive pulmonary disease with acute lower respiratory infection: Secondary | ICD-10-CM

## 2014-05-19 DIAGNOSIS — J0100 Acute maxillary sinusitis, unspecified: Secondary | ICD-10-CM

## 2014-05-19 DIAGNOSIS — M159 Polyosteoarthritis, unspecified: Secondary | ICD-10-CM

## 2014-05-19 MED ORDER — LEVOFLOXACIN 500 MG PO TABS: 500.0000 mg | ORAL_TABLET | Freq: Every day | ORAL | Status: DC

## 2014-05-19 MED ORDER — PREDNISONE 20 MG PO TABS: ORAL_TABLET | ORAL | Status: DC

## 2014-05-19 MED ORDER — HYDROCODONE-ACETAMINOPHEN 5-325 MG PO TABS: 1.0000 | ORAL_TABLET | Freq: Two times a day (BID) | ORAL | Status: DC | PRN

## 2014-05-19 NOTE — Patient Instructions (Signed)
Take antibiotics as prescribed Take prednisone Use albuterol as needed mucinex DM F/U 2 weeks

## 2014-05-19 NOTE — Assessment & Plan Note (Signed)
norco refilled  

## 2014-05-19 NOTE — Progress Notes (Signed)
Patient ID: [name], female   DOB: [birth year], 73 y.o.   MRN: [id number]   Subjective:    Patient ID: [name], female    DOB: 1941-02-24, 73 y.o.   MRN: [id number]  Patient presents for Sinsus Infection  Patient here with cough with production worsened over the past 3-4 days she's also had some sinus drainage and pressure. Subjective fever and chills. She's had wheezing worse at night time and she's felt a little short of breath. She has been using her albuterol inhaler which helps. Using nasal saline No chest pain Remote smoker Request refill on pain meds Review Of Systems:  GEN- denies fatigue,+ fever, weight loss,weakness, recent illness HEENT- denies eye drainage, change in vision, nasal discharge, CVS- denies chest pain, palpitations RESP- + SOB, +cough, +wheeze ABD- denies N/V, change in stools, abd pain Neuro- denies headache, dizziness, syncope, seizure activity       Objective:    BP 128/74  Pulse 68  Temp(Src) 97.6 F (36.4 C) (Oral)  Resp 14  Ht 5\' 4"  (1.626 m)  Wt 235 lb (106.595 kg)  BMI 40.32 kg/m2  SpO2 95% GEN- NAD, alert and oriented x3 HEENT- PERRL, EOMI, non injected sclera, pink conjunctiva, MMM, oropharynx clear, +maxillary sinus tenderness, nares clear, TM clear no effusion Neck- Supple, no LAD CVS- RRR, no murmur RESP-mild rales on right side, clears some with cough, occasional expiratory wheeze, normal WOB at rest, no retractions EXT- No edema Pulses- Radial 2+        Assessment & Plan:      Problem List Items Addressed This Visit   None    Visit Diagnoses   Bronchitis, chronic obstructive w acute bronchitis    -  Primary    history of chronic asthma/bronchitis, acute exacerbation concern for early bacterial infilrate based on exam, will cover with antibiotics, prednisone, continue albuterol, given mucinex DM from office     Relevant Medications       predniSONE (DELTASONE) tablet    Acute maxillary sinusitis,  recurrence not specified        nasal saline    Relevant Medications       predniSONE (DELTASONE) tablet       levofloxacin (LEVAQUIN) tablet       Note: This dictation was prepared with Dragon dictation along with smaller phrase technology. Any transcriptional errors that result from this process are unintentional.

## 2014-05-20 ENCOUNTER — Ambulatory Visit: Payer: Medicare Other | Admitting: Family Medicine

## 2014-05-26 ENCOUNTER — Telehealth: Payer: Self-pay | Admitting: Family Medicine

## 2014-05-26 NOTE — Telephone Encounter (Signed)
Call placed to patient with no answer.   VM full.

## 2014-05-26 NOTE — Telephone Encounter (Signed)
Patient is calling to say that she is still having wheezing and side pain, would like to know what she needs to do please call her at  [phone]

## 2014-05-26 NOTE — Telephone Encounter (Signed)
I will forward to Dr. Buelah Manis as she saw her for the problem.  I am not aware of the situation.

## 2014-05-26 NOTE — Telephone Encounter (Signed)
Have pt go get CXR today, to evaluate for pneumonia Use the albuterol as prescribed

## 2014-05-26 NOTE — Telephone Encounter (Signed)
Patient returned call and made aware.

## 2014-05-27 ENCOUNTER — Other Ambulatory Visit: Payer: Self-pay | Admitting: *Deleted

## 2014-05-27 ENCOUNTER — Ambulatory Visit
Admission: RE | Admit: 2014-05-27 | Discharge: 2014-05-27 | Disposition: A | Discharge status: Home / Self Care | Payer: Medicare Other | Source: Ambulatory Visit | Attending: Family Medicine | Admitting: Family Medicine

## 2014-05-27 ENCOUNTER — Telehealth: Payer: Self-pay | Admitting: *Deleted

## 2014-05-27 DIAGNOSIS — R059 Cough, unspecified: Secondary | ICD-10-CM

## 2014-05-27 DIAGNOSIS — R05 Cough: Secondary | ICD-10-CM

## 2014-05-27 MED ORDER — PREDNISONE 10 MG PO TABS: ORAL_TABLET | ORAL | Status: DC

## 2014-05-27 NOTE — Telephone Encounter (Signed)
Received call from [place], tech at [employer].   Reports that patient present for CXR, but she cannot see order.   Order placed and faxed to [place].

## 2014-06-18 ENCOUNTER — Telehealth: Payer: Self-pay | Admitting: Family Medicine

## 2014-06-18 MED ORDER — HYDROCODONE-ACETAMINOPHEN 5-325 MG PO TABS: 1.0000 | ORAL_TABLET | Freq: Two times a day (BID) | ORAL | Status: DC | PRN

## 2014-06-18 NOTE — Telephone Encounter (Signed)
RX printed, left up front - tried to call pt and mailbox is full

## 2014-06-18 NOTE — Telephone Encounter (Signed)
?   OK to Refill - last refill 05/19/2014

## 2014-06-18 NOTE — Telephone Encounter (Signed)
Okay to refill? 

## 2014-06-18 NOTE — Telephone Encounter (Signed)
[phone]  Pt is needing a refill on HYDROcodone-acetaminophen (NORCO/VICODIN) 5-325 MG per tablet leave a message if she doesn't answer

## 2014-07-01 ENCOUNTER — Ambulatory Visit (INDEPENDENT_AMBULATORY_CARE_PROVIDER_SITE_OTHER): Payer: Medicare Other | Admitting: Family Medicine

## 2014-07-01 DIAGNOSIS — Z23 Encounter for immunization: Secondary | ICD-10-CM

## 2014-07-09 ENCOUNTER — Other Ambulatory Visit: Payer: Self-pay | Admitting: Physician Assistant

## 2014-07-09 NOTE — Telephone Encounter (Signed)
Medication refilled per protocol. 

## 2014-07-30 ENCOUNTER — Telehealth: Payer: Self-pay | Admitting: Family Medicine

## 2014-07-30 NOTE — Telephone Encounter (Signed)
?   OK to Refill  

## 2014-07-30 NOTE — Telephone Encounter (Signed)
Patient is calling for refill on hydrocodone  [phone]

## 2014-07-31 MED ORDER — HYDROCODONE-ACETAMINOPHEN 5-325 MG PO TABS: 1.0000 | ORAL_TABLET | Freq: Two times a day (BID) | ORAL | Status: DC | PRN

## 2014-07-31 NOTE — Telephone Encounter (Signed)
ok 

## 2014-07-31 NOTE — Telephone Encounter (Signed)
RX printed, left up front and patient aware to pick up  

## 2014-08-05 ENCOUNTER — Other Ambulatory Visit: Payer: Self-pay | Admitting: Hematology & Oncology

## 2014-08-26 ENCOUNTER — Encounter: Payer: Self-pay | Admitting: Family Medicine

## 2014-09-08 ENCOUNTER — Other Ambulatory Visit: Payer: Self-pay | Admitting: Hematology & Oncology

## 2014-09-12 ENCOUNTER — Telehealth: Payer: Self-pay | Admitting: Family Medicine

## 2014-09-12 MED ORDER — HYDROCODONE-ACETAMINOPHEN 5-325 MG PO TABS: 1.0000 | ORAL_TABLET | Freq: Two times a day (BID) | ORAL | Status: DC | PRN

## 2014-09-12 NOTE — Telephone Encounter (Signed)
[phone] PT is needing a refill on HYDROcodone-acetaminophen (NORCO/VICODIN) 5-325 MG per tablet

## 2014-09-12 NOTE — Telephone Encounter (Signed)
?   OK to Refill  

## 2014-09-12 NOTE — Telephone Encounter (Signed)
RX printed, left up front and patient aware to pick up via vm 

## 2014-09-12 NOTE — Telephone Encounter (Signed)
ok 

## 2014-09-17 ENCOUNTER — Ambulatory Visit (INDEPENDENT_AMBULATORY_CARE_PROVIDER_SITE_OTHER): Payer: PPO | Admitting: Physician Assistant

## 2014-09-17 ENCOUNTER — Encounter: Payer: Self-pay | Admitting: Physician Assistant

## 2014-09-17 VITALS — BP 164/96 | HR 84 | Temp 99.4°F | Resp 22 | Wt 234.0 lb

## 2014-09-17 DIAGNOSIS — J209 Acute bronchitis, unspecified: Secondary | ICD-10-CM

## 2014-09-17 MED ORDER — IPRATROPIUM-ALBUTEROL 0.5-2.5 (3) MG/3ML IN SOLN
3.0000 mL | Freq: Once | RESPIRATORY_TRACT | Status: AC
  Administered 2014-09-17: 3 mL via RESPIRATORY_TRACT

## 2014-09-17 MED ORDER — PREDNISONE 20 MG PO TABS: ORAL_TABLET | ORAL | Status: DC

## 2014-09-17 MED ORDER — METHYLPREDNISOLONE ACETATE 80 MG/ML IJ SUSP
80.0000 mg | Freq: Once | INTRAMUSCULAR | Status: AC
  Administered 2014-09-17: 80 mg via INTRAMUSCULAR

## 2014-09-17 MED ORDER — LEVOFLOXACIN 750 MG PO TABS: 750.0000 mg | ORAL_TABLET | Freq: Every day | ORAL | Status: DC

## 2014-09-17 NOTE — Progress Notes (Signed)
Patient ID: [name] MRN: [id number], DOB: [birth year], 74 y.o. Date of Encounter: @DATE @  Chief Complaint:  Chief Complaint  Patient presents with  . sick for sev days    cold/cough, progressively worse since last night    HPI: 74 y.o. year old white female  presents with the above symptoms.  Says that she started getting sick on Sunday 09/14/14. Says that day she continued to have repetitive cough. Times felt like she couldn't breathe so used her inhaler did get relief with that. Says had the same kinds of symptoms Monday. Again, would use her inhaler with relief. Says that last night however things got worse to the point that she would use her inhaler and not feel relief. Would still feel like she could not get good breath. Has been coughing up thick dark phlegm. Her head feels stopped up. Is not blowing anything out of her nose. Feels like it is draining down her throat and then coughs up that phlegm. Also her right cheek/maxillary sinus feels like pressure/pain.  Reviewed her chart that shows that her last visit here with similar symptoms was 05/19/14 with Dr. Buelah Manis. She says that that is the last time she had similar symptoms. Says that she has not gone to an urgent care other medical facility with similar symptoms since that visit in October.    Past Medical History  Diagnosis Date  . Anemia   . Depression   . Arthritis   . Osteoporosis   . Vitamin D deficiency   . Diverticulosis   . Breast cancer      Home Meds: Outpatient Prescriptions Prior to Visit  Medication Sig Dispense Refill  . albuterol (PROAIR HFA) 108 (90 BASE) MCG/ACT inhaler Inhale 2 puffs into the lungs every 6 (six) hours as needed. 1 Inhaler 3  . alendronate (FOSAMAX) 70 MG tablet TAKE ONE TABLET BY MOUTH ONCE A WEEK IN THE MORNING WITH A GLASS OF WATER . 30 MINUTES BEFORE A MEAL OR BEVERAGE REMAIN UPRIGHT 12 tablet 0  . Cholecalciferol (VITAMIN D) 2000 UNITS tablet Take 2,000 Units by  mouth daily. 09-25-12 pt was instructed to increase from 1000 to 2000 units daily    . escitalopram (LEXAPRO) 10 MG tablet TAKE ONE TABLET BY MOUTH  DAILY 30 tablet 0  . HYDROcodone-acetaminophen (NORCO/VICODIN) 5-325 MG per tablet Take 1 tablet by mouth 2 (two) times daily as needed. 60 tablet 0  . omeprazole (PRILOSEC) 20 MG capsule TAKE ONE CAPSULE BY MOUTH TWICE DAILY 60 capsule 11  . vitamin B-12 (CYANOCOBALAMIN) 100 MCG tablet Take 50 mcg by mouth daily.     . vitamin E 100 UNIT capsule Take 100 Units by mouth daily.      Marland Kitchen levofloxacin (LEVAQUIN) 500 MG tablet Take 1 tablet (500 mg total) by mouth daily. 7 tablet 0  . predniSONE (DELTASONE) 10 MG tablet take 30mg  x 2 days, 20mg  x 2 days, 10mg  x 2 days then stop 12 tablet 0   No facility-administered medications prior to visit.    Allergies:  Allergies  Allergen Reactions  . Penicillins Hives    History   Social History  . Marital Status: Divorced    Spouse Name: N/A  . Number of Children: N/A  . Years of Education: N/A   Occupational History  . Not on file.   Social History Main Topics  . Smoking status: Never Smoker   . Smokeless tobacco: Never Used     Comment: never used tobacco  .  Alcohol Use: No  . Drug Use: No  . Sexual Activity: Not on file   Other Topics Concern  . Not on file   Social History Narrative    History reviewed. No pertinent family history.   Review of Systems:  See HPI for pertinent ROS. All other ROS negative.    Physical Exam: Blood pressure 164/96, pulse 84, temperature 99.4 F (37.4 C), temperature source Oral, resp. rate 22, weight 234 lb (106.142 kg), SpO2 91 %., Body mass index is 40.15 kg/(m^2). General: Obese white female.  Can hear her breathing heavily when speaking to the patient. However she does not appears in any acute distress. Head: Normocephalic, atraumatic, eyes without discharge, sclera non-icteric, nares are without discharge. Bilateral auditory canals clear, TM's are  without perforation, pearly grey and translucent with reflective cone of light bilaterally. Oral cavity moist, posterior pharynx without exudate, erythema, peritonsillar abscess. Minimal tenderness with percussion of right maxillary sinus. No tenderness with percussion of other frontal sinuses and left maxillary sinuses.  Neck: Supple. No thyromegaly. No lymphadenopathy. Lungs:  I can hear her breathing heavily/wheezing when speaking to the patient.  However she does not appear in any acute distress. Oxygen saturation on room air 91% upon arrival. Decreased breath sounds and air movement throughout. Tight wheezes heard throughout. Repeat examination after DuoNeb treatment: Much improved/increased air movement. High pitched wheezes at the end of expiration lateral upper lungs. Very little wheeze at the lower lungs. Heart: RRR with S1 S2. No murmurs, rubs, or gallops. Musculoskeletal:  Strength and tone normal for age. Extremities/Skin: Warm and dry.  Neuro: Alert and oriented X 3. Moves all extremities spontaneously. Gait is normal. CNII-XII grossly in tact. Psych:  Responds to questions appropriately with a normal affect.     ASSESSMENT AND PLAN:  74 y.o. year old female with  1. Acute bronchitis, unspecified organism She was treated in the office with Depo-Medrol 80 mg IM. She was also treated with a DuoNeb nebulizer. She is to start Levaquin today. Take as directed and complete all of it. She is to start the oral prednisone taper tomorrow. She is to use her albuterol inhaler every 4 hours on a routine basis for 5 days then to decrease back down to PRN dosing.  Follow-up with [place] or go to ER immediately if breathing worsens. Follow-up if symptoms do not resolve in one week. - levofloxacin (LEVAQUIN) 750 MG tablet; Take 1 tablet (750 mg total) by mouth daily.  Dispense: 7 tablet; Refill: 0 - predniSONE (DELTASONE) 20 MG tablet; Take 3 daily for 2 days, then 2 daily for 2 days, then 1 daily  for 2 days.  Dispense: 12 tablet; Refill: 0   Signed, [address] [place], [place], [facility] 09/17/2014 10:08 AM

## 2014-09-22 ENCOUNTER — Telehealth: Payer: Self-pay | Admitting: Family Medicine

## 2014-09-22 DIAGNOSIS — J209 Acute bronchitis, unspecified: Secondary | ICD-10-CM

## 2014-09-22 NOTE — Telephone Encounter (Signed)
[phone] Pt came in last week with trouble breathing and she is wanting to speak to you in regards to possibly getting a nebulizer or something that can help her with preventing that from happening again

## 2014-09-22 NOTE — Telephone Encounter (Signed)
She has an albuterol inhaler on her medicine list--- if she has another flare she can use this if needed.  Also I have reviewed her chart and she does not have frequent exacerbations. The only episodes of bronchitis I see dating back to 2014 were 07/2013 and 05/2014. Therefore, does not need to add a new daily medication as preventive.  In the future, if she starts having any symptoms she can start using her albuterol inhaler to hold her over until she can come in. She will need to come in and be seen and be treated at that time.

## 2014-09-25 ENCOUNTER — Telehealth: Payer: Self-pay | Admitting: Family Medicine

## 2014-09-25 NOTE — Telephone Encounter (Signed)
See phone note from 2/22.  Have been trying to call.  Pt not calling back

## 2014-09-25 NOTE — Telephone Encounter (Signed)
Patient is calling to talk with you about her last visit and her bronchitis  [phone]

## 2014-09-26 NOTE — Telephone Encounter (Signed)
Pt called back.  Explained to her she does not qualify for home nebulizer.  She understood.  Says has finished antibiotic and does feel better.  Continues to have lingering cough.  Is coughing out thick yellowish-green secretions.  She feels they are coming from her head.  Wanted to let you know in case there was something else you wanted to do??

## 2014-09-29 MED ORDER — LEVOFLOXACIN 750 MG PO TABS: 750.0000 mg | ORAL_TABLET | Freq: Every day | ORAL | Status: DC

## 2014-09-29 MED ORDER — ALBUTEROL SULFATE HFA 108 (90 BASE) MCG/ACT IN AERS: 2.0000 | INHALATION_SPRAY | Freq: Four times a day (QID) | RESPIRATORY_TRACT | Status: DC | PRN

## 2014-09-29 NOTE — Telephone Encounter (Signed)
Follow up with pt and see if she is stlll getting out any thick mucus or phlegm.  If it has resolved--no further treatment. If still with thick mucus or phlegm--treat with another round of Levaquin--750mg  1 po QD x 7 days. # 7 + 0.

## 2014-09-29 NOTE — Telephone Encounter (Signed)
Pt still with cough and thick secretions.  Levaquin refilled for another 7 days. Pt also ask for refill of MDI

## 2014-10-21 ENCOUNTER — Other Ambulatory Visit: Payer: Self-pay | Admitting: Hematology & Oncology

## 2014-10-21 ENCOUNTER — Telehealth: Payer: Self-pay | Admitting: Family Medicine

## 2014-10-21 NOTE — Telephone Encounter (Signed)
[phone]  PT is needing a refill on HYDROcodone-acetaminophen (NORCO/VICODIN) 5-325 MG per tablet

## 2014-10-21 NOTE — Telephone Encounter (Signed)
?   Ok to refill  Last refill 09/12/14  Last ov 020/17/16

## 2014-10-21 NOTE — Telephone Encounter (Signed)
ok 

## 2014-10-22 MED ORDER — HYDROCODONE-ACETAMINOPHEN 5-325 MG PO TABS: 1.0000 | ORAL_TABLET | Freq: Two times a day (BID) | ORAL | Status: DC | PRN

## 2014-10-22 NOTE — Telephone Encounter (Signed)
Script printed,ready for provider signature 

## 2014-11-28 ENCOUNTER — Telehealth: Payer: Self-pay | Admitting: Family Medicine

## 2014-11-28 MED ORDER — HYDROCODONE-ACETAMINOPHEN 5-325 MG PO TABS: 1.0000 | ORAL_TABLET | Freq: Two times a day (BID) | ORAL | Status: DC | PRN

## 2014-11-28 NOTE — Telephone Encounter (Signed)
?   OK to Refill  

## 2014-11-28 NOTE — Telephone Encounter (Signed)
RX printed, left up front and patient aware to pick up via vm 

## 2014-11-28 NOTE — Telephone Encounter (Signed)
Patient is calling to get refill on hydrocodone  [phone] When ready

## 2014-11-28 NOTE — Telephone Encounter (Signed)
ok 

## 2014-11-29 ENCOUNTER — Other Ambulatory Visit: Payer: Self-pay | Admitting: Hematology & Oncology

## 2014-12-17 ENCOUNTER — Other Ambulatory Visit: Payer: Self-pay | Admitting: Family Medicine

## 2014-12-19 ENCOUNTER — Encounter: Payer: Self-pay | Admitting: Hematology & Oncology

## 2014-12-19 ENCOUNTER — Other Ambulatory Visit (HOSPITAL_BASED_OUTPATIENT_CLINIC_OR_DEPARTMENT_OTHER): Payer: PPO

## 2014-12-19 ENCOUNTER — Ambulatory Visit (HOSPITAL_BASED_OUTPATIENT_CLINIC_OR_DEPARTMENT_OTHER): Payer: PPO | Admitting: Hematology & Oncology

## 2014-12-19 VITALS — BP 143/64 | HR 54 | Temp 98.0°F | Resp 16 | Ht 64.0 in | Wt 237.0 lb

## 2014-12-19 DIAGNOSIS — C50912 Malignant neoplasm of unspecified site of left female breast: Secondary | ICD-10-CM

## 2014-12-19 DIAGNOSIS — C50919 Malignant neoplasm of unspecified site of unspecified female breast: Secondary | ICD-10-CM

## 2014-12-19 LAB — COMPREHENSIVE METABOLIC PANEL
ALBUMIN: 3.5 g/dL (ref 3.5–5.2)
ALK PHOS: 44 U/L (ref 39–117)
ALT: 13 U/L (ref 0–35)
AST: 21 U/L (ref 0–37)
BILIRUBIN TOTAL: 0.6 mg/dL (ref 0.2–1.2)
BUN: 11 mg/dL (ref 6–23)
CO2: 27 mEq/L (ref 19–32)
Calcium: 8.9 mg/dL (ref 8.4–10.5)
Chloride: 104 mEq/L (ref 96–112)
Creatinine, Ser: 0.65 mg/dL (ref 0.50–1.10)
GLUCOSE: 83 mg/dL (ref 70–99)
Potassium: 4 mEq/L (ref 3.5–5.3)
SODIUM: 142 meq/L (ref 135–145)
Total Protein: 6.1 g/dL (ref 6.0–8.3)

## 2014-12-19 LAB — CBC WITH DIFFERENTIAL (CANCER CENTER ONLY)
BASO#: 0 10*3/uL (ref 0.0–0.2)
BASO%: 0.6 % (ref 0.0–2.0)
EOS%: 5.1 % (ref 0.0–7.0)
Eosinophils Absolute: 0.3 10*3/uL (ref 0.0–0.5)
HEMATOCRIT: 42.2 % (ref 34.8–46.6)
HGB: 14.3 g/dL (ref 11.6–15.9)
LYMPH#: 1.5 10*3/uL (ref 0.9–3.3)
LYMPH%: 28.9 % (ref 14.0–48.0)
MCH: 33.4 pg (ref 26.0–34.0)
MCHC: 33.9 g/dL (ref 32.0–36.0)
MCV: 99 fL (ref 81–101)
MONO#: 0.5 10*3/uL (ref 0.1–0.9)
MONO%: 10 % (ref 0.0–13.0)
NEUT#: 2.9 10*3/uL (ref 1.5–6.5)
NEUT%: 55.4 % (ref 39.6–80.0)
Platelets: 176 10*3/uL (ref 145–400)
RBC: 4.28 10*6/uL (ref 3.70–5.32)
RDW: 12.8 % (ref 11.1–15.7)
WBC: 5.3 10*3/uL (ref 3.9–10.0)

## 2014-12-19 NOTE — Progress Notes (Signed)
Hematology and Oncology Follow Up Visit  [name] [id number] [birth year] 74 y.o. 12/19/2014   Principle Diagnosis:  Stage IIB (T2 N1 M0) ductal carcinoma of the right breast.  Current Therapy:    Observation     Interim History:  Ms.  Figueroa is comes in for followup. We last saw her a year ago. She's doing well. 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 She had a good Mother's Day. She is down at the beach. [name] she's had no problems outside of a sinus issue. She has a little bit of a sinus congestion right now.  She's had no problems with nausea vomiting. Patient had no rashes. She's had no leg swelling. She has occasional swelling of the left arm.  There's been no change in bowel or bladder habits.  She goes for another colonoscopy. I would think that this would be her last colonoscopy if everything is clear.  She's had no fevers. She had no infection over wintertime.  Overall, her performance status is ECOG 1.  Medications:  Current outpatient prescriptions:  .  albuterol (PROAIR HFA) 108 (90 BASE) MCG/ACT inhaler, Inhale 2 puffs into the lungs every 6 (six) hours as needed., Disp: 1 Inhaler, Rfl: 3 .  alendronate (FOSAMAX) 70 MG tablet, TAKE ONE TABLET BY MOUTH ONCE A WEEK IN THE MORNING WITH A GLASS OF WATER. 30 MINUTES BEFORE A MEAL OR BEVERAGE REMAIN UPRIGHT., Disp: 12 tablet, Rfl: 4 .  Cholecalciferol (VITAMIN D) 2000 UNITS tablet, Take 2,000 Units by mouth daily. 09-25-12 pt was instructed to increase from 1000 to 2000 units daily, Disp: , Rfl:  .  escitalopram (LEXAPRO) 10 MG tablet, TAKE ONE TABLET BY MOUTH  DAILY, Disp: 30 tablet, Rfl: 0 .  HYDROcodone-acetaminophen (NORCO/VICODIN) 5-325 MG per tablet, Take 1 tablet by mouth 2 (two) times daily as needed., Disp: 60 tablet, Rfl: 0 .  omeprazole (PRILOSEC) 20 MG capsule, TAKE ONE CAPSULE BY MOUTH TWICE DAILY, Disp: 60 capsule, Rfl: 11 .  predniSONE (DELTASONE) 20 MG tablet, Take 3 daily  for 2 days, then 2 daily for 2 days, then 1 daily for 2 days., Disp: 12 tablet, Rfl: 0 .  vitamin B-12 (CYANOCOBALAMIN) 100 MCG tablet, Take 50 mcg by mouth daily. , Disp: , Rfl:  .  vitamin E 100 UNIT capsule, Take 100 Units by mouth daily.  , Disp: , Rfl:   Allergies:  Allergies  Allergen Reactions  . Penicillins Hives    Past Medical History, Surgical history, Social history, and Family History were reviewed and updated.  Review of Systems: As above  Physical Exam:  height is 5\' 4"  (1.626 m) and weight is 237 lb (107.502 kg). Her oral temperature is 98 F (36.7 C). Her blood pressure is 143/64 and her pulse is 54. Her respiration is 16.   Somewhat obese white female. Her head and neck exam shows no ocular or oral lesions. There are no palpable cervical or supraclavicular lymph nodes. Her lungs are clear. Cardiac exam regular rate and rhythm with no murmurs, rubs or bruits.. Chest wall exam shows bilateral mastectomies. No bilateral axillary adenopathy is noted. Abdomen soft. Mildly obese. There is no palpable liver or spleen tip. Back exam no tenderness over the spine ribs or hips. Extremities shows no clubbing cyanosis or edema. She has some chronic mild non-pitting edema in the lower legs. Skin exam no rashes.  Lab Results  Component Value Date   WBC 5.3 12/19/2014  HGB 14.3 12/19/2014   HCT 42.2 12/19/2014   MCV 99 12/19/2014   PLT 176 12/19/2014     Chemistry      Component Value Date/Time   NA 143 12/20/2013 0808   K 4.4 12/20/2013 0808   CL 105 12/20/2013 0808   CO2 29 12/20/2013 0808   BUN 9 12/20/2013 0808   CREATININE 0.64 12/20/2013 0808      Component Value Date/Time   CALCIUM 9.2 12/20/2013 0808   ALKPHOS 51 12/20/2013 0808   AST 21 12/20/2013 0808   ALT 14 12/20/2013 0808   BILITOT 0.6 12/20/2013 0808         Impression and Plan: [name] is 74 year old white female with a history of stage to be dictating the carcinoma the right breast. She did  receive some adjuvant chemotherapy. She had chemotherapy that was completed back in June of 2004.  We will go ahead and him back in another year.     Volanda Napoleon, MD 5/20/20168:35 AM

## 2015-01-05 ENCOUNTER — Telehealth: Payer: Self-pay | Admitting: Family Medicine

## 2015-01-05 NOTE — Telephone Encounter (Signed)
Patient calling to get refill on her hydrocodone  [phone] when ready

## 2015-01-05 NOTE — Telephone Encounter (Signed)
?   OK to Refill  

## 2015-01-06 MED ORDER — HYDROCODONE-ACETAMINOPHEN 5-325 MG PO TABS: 1.0000 | ORAL_TABLET | Freq: Two times a day (BID) | ORAL | Status: DC | PRN

## 2015-01-06 NOTE — Telephone Encounter (Signed)
RX printed, left up front and patient aware to pick up  

## 2015-01-06 NOTE — Telephone Encounter (Signed)
ok 

## 2015-01-15 ENCOUNTER — Other Ambulatory Visit: Payer: Self-pay | Admitting: Hematology & Oncology

## 2015-01-21 ENCOUNTER — Telehealth: Payer: Self-pay | Admitting: Family Medicine

## 2015-01-21 NOTE — Telephone Encounter (Signed)
Left pt message NTBS, call back for appt.

## 2015-01-21 NOTE — Telephone Encounter (Signed)
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

## 2015-01-21 NOTE — Telephone Encounter (Signed)
Sorry, but NTBS.

## 2015-02-10 ENCOUNTER — Telehealth: Payer: Self-pay | Admitting: Family Medicine

## 2015-02-10 MED ORDER — HYDROCODONE-ACETAMINOPHEN 5-325 MG PO TABS: 1.0000 | ORAL_TABLET | Freq: Two times a day (BID) | ORAL | Status: DC | PRN

## 2015-02-10 NOTE — Telephone Encounter (Signed)
ok 

## 2015-02-10 NOTE — Telephone Encounter (Signed)
Patient calling to get rx printed for her hydrocodone  [phone] ([phone])

## 2015-02-10 NOTE — Telephone Encounter (Signed)
?   OK to Refill  

## 2015-02-10 NOTE — Telephone Encounter (Signed)
RX printed, left up front and patient aware to pick up via vm 

## 2015-03-03 ENCOUNTER — Other Ambulatory Visit: Payer: Self-pay | Admitting: Hematology & Oncology

## 2015-03-11 ENCOUNTER — Telehealth: Payer: Self-pay | Admitting: Family Medicine

## 2015-03-11 NOTE — Telephone Encounter (Signed)
Patient calling to get rx for her hydrocodone  [phone]

## 2015-03-11 NOTE — Telephone Encounter (Signed)
?   OK to Refill  

## 2015-03-11 NOTE — Telephone Encounter (Signed)
ok 

## 2015-03-12 MED ORDER — HYDROCODONE-ACETAMINOPHEN 5-325 MG PO TABS: 1.0000 | ORAL_TABLET | Freq: Two times a day (BID) | ORAL | Status: DC | PRN

## 2015-03-12 NOTE — Telephone Encounter (Signed)
RX printed, left up front and patient aware to pick up after 2  

## 2015-03-31 ENCOUNTER — Other Ambulatory Visit: Payer: Self-pay | Admitting: Family Medicine

## 2015-04-22 ENCOUNTER — Telehealth: Payer: Self-pay | Admitting: Family Medicine

## 2015-04-22 NOTE — Telephone Encounter (Signed)
?   OK to Refill  

## 2015-04-22 NOTE — Telephone Encounter (Signed)
Patient is calling to get rx on hydrocodone  [phone]

## 2015-04-23 MED ORDER — HYDROCODONE-ACETAMINOPHEN 5-325 MG PO TABS: 1.0000 | ORAL_TABLET | Freq: Two times a day (BID) | ORAL | Status: DC | PRN

## 2015-04-23 NOTE — Telephone Encounter (Signed)
RX printed, left up front and patient aware to pick up  

## 2015-04-23 NOTE — Telephone Encounter (Signed)
ok 

## 2015-04-29 ENCOUNTER — Other Ambulatory Visit: Payer: Self-pay | Admitting: Hematology & Oncology

## 2015-06-18 ENCOUNTER — Telehealth: Payer: Self-pay | Admitting: Family Medicine

## 2015-06-18 MED ORDER — HYDROCODONE-ACETAMINOPHEN 5-325 MG PO TABS: 1.0000 | ORAL_TABLET | Freq: Two times a day (BID) | ORAL | Status: DC | PRN

## 2015-06-18 NOTE — Telephone Encounter (Signed)
Pt aware RX ready

## 2015-06-18 NOTE — Telephone Encounter (Signed)
Approved.  

## 2015-06-18 NOTE — Telephone Encounter (Signed)
Pt is calling to request a refill of Hydrocodone.   Pt ph: [phone]

## 2015-06-18 NOTE — Telephone Encounter (Signed)
Ok to refill??  Last office visit 12/19/2014.  Last refill 04/23/2015.

## 2015-06-19 ENCOUNTER — Ambulatory Visit (INDEPENDENT_AMBULATORY_CARE_PROVIDER_SITE_OTHER): Payer: PPO | Admitting: *Deleted

## 2015-06-19 DIAGNOSIS — Z299 Encounter for prophylactic measures, unspecified: Secondary | ICD-10-CM

## 2015-06-19 DIAGNOSIS — Z418 Encounter for other procedures for purposes other than remedying health state: Secondary | ICD-10-CM | POA: Diagnosis not present

## 2015-06-19 NOTE — Progress Notes (Signed)
Patient ID: [name], female   DOB: [birth year], 74 y.o.   MRN: [id number]  Patient seen in office for Influenza Vaccination.   Tolerated IM administration well.   Immunization history updated.

## 2015-06-22 ENCOUNTER — Ambulatory Visit: Payer: PPO

## 2015-06-26 ENCOUNTER — Other Ambulatory Visit: Payer: Self-pay | Admitting: Hematology & Oncology

## 2015-06-26 ENCOUNTER — Other Ambulatory Visit: Payer: Self-pay | Admitting: Family Medicine

## 2015-08-06 ENCOUNTER — Telehealth: Payer: Self-pay | Admitting: Family Medicine

## 2015-08-06 NOTE — Telephone Encounter (Signed)
Patient calling to get rx for her hydrocodone  [phone]

## 2015-08-06 NOTE — Telephone Encounter (Signed)
?   OK to Refill  

## 2015-08-07 MED ORDER — HYDROCODONE-ACETAMINOPHEN 5-325 MG PO TABS: 1.0000 | ORAL_TABLET | Freq: Two times a day (BID) | ORAL | Status: DC | PRN

## 2015-08-07 NOTE — Telephone Encounter (Signed)
ok 

## 2015-08-07 NOTE — Telephone Encounter (Signed)
RX printed, left up front - tried to call pt and mailbox is full 

## 2015-08-13 DIAGNOSIS — H04123 Dry eye syndrome of bilateral lacrimal glands: Secondary | ICD-10-CM | POA: Diagnosis not present

## 2015-08-13 DIAGNOSIS — H40013 Open angle with borderline findings, low risk, bilateral: Secondary | ICD-10-CM | POA: Diagnosis not present

## 2015-08-13 DIAGNOSIS — H04223 Epiphora due to insufficient drainage, bilateral lacrimal glands: Secondary | ICD-10-CM | POA: Diagnosis not present

## 2015-08-14 ENCOUNTER — Other Ambulatory Visit: Payer: Self-pay | Admitting: Hematology & Oncology

## 2015-09-08 ENCOUNTER — Encounter: Payer: Self-pay | Admitting: Family Medicine

## 2015-09-08 ENCOUNTER — Ambulatory Visit (INDEPENDENT_AMBULATORY_CARE_PROVIDER_SITE_OTHER): Payer: PPO | Admitting: Family Medicine

## 2015-09-08 VITALS — BP 140/78 | HR 68 | Temp 99.0°F | Resp 12 | Ht 64.0 in | Wt 228.0 lb

## 2015-09-08 DIAGNOSIS — E559 Vitamin D deficiency, unspecified: Secondary | ICD-10-CM

## 2015-09-08 DIAGNOSIS — J0100 Acute maxillary sinusitis, unspecified: Secondary | ICD-10-CM | POA: Diagnosis not present

## 2015-09-08 DIAGNOSIS — M2042 Other hammer toe(s) (acquired), left foot: Secondary | ICD-10-CM | POA: Diagnosis not present

## 2015-09-08 DIAGNOSIS — M150 Primary generalized (osteo)arthritis: Secondary | ICD-10-CM | POA: Diagnosis not present

## 2015-09-08 DIAGNOSIS — J209 Acute bronchitis, unspecified: Secondary | ICD-10-CM | POA: Diagnosis not present

## 2015-09-08 DIAGNOSIS — E669 Obesity, unspecified: Secondary | ICD-10-CM | POA: Diagnosis not present

## 2015-09-08 DIAGNOSIS — K649 Unspecified hemorrhoids: Secondary | ICD-10-CM | POA: Diagnosis not present

## 2015-09-08 DIAGNOSIS — M159 Polyosteoarthritis, unspecified: Secondary | ICD-10-CM

## 2015-09-08 DIAGNOSIS — M8949 Other hypertrophic osteoarthropathy, multiple sites: Secondary | ICD-10-CM

## 2015-09-08 LAB — CBC WITH DIFFERENTIAL/PLATELET
BASOS PCT: 1 % (ref 0–1)
Basophils Absolute: 0.1 10*3/uL (ref 0.0–0.1)
EOS ABS: 0.3 10*3/uL (ref 0.0–0.7)
EOS PCT: 5 % (ref 0–5)
HCT: 42.4 % (ref 36.0–46.0)
HEMOGLOBIN: 14.3 g/dL (ref 12.0–15.0)
Lymphocytes Relative: 27 % (ref 12–46)
Lymphs Abs: 1.4 10*3/uL (ref 0.7–4.0)
MCH: 32 pg (ref 26.0–34.0)
MCHC: 33.7 g/dL (ref 30.0–36.0)
MCV: 94.9 fL (ref 78.0–100.0)
MONO ABS: 0.4 10*3/uL (ref 0.1–1.0)
MPV: 9 fL (ref 8.6–12.4)
Monocytes Relative: 7 % (ref 3–12)
Neutro Abs: 3.1 10*3/uL (ref 1.7–7.7)
Neutrophils Relative %: 60 % (ref 43–77)
PLATELETS: 193 10*3/uL (ref 150–400)
RBC: 4.47 MIL/uL (ref 3.87–5.11)
RDW: 13.3 % (ref 11.5–15.5)
WBC: 5.1 10*3/uL (ref 4.0–10.5)

## 2015-09-08 LAB — COMPREHENSIVE METABOLIC PANEL
ALBUMIN: 3.4 g/dL — AB (ref 3.6–5.1)
ALT: 10 U/L (ref 6–29)
AST: 17 U/L (ref 10–35)
Alkaline Phosphatase: 46 U/L (ref 33–130)
BUN: 11 mg/dL (ref 7–25)
CHLORIDE: 105 mmol/L (ref 98–110)
CO2: 28 mmol/L (ref 20–31)
Calcium: 8.6 mg/dL (ref 8.6–10.4)
Creat: 0.65 mg/dL (ref 0.60–0.93)
GLUCOSE: 84 mg/dL (ref 70–99)
Potassium: 4.2 mmol/L (ref 3.5–5.3)
SODIUM: 142 mmol/L (ref 135–146)
Total Bilirubin: 0.7 mg/dL (ref 0.2–1.2)
Total Protein: 5.6 g/dL — ABNORMAL LOW (ref 6.1–8.1)

## 2015-09-08 LAB — LIPID PANEL
Cholesterol: 176 mg/dL (ref 125–200)
HDL: 56 mg/dL (ref >=46)
LDL CALC: 108 mg/dL (ref <130)
TRIGLYCERIDES: 61 mg/dL (ref <150)
Total CHOL/HDL Ratio: 3.1 Ratio (ref <=5.0)
VLDL: 12 mg/dL (ref <30)

## 2015-09-08 MED ORDER — HYDROCODONE-ACETAMINOPHEN 5-325 MG PO TABS: 1.0000 | ORAL_TABLET | Freq: Two times a day (BID) | ORAL | Status: DC | PRN

## 2015-09-08 MED ORDER — ALBUTEROL SULFATE HFA 108 (90 BASE) MCG/ACT IN AERS: 2.0000 | INHALATION_SPRAY | Freq: Four times a day (QID) | RESPIRATORY_TRACT | Status: DC | PRN

## 2015-09-08 MED ORDER — HYDROCORTISONE ACE-PRAMOXINE 1-1 % RE CREA: TOPICAL_CREAM | RECTAL | Status: DC

## 2015-09-08 NOTE — Assessment & Plan Note (Signed)
analpram - insert daily for 5 days, can use topicaly on area on vagina, no specific region just excoriations on labia

## 2015-09-08 NOTE — Patient Instructions (Signed)
Use cream for next 5 days Take antibiotics Use nasal saline, continue zyrtec We will call with lab results F/U 4 months for PHYSICAL

## 2015-09-08 NOTE — Assessment & Plan Note (Signed)
Hydrocodone refilled.  

## 2015-09-08 NOTE — Progress Notes (Signed)
Patient ID: [name], female   DOB: 08-Mar-1941, 75 y.o.   MRN: [id number]    Subjective:    Patient ID: [name], female    DOB: 1941/06/14, 75 y.o.   MRN: [id number]  Patient presents for Labs; Seasonal Allergies; and Hemorrhoids  patient with multiple concerns. For the past 3 weeks she's had sinus drainage which is worsening sinus pressure sinus headache. She now has thick green drainage. She had been taking Zyrtec for a couple weeks with minimal improvement. She also had low-grade fever and some chills a few nights ago. She has mild cough mostly from the drainage.  She had a stomach bug of some sort also a few weeks ago that has resolved but she now has the itching in the rectal area and states there are hemorrhoids were acting up. She is not constipated currently. She was requesting something more than over-the-counter to help with her hemorrhoidal itching. She also has an itching spot on her labia which she gets on and off. There is no bumps or other lesions there.  She is due for fasting labs. She does follow with her oncologist.  She has a hammertoe on her left foot that is impressive for the past couple months. She wanted [place] to evaluated when I discussed podiatry for intervention as she also has significant bunion on that foot she declined  Requests refill on her pain medication which she uses for her osteoarthritis   Review Of Systems:  GEN- denies fatigue, fever, weight loss,weakness, recent illness HEENT- denies eye drainage, change in vision,+ nasal discharge, CVS- denies chest pain, palpitations RESP- denies SOB,+ cough, wheeze ABD- denies N/V, change in stools, abd pain GU- denies dysuria, hematuria, dribbling, incontinence MSK- + joint pain, muscle aches, injury Neuro- denies headache, dizziness, syncope, seizure activity       Objective:    BP 140/78 mmHg  Pulse 68  Temp(Src) 99 F (37.2 C) (Oral)  Resp 12  Ht 5\' 4"  (1.626 m)  Wt 228 lb (103.42  kg)  BMI 39.12 kg/m2 GEN- NAD, alert and oriented x3 HEENT- PERRL, EOMI, non injected sclera, pink conjunctiva, MMM, oropharynx clear, nares +rhinorrhea,enlarged turbinates, TTP maxillary sinus, TM clear no effusion, canals clear  Neck- Supple, no LAD CVS- RRR, no murmur RESP-CTAB ABD-NABS,soft,NT,ND Skin- mild erythema perirectal region, no prolapsed malaise seen. Excoriations on the right labia but no specific lesions. EXT- No edema Pulses- Radial - 2+        Assessment & Plan:      Problem List Items Addressed This Visit    Vitamin D deficiency   Obesity   Relevant Orders   CBC with Differential/Platelet   Lipid panel   OA (osteoarthritis)    Hydrocodone refilled      Relevant Medications   HYDROcodone-acetaminophen (NORCO/VICODIN) 5-325 MG tablet   Hemorrhoids    analpram - insert daily for 5 days, can use topicaly on area on vagina, no specific region just excoriations on labia       Other Visit Diagnoses    Hammer toe of left foot    -  Primary    Bunion also noted, declines podiatry advised supportive shoe, Mole skin to prevent rubbing     Acute maxillary sinusitis, recurrence not specified        Levaquin x 7 days, nasal saline, continue zyrtec , robitussin DM     Relevant Orders    CBC with Differential/Platelet    Comprehensive metabolic panel    Acute  bronchitis, unspecified organism        Relevant Medications    albuterol (PROAIR HFA) 108 (90 Base) MCG/ACT inhaler    Other Relevant Orders    Vitamin D, 25-hydroxy       Note: This dictation was prepared with Dragon dictation along with smaller phrase technology. Any transcriptional errors that result from this process are unintentional.

## 2015-09-09 ENCOUNTER — Encounter: Payer: Self-pay | Admitting: Family Medicine

## 2015-09-09 LAB — VITAMIN D 25 HYDROXY (VIT D DEFICIENCY, FRACTURES): VIT D 25 HYDROXY: 33 ng/mL (ref 30–100)

## 2015-09-11 ENCOUNTER — Telehealth: Payer: Self-pay | Admitting: Family Medicine

## 2015-09-11 ENCOUNTER — Other Ambulatory Visit: Payer: Self-pay | Admitting: Hematology & Oncology

## 2015-09-11 MED ORDER — LEVOFLOXACIN 500 MG PO TABS: 500.0000 mg | ORAL_TABLET | Freq: Every day | ORAL | Status: DC

## 2015-09-11 NOTE — Telephone Encounter (Signed)
rx to pharmacy and left pt message to inform

## 2015-09-11 NOTE — Telephone Encounter (Signed)
Pt calling because her pharmacy still does has not received RX for antibiotic from visit on Tuesday.

## 2015-09-11 NOTE — Telephone Encounter (Signed)
Levaquin 500mg daily x 7 days

## 2015-09-23 ENCOUNTER — Telehealth: Payer: Self-pay | Admitting: Family Medicine

## 2015-09-23 NOTE — Telephone Encounter (Signed)
Pt says still sick with sinus infection.  Still with a lot of pain and pressure, esp on left side.  Also has rash on bottom which has not resolved on bottom.  Please advise?

## 2015-09-23 NOTE — Telephone Encounter (Signed)
Left pt message that she does need to be seen.

## 2015-09-23 NOTE — Telephone Encounter (Signed)
Schedule for OV, to have these checked, I am not sure about rash

## 2015-10-13 ENCOUNTER — Telehealth: Payer: Self-pay | Admitting: Family Medicine

## 2015-10-13 DIAGNOSIS — J209 Acute bronchitis, unspecified: Secondary | ICD-10-CM

## 2015-10-13 MED ORDER — HYDROCODONE-ACETAMINOPHEN 5-325 MG PO TABS: 1.0000 | ORAL_TABLET | Freq: Two times a day (BID) | ORAL | Status: DC | PRN

## 2015-10-13 NOTE — Telephone Encounter (Signed)
Pt is requesting a refill of Hydrocodone and an Albuterol inhaler to be called in to the [facility]

## 2015-10-13 NOTE — Telephone Encounter (Signed)
?   OK to Refill  

## 2015-10-13 NOTE — Telephone Encounter (Signed)
ok 

## 2015-10-14 MED ORDER — ALBUTEROL SULFATE HFA 108 (90 BASE) MCG/ACT IN AERS: 2.0000 | INHALATION_SPRAY | Freq: Four times a day (QID) | RESPIRATORY_TRACT | Status: DC | PRN

## 2015-10-14 NOTE — Telephone Encounter (Addendum)
RX printed, left up front and patient aware to pick up via vm and proair sent to [facility]

## 2015-11-02 ENCOUNTER — Ambulatory Visit (INDEPENDENT_AMBULATORY_CARE_PROVIDER_SITE_OTHER): Payer: PPO | Admitting: Physician Assistant

## 2015-11-02 ENCOUNTER — Encounter: Payer: Self-pay | Admitting: Physician Assistant

## 2015-11-02 VITALS — BP 134/86 | HR 60 | Temp 97.9°F | Resp 18 | Wt 232.0 lb

## 2015-11-02 DIAGNOSIS — J209 Acute bronchitis, unspecified: Secondary | ICD-10-CM

## 2015-11-02 DIAGNOSIS — J988 Other specified respiratory disorders: Secondary | ICD-10-CM | POA: Diagnosis not present

## 2015-11-02 DIAGNOSIS — B9689 Other specified bacterial agents as the cause of diseases classified elsewhere: Secondary | ICD-10-CM

## 2015-11-02 DIAGNOSIS — J441 Chronic obstructive pulmonary disease with (acute) exacerbation: Secondary | ICD-10-CM | POA: Diagnosis not present

## 2015-11-02 MED ORDER — PREDNISONE 20 MG PO TABS: ORAL_TABLET | ORAL | Status: DC

## 2015-11-02 MED ORDER — DOXYCYCLINE HYCLATE 100 MG PO TABS: 100.0000 mg | ORAL_TABLET | Freq: Two times a day (BID) | ORAL | Status: DC

## 2015-11-02 MED ORDER — ALBUTEROL SULFATE HFA 108 (90 BASE) MCG/ACT IN AERS: 2.0000 | INHALATION_SPRAY | Freq: Four times a day (QID) | RESPIRATORY_TRACT | Status: DC | PRN

## 2015-11-02 NOTE — Progress Notes (Signed)
Patient ID: GAILENE PIKER MRN: [id number], DOB: 1940-12-30, 75 y.o. Date of Encounter: 11/02/2015, 12:53 PM    Chief Complaint:  Chief Complaint  Patient presents with  . cough,wheezing x 1 week    green secretions     HPI: 75 y.o. year old white female presents with above.   She reports that last week she was having cough that seemed really loose and productive of phlegm. Says that this week she has been having some wheezing especially at night. Says that she also feels some congestion in her head and sinuses but is not blowing any mucus out of her nose. Has been using her albuterol inhaler every 4 hours the past couple of days because in the past she has been instructed to use that to open the airways she has wheezing. Has had no known fevers or chills.     Home Meds:   Outpatient Prescriptions Prior to Visit  Medication Sig Dispense Refill  . alendronate (FOSAMAX) 70 MG tablet TAKE ONE TABLET BY MOUTH ONCE A WEEK IN THE MORNING WITH A GLASS OF WATER. 30 MINUTES BEFORE A MEAL OR BEVERAGE REMAIN UPRIGHT. 12 tablet 4  . Cholecalciferol (VITAMIN D) 2000 UNITS tablet Take 2,000 Units by mouth daily. 09-25-12 pt was instructed to increase from 1000 to 2000 units daily    . escitalopram (LEXAPRO) 10 MG tablet TAKE ONE TABLET BY MOUTH ONCE DAILY 30 tablet 0  . HYDROcodone-acetaminophen (NORCO/VICODIN) 5-325 MG tablet Take 1 tablet by mouth 2 (two) times daily as needed. 60 tablet 0  . omeprazole (PRILOSEC) 20 MG capsule TAKE ONE CAPSULE BY MOUTH TWICE DAILY 60 capsule 11  . pramoxine-hydrocortisone (ANALPRAM-HC) 1-1 % rectal cream Apply once a day for 5 days 30 g 0  . vitamin B-12 (CYANOCOBALAMIN) 100 MCG tablet Take 50 mcg by mouth daily.     . vitamin E 100 UNIT capsule Take 100 Units by mouth daily.      Marland Kitchen albuterol (PROAIR HFA) 108 (90 Base) MCG/ACT inhaler Inhale 2 puffs into the lungs every 6 (six) hours as needed. 1 Inhaler 3  . levofloxacin (LEVAQUIN) 500 MG tablet Take 1  tablet (500 mg total) by mouth daily. 7 tablet 0   No facility-administered medications prior to visit.    Allergies:  Allergies  Allergen Reactions  . Penicillins Hives      Review of Systems: See HPI for pertinent ROS. All other ROS negative.    Physical Exam: Blood pressure 134/86, pulse 60, temperature 97.9 F (36.6 C), temperature source Oral, resp. rate 18, weight 232 lb (105.235 kg)., Body mass index is 39.8 kg/(m^2). General:  Obese WF. Appears in no acute distress. HEENT: Normocephalic, atraumatic, eyes without discharge, sclera non-icteric, nares are without discharge. Bilateral auditory canals clear, TM's are without perforation, pearly grey and translucent with reflective cone of light bilaterally. Oral cavity moist, posterior pharynx without exudate, erythema, peritonsillar abscess.  Neck: Supple. No thyromegaly. No lymphadenopathy. Lungs: Clear bilaterally to auscultation without wheezes, rales, or rhonchi. Breathing is unlabored. Currently her lungs are clear with no wheezes or rhonchi. As well she has  good air movement and breath sounds throughout. Heart: Regular rhythm. No murmurs, rubs, or gallops. Msk:  Strength and tone normal for age. Extremities/Skin: Warm and dry. Neuro: Alert and oriented X 3. Moves all extremities spontaneously. Gait is normal. CNII-XII grossly in tact. Psych:  Responds to questions appropriately with a normal affect.     ASSESSMENT AND PLAN:  75 y.o.  year old female with    Bacterial respiratory infection - doxycycline (VIBRA-TABS) 100 MG tablet; Take 1 tablet (100 mg total) by mouth 2 (two) times daily.  Dispense: 20 tablet; Refill: 0  Acute bronchitis, unspecified organism She is to take the antibiotic as directed and complete all of it. She is to take the prednisone daily for 5 days. She is to continue the albuterol every 4 hours as needed.  If breathing worsens then follow-up or if does not return to baseline in one week and  follow-up with [place] as well. - doxycycline (VIBRA-TABS) 100 MG tablet; Take 1 tablet (100 mg total) by mouth 2 (two) times daily.  Dispense: 20 tablet; Refill: 0 - predniSONE (DELTASONE) 20 MG tablet; Take 1 daily for 5 days  Dispense: 5 tablet; Refill: 0 - albuterol (PROAIR HFA) 108 (90 Base) MCG/ACT inhaler; Inhale 2 puffs into the lungs every 6 (six) hours as needed.  Dispense: 1 Inhaler; Refill: 3   Signed, [address] [place], [place], [facility] 11/02/2015 12:53 PM

## 2015-11-16 ENCOUNTER — Other Ambulatory Visit: Payer: Self-pay | Admitting: Nurse Practitioner

## 2015-11-16 MED ORDER — ESCITALOPRAM OXALATE 10 MG PO TABS: 10.0000 mg | ORAL_TABLET | Freq: Every day | ORAL | Status: DC

## 2015-11-19 ENCOUNTER — Telehealth: Payer: Self-pay | Admitting: Family Medicine

## 2015-11-19 ENCOUNTER — Other Ambulatory Visit: Payer: Self-pay | Admitting: Family Medicine

## 2015-11-19 MED ORDER — BENZONATATE 200 MG PO CAPS: 200.0000 mg | ORAL_CAPSULE | Freq: Two times a day (BID) | ORAL | Status: DC | PRN

## 2015-11-19 MED ORDER — HYDROCODONE-ACETAMINOPHEN 5-325 MG PO TABS: 1.0000 | ORAL_TABLET | Freq: Two times a day (BID) | ORAL | Status: DC | PRN

## 2015-11-19 NOTE — Telephone Encounter (Signed)
Use the albuterol 2 puffs every 6 hours in case cough secondary to wheeze. Can add Tessalon as cough suppressant. Tessalon 200mg  1 Q 12 H PRN cough # 40 + 1

## 2015-11-19 NOTE — Telephone Encounter (Signed)
Prescription printed and patient made aware to come to office to pick up after 2pm on 11/19/2015.

## 2015-11-19 NOTE — Telephone Encounter (Signed)
Feeling better but cough is returning, mostly after being outside.  What can you recommend?

## 2015-11-19 NOTE — Telephone Encounter (Signed)
Ok to refill 

## 2015-11-19 NOTE — Telephone Encounter (Signed)
HYDROcodone-acetaminophen (NORCO/VICODIN) 5-325 MG tablet  CB# [phone]

## 2015-11-19 NOTE — Telephone Encounter (Signed)
ok 

## 2015-11-19 NOTE — Telephone Encounter (Signed)
Left pt voice message with recommendations.  RX to pharmacy

## 2015-12-04 ENCOUNTER — Other Ambulatory Visit: Payer: Self-pay | Admitting: Family Medicine

## 2015-12-04 MED ORDER — BENZONATATE 200 MG PO CAPS: 200.0000 mg | ORAL_CAPSULE | Freq: Two times a day (BID) | ORAL | Status: DC | PRN

## 2015-12-10 ENCOUNTER — Ambulatory Visit: Payer: PPO | Admitting: Hematology & Oncology

## 2015-12-10 ENCOUNTER — Other Ambulatory Visit: Payer: PPO

## 2015-12-21 ENCOUNTER — Telehealth: Payer: Self-pay | Admitting: Family Medicine

## 2015-12-21 NOTE — Telephone Encounter (Signed)
Ok to refill 

## 2015-12-21 NOTE — Telephone Encounter (Signed)
Pt is calling for a refill of Hydrocodone 5-325 Please call when ready to p/u @ [phone]

## 2015-12-21 NOTE — Telephone Encounter (Signed)
ok 

## 2015-12-22 MED ORDER — HYDROCODONE-ACETAMINOPHEN 5-325 MG PO TABS: 1.0000 | ORAL_TABLET | Freq: Two times a day (BID) | ORAL | Status: DC | PRN

## 2015-12-22 NOTE — Telephone Encounter (Signed)
RX printed, left up front and patient aware to pick up after 2 via vm

## 2015-12-23 ENCOUNTER — Other Ambulatory Visit: Payer: Self-pay | Admitting: *Deleted

## 2015-12-23 DIAGNOSIS — C50912 Malignant neoplasm of unspecified site of left female breast: Secondary | ICD-10-CM

## 2015-12-24 ENCOUNTER — Encounter: Payer: Self-pay | Admitting: Hematology & Oncology

## 2015-12-24 ENCOUNTER — Ambulatory Visit (HOSPITAL_BASED_OUTPATIENT_CLINIC_OR_DEPARTMENT_OTHER): Payer: PPO | Admitting: Hematology & Oncology

## 2015-12-24 ENCOUNTER — Other Ambulatory Visit (HOSPITAL_BASED_OUTPATIENT_CLINIC_OR_DEPARTMENT_OTHER): Payer: PPO

## 2015-12-24 VITALS — BP 155/69 | HR 62 | Temp 97.9°F | Resp 16 | Ht 64.0 in | Wt 228.0 lb

## 2015-12-24 DIAGNOSIS — C50912 Malignant neoplasm of unspecified site of left female breast: Secondary | ICD-10-CM | POA: Diagnosis not present

## 2015-12-24 DIAGNOSIS — C50012 Malignant neoplasm of nipple and areola, left female breast: Secondary | ICD-10-CM

## 2015-12-24 LAB — CBC WITH DIFFERENTIAL (CANCER CENTER ONLY)
BASO#: 0 10*3/uL (ref 0.0–0.2)
BASO%: 0.5 % (ref 0.0–2.0)
EOS ABS: 0.4 10*3/uL (ref 0.0–0.5)
EOS%: 6 % (ref 0.0–7.0)
HEMATOCRIT: 42.5 % (ref 34.8–46.6)
HGB: 14.3 g/dL (ref 11.6–15.9)
LYMPH#: 1.6 10*3/uL (ref 0.9–3.3)
LYMPH%: 27.1 % (ref 14.0–48.0)
MCH: 32.6 pg (ref 26.0–34.0)
MCHC: 33.6 g/dL (ref 32.0–36.0)
MCV: 97 fL (ref 81–101)
MONO#: 0.5 10*3/uL (ref 0.1–0.9)
MONO%: 8.2 % (ref 0.0–13.0)
NEUT#: 3.5 10*3/uL (ref 1.5–6.5)
NEUT%: 58.2 % (ref 39.6–80.0)
Platelets: 196 10*3/uL (ref 145–400)
RBC: 4.38 10*6/uL (ref 3.70–5.32)
RDW: 13 % (ref 11.1–15.7)
WBC: 6 10*3/uL (ref 3.9–10.0)

## 2015-12-24 LAB — COMPREHENSIVE METABOLIC PANEL
ALT: 12 U/L (ref 0–55)
ANION GAP: 7 meq/L (ref 3–11)
AST: 18 U/L (ref 5–34)
Albumin: 3.5 g/dL (ref 3.5–5.0)
Alkaline Phosphatase: 53 U/L (ref 40–150)
BILIRUBIN TOTAL: 0.97 mg/dL (ref 0.20–1.20)
BUN: 10.6 mg/dL (ref 7.0–26.0)
CALCIUM: 9.4 mg/dL (ref 8.4–10.4)
CHLORIDE: 106 meq/L (ref 98–109)
CO2: 30 mEq/L — ABNORMAL HIGH (ref 22–29)
CREATININE: 0.7 mg/dL (ref 0.6–1.1)
EGFR: 82 mL/min/{1.73_m2} — AB (ref >90)
Glucose: 97 mg/dl (ref 70–140)
Potassium: 4.4 mEq/L (ref 3.5–5.1)
Sodium: 143 mEq/L (ref 136–145)
TOTAL PROTEIN: 6.3 g/dL — AB (ref 6.4–8.3)

## 2015-12-24 NOTE — Progress Notes (Signed)
Hematology and Oncology Follow Up Visit  [name] [id number] Jan 30, 1941 75 y.o. 12/24/2015   Principle Diagnosis:  Stage IIB (T2 N1 M0) ductal carcinoma of the right breast.  Current Therapy:    Observation     Interim History:  Ms.  [name] is comes in for followup. We last saw her a year ago. She's doing well. 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 She had a good Mother's Day. She was down at the beach.  She is expecting her seventh great grandchild in November. She is quite happy about this.  She's had no problems with nausea  or vomiting.   She has had no rashes. She's had no leg swelling. She has occasional swelling of the left arm.  There's been no change in bowel or bladder habits.   She's had no fevers. She had no infection over wintertime.  Overall, her performance status is ECOG 1.    Medications:  Current outpatient prescriptions:  .  albuterol (PROAIR HFA) 108 (90 Base) MCG/ACT inhaler, Inhale 2 puffs into the lungs every 6 (six) hours as needed., Disp: 1 Inhaler, Rfl: 3 .  alendronate (FOSAMAX) 70 MG tablet, TAKE ONE TABLET BY MOUTH ONCE A WEEK IN THE MORNING WITH A GLASS OF WATER. 30 MINUTES BEFORE A MEAL OR BEVERAGE REMAIN UPRIGHT., Disp: 12 tablet, Rfl: 4 .  Cholecalciferol (VITAMIN D) 2000 UNITS tablet, Take 2,000 Units by mouth daily. 09-25-12 pt was instructed to increase from 1000 to 2000 units daily, Disp: , Rfl:  .  doxycycline (VIBRA-TABS) 100 MG tablet, Take 1 tablet (100 mg total) by mouth 2 (two) times daily., Disp: 20 tablet, Rfl: 0 .  escitalopram (LEXAPRO) 10 MG tablet, Take 1 tablet (10 mg total) by mouth daily., Disp: 30 tablet, Rfl: 2 .  fluorometholone (FML) 0.1 % ophthalmic suspension, , Disp: , Rfl:  .  HYDROcodone-acetaminophen (NORCO/VICODIN) 5-325 MG tablet, Take 1 tablet by mouth 2 (two) times daily as needed., Disp: 60 tablet, Rfl: 0 .  omeprazole (PRILOSEC) 20 MG capsule, TAKE ONE CAPSULE BY MOUTH  TWICE DAILY, Disp: 60 capsule, Rfl: 11 .  vitamin B-12 (CYANOCOBALAMIN) 100 MCG tablet, Take 50 mcg by mouth daily. , Disp: , Rfl:  .  vitamin E 100 UNIT capsule, Take 100 Units by mouth daily.  , Disp: , Rfl:   Allergies:  Allergies  Allergen Reactions  . Penicillins Hives    Past Medical History, Surgical history, Social history, and Family History were reviewed and updated.  Review of Systems: As above  Physical Exam:  height is 5\' 4"  (1.626 m) and weight is 228 lb (103.42 kg). Her oral temperature is 97.9 F (36.6 C). Her blood pressure is 155/69 and her pulse is 62. Her respiration is 16.   Somewhat obese white female. Her head and neck exam shows no ocular or oral lesions. There are no palpable cervical or supraclavicular lymph nodes. Her lungs are clear. Cardiac exam regular rate and rhythm with no murmurs, rubs or bruits.. Chest wall exam shows bilateral mastectomies. No bilateral axillary adenopathy is noted. Abdomen soft. Mildly obese. There is no palpable liver or spleen tip. Back exam no tenderness over the spine ribs or hips. Extremities shows no clubbing cyanosis or edema. She has some chronic mild non-pitting edema in the lower legs. Skin exam no rashes.  Lab Results  Component Value Date   WBC 6.0 12/24/2015   HGB 14.3 12/24/2015   HCT 42.5 12/24/2015  MCV 97 12/24/2015   PLT 196 12/24/2015     Chemistry      Component Value Date/Time   NA 142 09/08/2015 1038   K 4.2 09/08/2015 1038   CL 105 09/08/2015 1038   CO2 28 09/08/2015 1038   BUN 11 09/08/2015 1038   CREATININE 0.65 09/08/2015 1038   CREATININE 0.65 12/19/2014 0805      Component Value Date/Time   CALCIUM 8.6 09/08/2015 1038   ALKPHOS 46 09/08/2015 1038   AST 17 09/08/2015 1038   ALT 10 09/08/2015 1038   BILITOT 0.7 09/08/2015 1038         Impression and Plan: [name] is 75 year old white female with a history of stage IIB infiltrating ductal carcinoma the right breast. She did receive  some adjuvant chemotherapy. She had chemotherapy that was completed back in June of 2004.  We will go ahead and her back in another year.     Volanda Napoleon, MD 5/25/201711:42 AM

## 2016-02-01 ENCOUNTER — Telehealth: Payer: Self-pay | Admitting: Family Medicine

## 2016-02-01 NOTE — Telephone Encounter (Signed)
Ok to refill 

## 2016-02-01 NOTE — Telephone Encounter (Signed)
Pt is requesting a refill of Hydrocodone 5-325 [phone]

## 2016-02-03 MED ORDER — HYDROCODONE-ACETAMINOPHEN 5-325 MG PO TABS: 1.0000 | ORAL_TABLET | Freq: Two times a day (BID) | ORAL | Status: DC | PRN

## 2016-02-03 NOTE — Telephone Encounter (Signed)
RX printed, left up front and patient aware to pick up also appt made for med ck

## 2016-02-03 NOTE — Telephone Encounter (Signed)
NTBS, I have not seen on epic.  OK with temporary refill.

## 2016-02-11 ENCOUNTER — Encounter: Payer: Self-pay | Admitting: Family Medicine

## 2016-02-11 ENCOUNTER — Ambulatory Visit (INDEPENDENT_AMBULATORY_CARE_PROVIDER_SITE_OTHER): Payer: PPO | Admitting: Family Medicine

## 2016-02-11 VITALS — BP 150/90 | HR 74 | Temp 97.8°F | Resp 20 | Wt 231.0 lb

## 2016-02-11 DIAGNOSIS — M2042 Other hammer toe(s) (acquired), left foot: Secondary | ICD-10-CM

## 2016-02-11 DIAGNOSIS — M150 Primary generalized (osteo)arthritis: Secondary | ICD-10-CM

## 2016-02-11 DIAGNOSIS — J019 Acute sinusitis, unspecified: Secondary | ICD-10-CM | POA: Diagnosis not present

## 2016-02-11 DIAGNOSIS — R030 Elevated blood-pressure reading, without diagnosis of hypertension: Secondary | ICD-10-CM | POA: Diagnosis not present

## 2016-02-11 DIAGNOSIS — M159 Polyosteoarthritis, unspecified: Secondary | ICD-10-CM

## 2016-02-11 DIAGNOSIS — M25512 Pain in left shoulder: Secondary | ICD-10-CM | POA: Diagnosis not present

## 2016-02-11 DIAGNOSIS — IMO0001 Reserved for inherently not codable concepts without codable children: Secondary | ICD-10-CM

## 2016-02-11 MED ORDER — AZITHROMYCIN 250 MG PO TABS: ORAL_TABLET | ORAL | Status: DC

## 2016-02-11 MED ORDER — DICLOFENAC SODIUM 1 % TD GEL: 4.0000 g | Freq: Four times a day (QID) | TRANSDERMAL | Status: DC

## 2016-02-11 NOTE — Progress Notes (Signed)
Subjective:    Patient ID: [name], female    DOB: Mar 05, 1941, 75 y.o.   MRN: [id number]  HPI  Patient is a very pleasant 75 year old white female who presents today for follow-up. I asked her to come in because we had not seen her in quite some time. We have been periodically given her pain medication. She takes hydrocodone 5/325 1/2-1 pill a day for arthritis. She has severe osteoarthritis in the DIP and PIP joints on both hands with radial deviation distal to the DIP joints in multiple fingers. She is unable to make a fist without severe pain. She also has a hammertoe in her left foot which causes significant pain with walking and ambulation in additional to general aches and pains in her back and in her knees. She uses the hydrocodone sparingly. She avoids arthritis medication due to acid reflux but she is never tied topical NSAIDs such as Voltaren gel. Her blood pressure today is elevated at 150/90. She states that it runs better at home but she has not checked it recently. Past medical history is significant for breast cancer status post mastectomy. She follows up with her oncologist regularly. In February, her cholesterol was checked and I reviewed those bites today it was outstanding. In May, her CBC and CMP were checked and all lab values were normal. She does complain of pain in her left maxillary sinus 7 days. She reports postnasal drip, subjective fevers, and headaches along with some dizziness and pressure in her left ear. Past Medical History  Diagnosis Date  . Anemia   . Depression   . Arthritis   . Osteoporosis   . Vitamin D deficiency   . Diverticulosis   . Breast cancer [facility])    Past Surgical History  Procedure Laterality Date  . Abdominal hysterectomy    . Appendectomy    . Tonsillectomy    . Cholecystectomy    . Mastectomy     Current Outpatient Prescriptions on File Prior to Visit  Medication Sig Dispense Refill  . albuterol (PROAIR HFA) 108 (90 Base)  MCG/ACT inhaler Inhale 2 puffs into the lungs every 6 (six) hours as needed. 1 Inhaler 3  . Cholecalciferol (VITAMIN D) 2000 UNITS tablet Take 2,000 Units by mouth daily. 09-25-12 pt was instructed to increase from 1000 to 2000 units daily    . escitalopram (LEXAPRO) 10 MG tablet Take 1 tablet (10 mg total) by mouth daily. 30 tablet 2  . fluorometholone (FML) 0.1 % ophthalmic suspension     . HYDROcodone-acetaminophen (NORCO/VICODIN) 5-325 MG tablet Take 1 tablet by mouth 2 (two) times daily as needed. 60 tablet 0  . omeprazole (PRILOSEC) 20 MG capsule TAKE ONE CAPSULE BY MOUTH TWICE DAILY 60 capsule 11  . vitamin B-12 (CYANOCOBALAMIN) 100 MCG tablet Take 50 mcg by mouth daily.     . vitamin E 100 UNIT capsule Take 100 Units by mouth daily.      Marland Kitchen alendronate (FOSAMAX) 70 MG tablet TAKE ONE TABLET BY MOUTH ONCE A WEEK IN THE MORNING WITH A GLASS OF WATER. 30 MINUTES BEFORE A MEAL OR BEVERAGE REMAIN UPRIGHT. (Patient not taking: Reported on 02/11/2016) 12 tablet 4   No current facility-administered medications on file prior to visit.   Allergies  Allergen Reactions  . Penicillins Hives   Social History   Social History  . Marital Status: Divorced    Spouse Name: N/A  . Number of Children: N/A  . Years of Education: N/A  Occupational History  . Not on file.   Social History Main Topics  . Smoking status: Never Smoker   . Smokeless tobacco: Never Used     Comment: never used tobacco  . Alcohol Use: No  . Drug Use: No  . Sexual Activity: Not on file   Other Topics Concern  . Not on file   Social History Narrative        Review of Systems  All other systems reviewed and are negative.      Objective:   Physical Exam  Constitutional: She appears well-developed and well-nourished.  HENT:  Right Ear: External ear normal.  Left Ear: External ear normal.  Nose: Left sinus exhibits maxillary sinus tenderness and frontal sinus tenderness.  Mouth/Throat: Oropharynx is clear  and moist. No oropharyngeal exudate.  Eyes: Conjunctivae are normal.  Cardiovascular: Normal rate, regular rhythm and normal heart sounds.   Pulmonary/Chest: Effort normal and breath sounds normal. No respiratory distress. She has no wheezes.  Musculoskeletal:       Left shoulder: She exhibits decreased range of motion, tenderness, pain and decreased strength.  Vitals reviewed.         Assessment & Plan:  Acute rhinosinusitis - Plan: azithromycin (ZITHROMAX) 250 MG tablet  Hammer toe of left foot  Primary osteoarthritis involving multiple joints  Elevated blood pressure  Left shoulder pain Patient has severe pain in her left shoulder on exam. Abduction is limited to 70. She is unable to tolerate NSAIDs and would like a cortisone injection in her left shoulder. Using sterile technique, I injected the left subacromial space with 2 mL of lidocaine, 2 mL of Marcaine, and 2 mL of 40 mg per mL Kenalog. Patient tolerated the procedure well without complication area just significant osteoarthritis in the DIP joints on both hands with radial deviation of the distal fingertips. She cannot tolerate oral NSAIDs. I will gladly continue to refill her hydrocodone which she is only taking 1/2-1 pill per day. Also recommended trying Voltaren gel 2 g to 4 g 4 times a day as needed for pain. Her blood pressures elevated. I recommended that she check her blood pressure daily at home. Notify me the values in one week and we can adjust medication if necessary if greater than 140/90. She also appears to have a sinus infection on exam. I'll start a Z-Pak given her allergy to penicillin.

## 2016-02-29 ENCOUNTER — Other Ambulatory Visit: Payer: Self-pay | Admitting: Family Medicine

## 2016-03-18 ENCOUNTER — Other Ambulatory Visit: Payer: Self-pay | Admitting: Hematology & Oncology

## 2016-03-23 ENCOUNTER — Telehealth: Payer: Self-pay | Admitting: Family Medicine

## 2016-03-23 NOTE — Telephone Encounter (Signed)
Ok to refill 

## 2016-03-23 NOTE — Telephone Encounter (Signed)
Patient needs rx for hydrocodone

## 2016-03-24 MED ORDER — HYDROCODONE-ACETAMINOPHEN 5-325 MG PO TABS: 1.0000 | ORAL_TABLET | Freq: Two times a day (BID) | ORAL | 0 refills | Status: DC | PRN

## 2016-03-24 NOTE — Telephone Encounter (Signed)
ok 

## 2016-03-24 NOTE — Telephone Encounter (Signed)
RX printed, left up front and patient aware to pick up after 2 pm  

## 2016-04-04 DIAGNOSIS — Z961 Presence of intraocular lens: Secondary | ICD-10-CM | POA: Diagnosis not present

## 2016-04-14 ENCOUNTER — Other Ambulatory Visit: Payer: Self-pay | Admitting: Family Medicine

## 2016-04-15 NOTE — Telephone Encounter (Signed)
RX filled per protocol 

## 2016-05-17 ENCOUNTER — Telehealth: Payer: Self-pay | Admitting: Family Medicine

## 2016-05-17 MED ORDER — HYDROCODONE-ACETAMINOPHEN 5-325 MG PO TABS: 1.0000 | ORAL_TABLET | Freq: Two times a day (BID) | ORAL | 0 refills | Status: DC | PRN

## 2016-05-17 NOTE — Telephone Encounter (Signed)
Patient would like rx for her hydrocodone  [phone]

## 2016-05-17 NOTE — Telephone Encounter (Signed)
ok 

## 2016-05-17 NOTE — Telephone Encounter (Signed)
Ok to refill 

## 2016-05-17 NOTE — Telephone Encounter (Signed)
RX printed, left up front and patient aware to pick up in am via vm 

## 2016-07-06 ENCOUNTER — Encounter: Payer: Self-pay | Admitting: Physician Assistant

## 2016-07-06 ENCOUNTER — Ambulatory Visit (INDEPENDENT_AMBULATORY_CARE_PROVIDER_SITE_OTHER): Payer: PPO | Admitting: Physician Assistant

## 2016-07-06 VITALS — BP 148/80 | HR 58 | Temp 97.7°F | Wt 227.0 lb

## 2016-07-06 DIAGNOSIS — J988 Other specified respiratory disorders: Secondary | ICD-10-CM | POA: Diagnosis not present

## 2016-07-06 DIAGNOSIS — B9689 Other specified bacterial agents as the cause of diseases classified elsewhere: Principal | ICD-10-CM

## 2016-07-06 MED ORDER — HYDROCODONE-ACETAMINOPHEN 5-325 MG PO TABS: 1.0000 | ORAL_TABLET | Freq: Two times a day (BID) | ORAL | 0 refills | Status: DC | PRN

## 2016-07-06 MED ORDER — PREDNISONE 20 MG PO TABS: 20.0000 mg | ORAL_TABLET | Freq: Every day | ORAL | 0 refills | Status: DC

## 2016-07-06 MED ORDER — AZITHROMYCIN 250 MG PO TABS: ORAL_TABLET | ORAL | 0 refills | Status: DC

## 2016-07-06 NOTE — Progress Notes (Signed)
Patient ID: [name] MRN: [id number], DOB: June 18, 1941, 75 y.o. Date of Encounter: 07/06/2016, 1:17 PM    Chief Complaint:  Chief Complaint  Patient presents with  .   . Cough    x1 wk  . Wheezing    x1wk     HPI: 75 y.o. year old female presents with above.   Says that she has been having coughing for a week now. Also feeling some pressure around left eye in the left cheek and the left for head. Also has had some wheezing. Has had to use her albuterol inhaler sometime over the last few days. No known fevers or chills. Also wants to pick up her hydrocodone refill while she is here. Uses this chronically for arthritis pain and low back pain.     Home Meds:   Outpatient Medications Prior to Visit  Medication Sig Dispense Refill  . albuterol (PROAIR HFA) 108 (90 Base) MCG/ACT inhaler Inhale 2 puffs into the lungs every 6 (six) hours as needed. 1 Inhaler 3  . Cholecalciferol (VITAMIN D) 2000 UNITS tablet Take 2,000 Units by mouth daily. 09-25-12 pt was instructed to increase from 1000 to 2000 units daily    . diclofenac sodium (VOLTAREN) 1 % GEL Apply 4 g topically 4 (four) times daily. 100 g 1  . escitalopram (LEXAPRO) 10 MG tablet TAKE ONE TABLET BY MOUTH ONCE DAILY 30 tablet 2  . omeprazole (PRILOSEC) 20 MG capsule TAKE ONE CAPSULE BY MOUTH TWICE DAILY 60 capsule 11  . vitamin B-12 (CYANOCOBALAMIN) 100 MCG tablet Take 50 mcg by mouth daily.     . vitamin E 100 UNIT capsule Take 100 Units by mouth daily.      Marland Kitchen HYDROcodone-acetaminophen (NORCO/VICODIN) 5-325 MG tablet Take 1 tablet by mouth 2 (two) times daily as needed. 60 tablet 0  . alendronate (FOSAMAX) 70 MG tablet TAKE ONE TABLET BY MOUTH ONCE A WEEK IN THE MORNING WITH A GLASS OF WATER. 30 MINUTES BEFORE A MEAL OR BEVERAGE. REMAIN UPRIGHT. (Patient not taking: Reported on 07/06/2016) 12 tablet 3  . azithromycin (ZITHROMAX) 250 MG tablet 2 tabs poqday1, 1 tab poqday 2-5 (Patient not taking: Reported on 07/06/2016) 6  tablet 0  . fluorometholone (FML) 0.1 % ophthalmic suspension      No facility-administered medications prior to visit.     Allergies:  Allergies  Allergen Reactions  . Penicillins Hives      Review of Systems: See HPI for pertinent ROS. All other ROS negative.    Physical Exam: Blood pressure (!) 148/80, pulse (!) 58, temperature 97.7 F (36.5 C), temperature source Oral, weight 227 lb (103 kg), SpO2 98 %., Body mass index is 38.96 kg/m. General:  Obese white female. Appears in no acute distress. HEENT: Normocephalic, atraumatic, eyes without discharge, sclera non-icteric, nares are without discharge. Bilateral auditory canals clear, TM's are without perforation, pearly grey and translucent with reflective cone of light bilaterally. Oral cavity moist, posterior pharynx without exudate, erythema, peritonsillar abscess. No tenderness with percussion to frontal or maxillary sinuses.  Neck: Supple. No thyromegaly. No lymphadenopathy. Lungs: Clear bilaterally to auscultation without wheezes, rales, or rhonchi. Breathing is unlabored. At present her lungs sound clear and I hear no wheezing at this time. Heart: Regular rhythm. No murmurs, rubs, or gallops. Msk:  Strength and tone normal for age. Extremities/Skin: Warm and dry.  Neuro: Alert and oriented X 3. Moves all extremities spontaneously. Gait is normal. CNII-XII grossly in tact. Psych:  Responds to questions  appropriately with a normal affect.     ASSESSMENT AND PLAN:  75 y.o. year old female with  1. Bacterial respiratory infection Her lungs sound clear on exam at this time. However she reports having some intermittent wheezing and having to use her albuterol inhaler recently. Therefore, will add low-dose prednisone 20 mg for 5 days. As well she is to take the antibiotic as directed. Follow-up if symptoms do not resolve after completion of these medications. - azithromycin (ZITHROMAX) 250 MG tablet; Day 1: Take 2 daily.  Days  -5: Take 1 daily.  Dispense: 6 tablet; Refill: 0 - predniSONE (DELTASONE) 20 MG tablet; Take 1 tablet (20 mg total) by mouth daily with breakfast.  Dispense: 5 tablet; Refill: 0   Signed, [address] [place], [place], [facility] 07/06/2016 1:17 PM

## 2016-07-18 ENCOUNTER — Ambulatory Visit (INDEPENDENT_AMBULATORY_CARE_PROVIDER_SITE_OTHER): Payer: PPO | Admitting: Physician Assistant

## 2016-07-18 ENCOUNTER — Encounter: Payer: Self-pay | Admitting: Physician Assistant

## 2016-07-18 VITALS — BP 130/84 | HR 61 | Temp 97.7°F | Resp 16 | Wt 225.0 lb

## 2016-07-18 DIAGNOSIS — M79675 Pain in left toe(s): Secondary | ICD-10-CM

## 2016-07-18 DIAGNOSIS — J988 Other specified respiratory disorders: Secondary | ICD-10-CM

## 2016-07-18 DIAGNOSIS — B9689 Other specified bacterial agents as the cause of diseases classified elsewhere: Secondary | ICD-10-CM

## 2016-07-18 MED ORDER — LEVOFLOXACIN 750 MG PO TABS: 750.0000 mg | ORAL_TABLET | Freq: Every day | ORAL | 0 refills | Status: DC

## 2016-07-18 NOTE — Progress Notes (Signed)
Patient ID: [name] MRN: [id number], DOB: [birth year], 75 y.o. Date of Encounter: 07/18/2016, 4:31 PM    Chief Complaint:  Chief Complaint  Patient presents with  . left foot pain     HPI: 75 y.o. year old female presents with above.   Noted that she had recent OV with me regarding bacterial resp inf. She says that she took med as directed--has been finished/off med for 3 days. Symptoms were all better but she is starting to feel symptoms starting to return--starting to feel some congestion in head.   Says that she has been noticing some issue with her left foot/second and third toes for about a month---has noticed some pain there and redness there. However it has recently worsened so came in. Has no diagnosed history of gout. Had no trauma or injury to the foot/toes as far as she knows.     Home Meds:   Outpatient Medications Prior to Visit  Medication Sig Dispense Refill  . albuterol (PROAIR HFA) 108 (90 Base) MCG/ACT inhaler Inhale 2 puffs into the lungs every 6 (six) hours as needed. 1 Inhaler 3  . alendronate (FOSAMAX) 70 MG tablet TAKE ONE TABLET BY MOUTH ONCE A WEEK IN THE MORNING WITH A GLASS OF WATER. 30 MINUTES BEFORE A MEAL OR BEVERAGE. REMAIN UPRIGHT. 12 tablet 3  . azithromycin (ZITHROMAX) 250 MG tablet 2 tabs poqday1, 1 tab poqday 2-5 6 tablet 0  . Cholecalciferol (VITAMIN D) 2000 UNITS tablet Take 2,000 Units by mouth daily. 09-25-12 pt was instructed to increase from 1000 to 2000 units daily    . diclofenac sodium (VOLTAREN) 1 % GEL Apply 4 g topically 4 (four) times daily. 100 g 1  . escitalopram (LEXAPRO) 10 MG tablet TAKE ONE TABLET BY MOUTH ONCE DAILY 30 tablet 2  . fluorometholone (FML) 0.1 % ophthalmic suspension     . HYDROcodone-acetaminophen (NORCO/VICODIN) 5-325 MG tablet Take 1 tablet by mouth 2 (two) times daily as needed. 60 tablet 0  . omeprazole (PRILOSEC) 20 MG capsule TAKE ONE CAPSULE BY MOUTH TWICE DAILY 60 capsule 11  . vitamin B-12  (CYANOCOBALAMIN) 100 MCG tablet Take 50 mcg by mouth daily.     . vitamin E 100 UNIT capsule Take 100 Units by mouth daily.      Marland Kitchen azithromycin (ZITHROMAX) 250 MG tablet Day 1: Take 2 daily.  Days -5: Take 1 daily. (Patient not taking: Reported on 07/18/2016) 6 tablet 0  . predniSONE (DELTASONE) 20 MG tablet Take 1 tablet (20 mg total) by mouth daily with breakfast. (Patient not taking: Reported on 07/18/2016) 5 tablet 0   No facility-administered medications prior to visit.     Allergies:  Allergies  Allergen Reactions  . Penicillins Hives      Review of Systems: See HPI for pertinent ROS. All other ROS negative.    Physical Exam: Blood pressure 130/84, pulse 61, temperature 97.7 F (36.5 C), temperature source Oral, resp. rate 16, weight 225 lb (102.1 kg), SpO2 97 %., Body mass index is 38.62 kg/m. General:  Obese WF. Appears in no acute distress. Neck: Supple. No thyromegaly. No lymphadenopathy. Lungs: Clear bilaterally to auscultation without wheezes, rales, or rhonchi. Breathing is unlabored. Heart: Regular rhythm. No murmurs, rubs, or gallops. Msk:  Strength and tone normal for age. Extremities/Skin: Left Foot: 3rd toe: There is erythema around medial and lateral edges of toenail.  2nd toe: PIP joint is raised, flexed. This joint has erythema (but may be secondary to toe  rubbing on shoe) Neuro: Alert and oriented X 3. Moves all extremities spontaneously. Gait is normal. CNII-XII grossly in tact. Psych:  Responds to questions appropriately with a normal affect.     ASSESSMENT AND PLAN:  75 y.o. year old female with  1. Pain in toe of left foot Given the fact that she has been noticing something going on with her foot for about a month and the exam findings-- this is more consistent with cellulitis rather than gout. Therefore, willl go ahead and start empiric antibiotic. Will check labs. She has penicillin allergy.  Given that she has this in addition to recurring  respiratory symptoms will treat both with Levaquin.  Will follow up with her once I get the lab results. Told her to monitor and look at her toe daily and if the redness worsens or spreads-- to follow-up immediately. She voices understanding and agrees. - CBC with Differential/Platelet - COMPLETE METABOLIC PANEL WITH GFR - Uric acid - levofloxacin (LEVAQUIN) 750 MG tablet; Take 1 tablet (750 mg total) by mouth daily.  Dispense: 7 tablet; Refill: 0  2. Bacterial respiratory infection - levofloxacin (LEVAQUIN) 750 MG tablet; Take 1 tablet (750 mg total) by mouth daily.  Dispense: 7 tablet; Refill: 0   Signed, [address] [place], [place], [facility] 07/18/2016 4:31 PM

## 2016-07-19 LAB — CBC WITH DIFFERENTIAL/PLATELET
BASOS PCT: 0 %
Basophils Absolute: 0 cells/uL (ref 0–200)
EOS ABS: 243 {cells}/uL (ref 15–500)
EOS PCT: 3 %
HCT: 40.9 % (ref 35.0–45.0)
Hemoglobin: 13.5 g/dL (ref 12.0–15.0)
Lymphocytes Relative: 29 %
Lymphs Abs: 2349 cells/uL (ref 850–3900)
MCH: 31.9 pg (ref 27.0–33.0)
MCHC: 33 g/dL (ref 32.0–36.0)
MCV: 96.7 fL (ref 80.0–100.0)
MONOS PCT: 9 %
MPV: 9.2 fL (ref 7.5–12.5)
Monocytes Absolute: 729 cells/uL (ref 200–950)
NEUTROS ABS: 4779 {cells}/uL (ref 1500–7800)
Neutrophils Relative %: 59 %
PLATELETS: 246 10*3/uL (ref 140–400)
RBC: 4.23 MIL/uL (ref 3.80–5.10)
RDW: 12.8 % (ref 11.0–15.0)
WBC: 8.1 10*3/uL (ref 3.8–10.8)

## 2016-07-19 LAB — URIC ACID: Uric Acid, Serum: 4.1 mg/dL (ref 2.5–7.0)

## 2016-07-19 LAB — COMPLETE METABOLIC PANEL WITH GFR
ALT: 11 U/L (ref 6–29)
AST: 22 U/L (ref 10–35)
Albumin: 3.6 g/dL (ref 3.6–5.1)
Alkaline Phosphatase: 44 U/L (ref 33–130)
BILIRUBIN TOTAL: 0.6 mg/dL (ref 0.2–1.2)
BUN: 10 mg/dL (ref 7–25)
CHLORIDE: 105 mmol/L (ref 98–110)
CO2: 29 mmol/L (ref 20–31)
CREATININE: 0.63 mg/dL (ref 0.60–0.93)
Calcium: 8.4 mg/dL — ABNORMAL LOW (ref 8.6–10.4)
GFR, Est African American: >89 mL/min (ref >=60)
GFR, Est Non African American: 87 mL/min (ref >=60)
GLUCOSE: 92 mg/dL (ref 70–99)
Potassium: 3.9 mmol/L (ref 3.5–5.3)
SODIUM: 141 mmol/L (ref 135–146)
TOTAL PROTEIN: 5.8 g/dL — AB (ref 6.1–8.1)

## 2016-07-28 ENCOUNTER — Telehealth: Payer: Self-pay

## 2016-07-28 NOTE — Telephone Encounter (Signed)
Reviewed her office visit with me and her lab results from 07/18/16. I then called patient directly to see how things had been since taking the Levaquin. She says that the toes still look pink and they still look swollen.  However says that they do not hurt as bad as they did.  Told her that we will obtain x-ray and schedule follow-up OV.  Place order for x-ray of foot

## 2016-07-28 NOTE — Telephone Encounter (Signed)
Pt called and states her toe is red and discolored it is sore and still hurts.  Pt lab results were normal so pt is wondering what could be going on  Pls advise

## 2016-07-29 ENCOUNTER — Ambulatory Visit
Admission: RE | Admit: 2016-07-29 | Discharge: 2016-07-29 | Disposition: A | Discharge status: Home / Self Care | Payer: PPO | Source: Ambulatory Visit | Attending: Physician Assistant | Admitting: Physician Assistant

## 2016-07-29 ENCOUNTER — Other Ambulatory Visit: Payer: Self-pay

## 2016-07-29 DIAGNOSIS — M79672 Pain in left foot: Secondary | ICD-10-CM | POA: Diagnosis not present

## 2016-07-29 DIAGNOSIS — M7989 Other specified soft tissue disorders: Secondary | ICD-10-CM

## 2016-07-29 MED ORDER — SULFAMETHOXAZOLE-TRIMETHOPRIM 800-160 MG PO TABS: ORAL_TABLET | ORAL | 0 refills | Status: DC

## 2016-07-29 MED ORDER — MELOXICAM 7.5 MG PO TABS: 7.5000 mg | ORAL_TABLET | Freq: Two times a day (BID) | ORAL | 0 refills | Status: DC

## 2016-07-29 NOTE — Telephone Encounter (Signed)
Xray order has been placed

## 2016-08-09 ENCOUNTER — Telehealth: Payer: Self-pay | Admitting: Family Medicine

## 2016-08-09 MED ORDER — HYDROCODONE-ACETAMINOPHEN 5-325 MG PO TABS: 1.0000 | ORAL_TABLET | Freq: Two times a day (BID) | ORAL | 0 refills | Status: DC | PRN

## 2016-08-09 NOTE — Telephone Encounter (Signed)
Okay, if the patient has not been seen in 6 months she is due.

## 2016-08-09 NOTE — Telephone Encounter (Signed)
Ok to refill 

## 2016-08-09 NOTE — Telephone Encounter (Signed)
Patient calling to get rx for hydrocodone  [phone]

## 2016-08-10 NOTE — Telephone Encounter (Signed)
RX printed, left up front and patient aware to pick up and aware to make appt for med check with Dr. Dennard Schaumann

## 2016-08-17 ENCOUNTER — Ambulatory Visit: Payer: PPO | Admitting: Physician Assistant

## 2016-08-22 ENCOUNTER — Other Ambulatory Visit: Payer: Self-pay | Admitting: Hematology & Oncology

## 2016-09-15 ENCOUNTER — Ambulatory Visit (INDEPENDENT_AMBULATORY_CARE_PROVIDER_SITE_OTHER): Payer: PPO | Admitting: Physician Assistant

## 2016-09-15 ENCOUNTER — Encounter: Payer: Self-pay | Admitting: Physician Assistant

## 2016-09-15 VITALS — BP 128/74 | HR 68 | Temp 97.8°F | Resp 16 | Wt 233.4 lb

## 2016-09-15 DIAGNOSIS — B379 Candidiasis, unspecified: Secondary | ICD-10-CM | POA: Diagnosis not present

## 2016-09-15 DIAGNOSIS — F419 Anxiety disorder, unspecified: Secondary | ICD-10-CM | POA: Diagnosis not present

## 2016-09-15 DIAGNOSIS — L03032 Cellulitis of left toe: Secondary | ICD-10-CM | POA: Diagnosis not present

## 2016-09-15 DIAGNOSIS — J0100 Acute maxillary sinusitis, unspecified: Secondary | ICD-10-CM | POA: Diagnosis not present

## 2016-09-15 MED ORDER — FLUCONAZOLE 150 MG PO TABS: 150.0000 mg | ORAL_TABLET | Freq: Once | ORAL | 0 refills | Status: DC

## 2016-09-15 MED ORDER — FLUCONAZOLE 150 MG PO TABS: ORAL_TABLET | ORAL | 0 refills | Status: DC

## 2016-09-15 MED ORDER — PREDNISONE 20 MG PO TABS: ORAL_TABLET | ORAL | 0 refills | Status: DC

## 2016-09-15 MED ORDER — CLOTRIMAZOLE-BETAMETHASONE 1-0.05 % EX CREA: 1.0000 "application " | TOPICAL_CREAM | Freq: Two times a day (BID) | CUTANEOUS | 0 refills | Status: DC

## 2016-09-15 MED ORDER — ALPRAZOLAM 0.25 MG PO TABS: 0.2500 mg | ORAL_TABLET | Freq: Every day | ORAL | 0 refills | Status: DC | PRN

## 2016-09-16 NOTE — Progress Notes (Signed)
Patient ID: ARRION STARY MRN: [id number], DOB: [birth year], 76 y.o. Date of Encounter: @DATE @  Chief Complaint:  Chief Complaint  Patient presents with  . left toe pain    HPI: 76 y.o. year old female  presents with above.   She is going to [place] in a couple of weeks. Wanted to get several things evaluated before she goes.   She will be flying. Has only flown once before and that was about 30 years ago. Needs med to help her relax with the flight.   Recetly took antibiotics. Is having a little vaginal burning/irritation--needs med for yeast infxn  Has been having some "stuffiness" in her maxilllary sinus regions. Not blowing out any thick dark mucus.  Having some wheezing--having to use Albuterol---coughoing up no thick dark phlegm "Wanted to get it calmed down before the trip"  Has some redness on left 3rd toe.    Past Medical History:  Diagnosis Date  . Anemia   . Arthritis   . Breast cancer ([place])   . Depression   . Diverticulosis   . Osteoporosis   . Vitamin D deficiency      Home Meds: Outpatient Medications Prior to Visit  Medication Sig Dispense Refill  . albuterol (PROAIR HFA) 108 (90 Base) MCG/ACT inhaler Inhale 2 puffs into the lungs every 6 (six) hours as needed. 1 Inhaler 3  . alendronate (FOSAMAX) 70 MG tablet TAKE ONE TABLET BY MOUTH ONCE A WEEK IN THE MORNING WITH A GLASS OF WATER. 30 MINUTES BEFORE A MEAL OR BEVERAGE. REMAIN UPRIGHT. 12 tablet 3  . Cholecalciferol (VITAMIN D) 2000 UNITS tablet Take 2,000 Units by mouth daily. 09-25-12 pt was instructed to increase from 1000 to 2000 units daily    . diclofenac sodium (VOLTAREN) 1 % GEL Apply 4 g topically 4 (four) times daily. 100 g 1  . escitalopram (LEXAPRO) 10 MG tablet TAKE ONE TABLET BY MOUTH ONCE DAILY 30 tablet 2  . fluorometholone (FML) 0.1 % ophthalmic suspension     . HYDROcodone-acetaminophen (NORCO/VICODIN) 5-325 MG tablet Take 1 tablet by mouth 2 (two) times daily as needed. 60 tablet 0   . levofloxacin (LEVAQUIN) 750 MG tablet Take 1 tablet (750 mg total) by mouth daily. 7 tablet 0  . meloxicam (MOBIC) 7.5 MG tablet Take 1 tablet (7.5 mg total) by mouth 2 (two) times daily with a meal. 30 tablet 0  . omeprazole (PRILOSEC) 20 MG capsule TAKE ONE CAPSULE BY MOUTH TWICE DAILY 60 capsule 11  . sulfamethoxazole-trimethoprim (BACTRIM DS) 800-160 MG tablet Take 1 tablet (100 mg) by mouth 2 (two) times daily. 20 tablet 0  . vitamin B-12 (CYANOCOBALAMIN) 100 MCG tablet Take 50 mcg by mouth daily.     . vitamin E 100 UNIT capsule Take 100 Units by mouth daily.      Marland Kitchen azithromycin (ZITHROMAX) 250 MG tablet 2 tabs poqday1, 1 tab poqday 2-5 (Patient not taking: Reported on 09/15/2016) 6 tablet 0  . azithromycin (ZITHROMAX) 250 MG tablet Day 1: Take 2 daily.  Days -5: Take 1 daily. (Patient not taking: Reported on 07/18/2016) 6 tablet 0  . predniSONE (DELTASONE) 20 MG tablet Take 1 tablet (20 mg total) by mouth daily with breakfast. (Patient not taking: Reported on 07/18/2016) 5 tablet 0   No facility-administered medications prior to visit.     Allergies:  Allergies  Allergen Reactions  . Penicillins Hives    Social History   Social History  . Marital status: Divorced  Spouse name: N/A  . Number of children: N/A  . Years of education: N/A   Occupational History  . Not on file.   Social History Main Topics  . Smoking status: Never Smoker  . Smokeless tobacco: Never Used     Comment: never used tobacco  . Alcohol use No  . Drug use: No  . Sexual activity: Not on file   Other Topics Concern  . Not on file   Social History Narrative  . No narrative on file    No family history on file.   Review of Systems:  See HPI for pertinent ROS. All other ROS negative.    Physical Exam: Blood pressure 128/74, pulse 68, temperature 97.8 F (36.6 C), temperature source Oral, resp. rate 16, weight 233 lb 6.4 oz (105.9 kg), SpO2 98 %., Body mass index is 40.06 kg/m. General:  Obese WF. Appears in no acute distress. Head: Normocephalic, atraumatic, eyes without discharge, sclera non-icteric, nares are without discharge. Bilateral auditory canals clear, TM's are without perforation, pearly grey and translucent with reflective cone of light bilaterally. Oral cavity moist, posterior pharynx without exudate, erythema, peritonsillar abscess. There is tenderness with percussion of bilateral maxillary sinuses.  Neck: Supple. No thyromegaly. No lymphadenopathy. Lungs: Very slight wheeze scattered--o/w clear Heart: RRR with S1 S2. No murmurs, rubs, or gallops. Musculoskeletal:  Strength and tone normal for age. Extremities/Skin: Left 3rd toe--- Proximal edge of nail and along proximal sides of nail--there is erythema and puffiness. That entire toenail is thick c/w fungus Neuro: Alert and oriented X 3. Moves all extremities spontaneously. Gait is normal. CNII-XII grossly in tact. Psych:  Responds to questions appropriately with a normal affect.     ASSESSMENT AND PLAN:  76 y.o. year old female with  1. Anxiety She is to do trial use of this at home to see how it affects her prior to trip. If one is ineffective, can take 2nd dose. - ALPRAZolam (XANAX) 0.25 MG tablet; Take 1 tablet (0.25 mg total) by mouth daily as needed for anxiety.  Dispense: 20 tablet; Refill: 0  2. Candidiasis This will be covered by treatment on #4 below.  3. Acute maxillary sinusitis, recurrence not specified She is to take prednisone taper. F/U if develops htick dark mucus or phlegm or fever or if symptoms do not resolve. - predniSONE (DELTASONE) 20 MG tablet; Take 3 daily for 2 days, then 2 daily for 2 days, then 1 daily for 2 days.  Dispense: 12 tablet; Refill: 0  4. Paronychia, toe, left She is to take Diflucan a t pulsed weekly dose. Apply topical lotrisone. F/U if worsens, does not resolve. - fluconazole (DIFLUCAN) 150 MG tablet; Take one once weekly for 5 weeks.  Dispense: 5 tablet; Refill:  0 - clotrimazole-betamethasone (LOTRISONE) cream; Apply 1 application topically 2 (two) times daily.  Dispense: 30 g; Refill: 0   Signed, [address] [place], [place], [facility] 09/16/2016 8:10 AM

## 2016-09-22 ENCOUNTER — Telehealth: Payer: Self-pay

## 2016-09-22 NOTE — Telephone Encounter (Signed)
Pt states she has taken the last of the prednisone and is req a refill pt states she is still stopped up

## 2016-09-23 MED ORDER — CETIRIZINE HCL 10 MG PO TABS: 10.0000 mg | ORAL_TABLET | Freq: Every day | ORAL | 11 refills | Status: DC

## 2016-09-23 MED ORDER — FLUTICASONE PROPIONATE 50 MCG/ACT NA SUSP: NASAL | 5 refills | Status: DC

## 2016-09-23 NOTE — Telephone Encounter (Signed)
I called pt to clarify current symptoms---says she is feeling drainage in her throat. Feeling stopped up in left maxillary sinus. Not blowing much out of nose but what comes out is clear. What she coughs up from draiange in throat is clear.  Told her to add the following--please send in Rx for following: Zyrtec 10mg  1 po QD Flonase 2 sprays per nostril daily # 1 + 5 refills

## 2016-09-23 NOTE — Telephone Encounter (Signed)
Rx filled

## 2016-10-09 ENCOUNTER — Other Ambulatory Visit: Payer: Self-pay | Admitting: Family Medicine

## 2016-10-09 DIAGNOSIS — J209 Acute bronchitis, unspecified: Secondary | ICD-10-CM

## 2016-10-26 ENCOUNTER — Ambulatory Visit (INDEPENDENT_AMBULATORY_CARE_PROVIDER_SITE_OTHER): Payer: PPO | Admitting: Family Medicine

## 2016-10-26 ENCOUNTER — Encounter: Payer: Self-pay | Admitting: Family Medicine

## 2016-10-26 VITALS — BP 128/78 | HR 66 | Temp 97.9°F | Resp 18 | Ht 64.0 in | Wt 230.0 lb

## 2016-10-26 DIAGNOSIS — J0100 Acute maxillary sinusitis, unspecified: Secondary | ICD-10-CM

## 2016-10-26 DIAGNOSIS — J209 Acute bronchitis, unspecified: Secondary | ICD-10-CM

## 2016-10-26 MED ORDER — PREDNISONE 20 MG PO TABS
40.0000 mg | ORAL_TABLET | Freq: Every day | ORAL | 0 refills | Status: DC
Start: 2016-10-26 — End: 2016-11-01

## 2016-10-26 MED ORDER — AZITHROMYCIN 250 MG PO TABS: ORAL_TABLET | ORAL | 0 refills | Status: DC

## 2016-10-26 MED ORDER — HYDROCODONE-ACETAMINOPHEN 5-325 MG PO TABS: 1.0000 | ORAL_TABLET | Freq: Two times a day (BID) | ORAL | 0 refills | Status: DC | PRN

## 2016-10-26 NOTE — Progress Notes (Signed)
   Subjective:    Patient ID: [name], female    DOB: 07-03-1941, 76 y.o.   MRN: [id number]  Patient presents for Illness (x1 week- chest congestion, productive cough with yellow to green colored sputum, SOB on exertion)   Started due to cough and congestion/wheezing last Thursday. Had subjective fever, last night and chills. Used albuterol every 6 hours which helped some.  Also taking zyrtec and flonase  No known sick contacts Non smoker   Request refill on pain medications    Review Of Systems:  GEN- denies fatigue,+ fever, weight loss,weakness, recent illness HEENT- denies eye drainage, change in vision,+ nasal discharge, CVS- denies chest pain, palpitations RESP- denies SOB, +cough,+ wheeze ABD- denies N/V, change in stools, abd pain GU- denies dysuria, hematuria, dribbling, incontinence MSK- denies joint pain, muscle aches, injury Neuro- denies headache, dizziness, syncope, seizure activity       Objective:    BP 128/78   Pulse 66   Temp 97.9 F (36.6 C) (Oral)   Resp 18   Ht 5\' 4"  (1.626 m)   Wt 230 lb (104.3 kg)   SpO2 98%   BMI 39.48 kg/m  GEN- NAD, alert and oriented x3 HEENT- PERRL, EOMI, non injected sclera, pink conjunctiva, MMM, oropharynx clear, +left maxiilary sinus tenderness, nares +rhinorrhea  Neck- Supple, no LAD  CVS- RRR, no murmur RESP-some upper airway congestion, no wheeze, no retractions, good air movement  Pulses- Radial 2+        Assessment & Plan:      Problem List Items Addressed This Visit    None    Visit Diagnoses    Acute bronchitis, unspecified organism    -  Primary   Treat for bronchitis, with albuterol prednisone x 5 days. Given zpak cover early sinusitis which pt has had in past and bronchitis , hiigher risk of developing PNA Normal oxygen sat   Continue flonase, zyrtec   Acute maxillary sinusitis, recurrence not specified       Relevant Medications   azithromycin (ZITHROMAX) 250 MG tablet   predniSONE  (DELTASONE) 20 MG tablet      Note: This dictation was prepared with Dragon dictation along with smaller phrase technology. Any transcriptional errors that result from this process are unintentional.

## 2016-10-26 NOTE — Patient Instructions (Signed)
Take the prednisone Take antibiotics  Pain medication refilled F/U 4 months [facility] for Wellness Exam

## 2016-11-01 ENCOUNTER — Ambulatory Visit
Admission: RE | Admit: 2016-11-01 | Discharge: 2016-11-01 | Disposition: A | Discharge status: Home / Self Care | Payer: PPO | Source: Ambulatory Visit | Attending: Family Medicine | Admitting: Family Medicine

## 2016-11-01 ENCOUNTER — Encounter: Payer: Self-pay | Admitting: Family Medicine

## 2016-11-01 ENCOUNTER — Ambulatory Visit (INDEPENDENT_AMBULATORY_CARE_PROVIDER_SITE_OTHER): Payer: PPO | Admitting: Family Medicine

## 2016-11-01 VITALS — BP 130/78 | HR 80 | Temp 98.0°F | Resp 18 | Ht 64.0 in | Wt 233.0 lb

## 2016-11-01 DIAGNOSIS — J209 Acute bronchitis, unspecified: Secondary | ICD-10-CM

## 2016-11-01 DIAGNOSIS — R05 Cough: Secondary | ICD-10-CM | POA: Diagnosis not present

## 2016-11-01 DIAGNOSIS — J0100 Acute maxillary sinusitis, unspecified: Secondary | ICD-10-CM

## 2016-11-01 MED ORDER — ALBUTEROL SULFATE (2.5 MG/3ML) 0.083% IN NEBU: 2.5000 mg | INHALATION_SOLUTION | Freq: Four times a day (QID) | RESPIRATORY_TRACT | 1 refills | Status: DC | PRN

## 2016-11-01 MED ORDER — ALBUTEROL SULFATE (2.5 MG/3ML) 0.083% IN NEBU
2.5000 mg | INHALATION_SOLUTION | Freq: Once | RESPIRATORY_TRACT | Status: AC
  Administered 2016-11-01: 2.5 mg via RESPIRATORY_TRACT

## 2016-11-01 MED ORDER — METHYLPREDNISOLONE ACETATE 40 MG/ML IJ SUSP
40.0000 mg | Freq: Once | INTRAMUSCULAR | Status: AC
  Administered 2016-11-01: 40 mg via INTRAMUSCULAR

## 2016-11-01 MED ORDER — PREDNISONE 10 MG PO TABS: ORAL_TABLET | ORAL | 0 refills | Status: DC

## 2016-11-01 NOTE — Patient Instructions (Signed)
Take Prednisone as prescribed Use albuterol nebulizer as needed Get the chest xray   F/U as needed

## 2016-11-01 NOTE — Progress Notes (Signed)
   Subjective:    Patient ID: [name], female    DOB: 1941/02/13, 76 y.o.   MRN: [id number]  Patient presents for Illness (states that chest congestion has not improved)  Pt here with ongoing chest congestion, wheezing. Treated for bronchitis and sinutis, given zpak, prednisone for 5 days. Chest still feels tight. Using albuterol inhaler as well. Taking robitussin as prescribed  no fever, no chest pain     Review Of Systems:  GEN- denies fatigue, fever, weight loss,weakness, recent illness HEENT- denies eye drainage, change in vision,+ nasal discharge, CVS- denies chest pain, palpitations RESP- denies SOB, +cough+, wheeze ABD- denies N/V, change in stools, abd pain GU- denies dysuria, hematuria, dribbling, incontinence MSK- denies joint pain, muscle aches, injury Neuro- denies headache, dizziness, syncope, seizure activity       Objective:    BP 130/78   Pulse 80   Temp 98 F (36.7 C) (Oral)   Resp 18   Ht 5\' 4"  (1.626 m)   Wt 233 lb (105.7 kg)   SpO2 98%   BMI 39.99 kg/m  GEN- NAD, alert and oriented x3 HEENT- PERRL, EOMI, non injected sclera, pink conjunctiva, MMM, oropharynx clear, left maxillary sinus tenderness, clear rhinorrhea,, TM clear bilat no effusion  Neck- Supple, no LAD  CVS- RRR, no murmur RESP-congestion bilat, no wheeze, fair air movement, s/p neb- good air movement, pt appears more comfortable breathing Ext- no edema          Assessment & Plan:      Problem List Items Addressed This Visit    None    Visit Diagnoses    Acute bronchitis, unspecified organism    -  Primary   Given Depo Medrol in office, Albuterol Neb, responded well, seemed more comfortable, CXR obtain no PNA but has hiatal hernia. GIven longer prednisone taper, no further antibiotics as no PNA Continue nasal saline, allergy meds    Relevant Medications   methylPREDNISolone acetate (DEPO-MEDROL) injection 40 mg (Completed)   albuterol (PROVENTIL) (2.5 MG/3ML) 0.083%  nebulizer solution 2.5 mg (Completed)   Other Relevant Orders   DG Chest 2 View (Completed)   Acute maxillary sinusitis, recurrence not specified       Relevant Medications   methylPREDNISolone acetate (DEPO-MEDROL) injection 40 mg (Completed)   albuterol (PROVENTIL) (2.5 MG/3ML) 0.083% nebulizer solution 2.5 mg (Completed)   predniSONE (DELTASONE) 10 MG tablet      Note: This dictation was prepared with Dragon dictation along with smaller phrase technology. Any transcriptional errors that result from this process are unintentional.

## 2016-11-03 ENCOUNTER — Telehealth: Payer: Self-pay | Admitting: Family Medicine

## 2016-11-03 DIAGNOSIS — K449 Diaphragmatic hernia without obstruction or gangrene: Principal | ICD-10-CM

## 2016-11-03 DIAGNOSIS — K219 Gastro-esophageal reflux disease without esophagitis: Secondary | ICD-10-CM

## 2016-11-03 NOTE — Telephone Encounter (Signed)
-----   Message from Alycia Rossetti, MD sent at 11/01/2016  3:38 PM EDT ----- Call pt CXR no PNA she has large hiatal hernia, not sure if she was aware of this If she gets reflux, recommend being seen by GI

## 2016-11-03 NOTE — Telephone Encounter (Signed)
Pt told about CXR report.  Still with cough and coughing out greenish secretions.  Not sure if from chest or sinuses.  Is using Neb Tx and that is easing her breathing but still feels bad.  Does she need more antibiotics?

## 2016-11-04 MED ORDER — LEVOFLOXACIN 500 MG PO TABS: 500.0000 mg | ORAL_TABLET | Freq: Every day | ORAL | 0 refills | Status: DC

## 2016-11-04 NOTE — Telephone Encounter (Signed)
Rx to pharmacy.  Tried to call pt no answer, no voice mail

## 2016-11-04 NOTE — Telephone Encounter (Signed)
Saw Dr. Keturah Barre twice.  Was given a zpack first and depomedrol on 4/3.  Could try levaquin 500 poqday for 7 days.

## 2016-11-08 NOTE — Telephone Encounter (Signed)
Finaaly able to reach pt and made her aware of antibiotic sent to pharmacy.  Says does want to see a GI doctor.  Asked to see Dr. Collene Mares

## 2016-11-17 DIAGNOSIS — C50911 Malignant neoplasm of unspecified site of right female breast: Secondary | ICD-10-CM | POA: Diagnosis not present

## 2016-11-17 DIAGNOSIS — C50912 Malignant neoplasm of unspecified site of left female breast: Secondary | ICD-10-CM | POA: Diagnosis not present

## 2016-11-22 ENCOUNTER — Ambulatory Visit (INDEPENDENT_AMBULATORY_CARE_PROVIDER_SITE_OTHER): Payer: PPO

## 2016-11-22 ENCOUNTER — Ambulatory Visit (INDEPENDENT_AMBULATORY_CARE_PROVIDER_SITE_OTHER): Payer: PPO | Admitting: Podiatry

## 2016-11-22 DIAGNOSIS — G5790 Unspecified mononeuropathy of unspecified lower limb: Secondary | ICD-10-CM

## 2016-11-22 DIAGNOSIS — M79674 Pain in right toe(s): Secondary | ICD-10-CM | POA: Diagnosis not present

## 2016-11-22 DIAGNOSIS — M2012 Hallux valgus (acquired), left foot: Secondary | ICD-10-CM

## 2016-11-22 DIAGNOSIS — M2042 Other hammer toe(s) (acquired), left foot: Secondary | ICD-10-CM

## 2016-11-22 DIAGNOSIS — M79675 Pain in left toe(s): Secondary | ICD-10-CM

## 2016-11-22 DIAGNOSIS — B351 Tinea unguium: Secondary | ICD-10-CM | POA: Diagnosis not present

## 2016-11-22 MED ORDER — GABAPENTIN 100 MG PO CAPS: 100.0000 mg | ORAL_CAPSULE | Freq: Every day | ORAL | 3 refills | Status: DC

## 2016-11-22 NOTE — Progress Notes (Signed)
   Subjective:    Patient ID: [name], female    DOB: 1941-07-18, 76 y.o.   MRN: [id number]  HPI: She presents today with chief complaint of a hammertoe second left and a bunion left. She is also concerned about painful toenail to the third digit left foot being thick and dystrophic.    Review of Systems  Musculoskeletal: Positive for arthralgias.  All other systems reviewed and are negative.      Objective:   Physical Exam: Vital signs are stable alert and oriented 3. Pulses are palpable. Neurologic sensorium is intact. Degenerative flexor intact. Muscle strength is normal bilateral. Orthopedic evaluation demonstrates bulbous distal angle full range of motion without crepitation right foot left foot demonstrates hammertoe deformities left second greater than that of the right second. Severe hallux valgus deformity very prominent first metatarsophalangeal. Radiographs do demonstrate osseously mature individual with osteopenia. Severe increase in the first metatarsal angle greater than normal value and dislocation of the first metatarsophalangeal joint. She also has dislocation of the metatarsophalangeal joint at the level of the second toe. No open lesions or wounds are noted. She does have thick yellow dystrophic with mycotic nails.    Assessment & Plan:  Assessment: Severe hallux valgus deformity hammertoe deformity second left. Discussed surgical intervention with her today in great detail today toenails along the joint surgically mycotic debrided those for her bilateral.

## 2016-12-06 DIAGNOSIS — C50911 Malignant neoplasm of unspecified site of right female breast: Secondary | ICD-10-CM | POA: Diagnosis not present

## 2016-12-06 DIAGNOSIS — C50912 Malignant neoplasm of unspecified site of left female breast: Secondary | ICD-10-CM | POA: Diagnosis not present

## 2016-12-23 ENCOUNTER — Other Ambulatory Visit (HOSPITAL_BASED_OUTPATIENT_CLINIC_OR_DEPARTMENT_OTHER): Payer: PPO

## 2016-12-23 ENCOUNTER — Ambulatory Visit (HOSPITAL_BASED_OUTPATIENT_CLINIC_OR_DEPARTMENT_OTHER): Payer: PPO | Admitting: Hematology & Oncology

## 2016-12-23 VITALS — BP 141/71 | HR 59 | Temp 97.8°F | Resp 18 | Wt 226.8 lb

## 2016-12-23 DIAGNOSIS — C50911 Malignant neoplasm of unspecified site of right female breast: Secondary | ICD-10-CM

## 2016-12-23 DIAGNOSIS — C50012 Malignant neoplasm of nipple and areola, left female breast: Secondary | ICD-10-CM

## 2016-12-23 DIAGNOSIS — Z9221 Personal history of antineoplastic chemotherapy: Secondary | ICD-10-CM

## 2016-12-23 DIAGNOSIS — C50912 Malignant neoplasm of unspecified site of left female breast: Secondary | ICD-10-CM

## 2016-12-23 DIAGNOSIS — Z853 Personal history of malignant neoplasm of breast: Secondary | ICD-10-CM | POA: Diagnosis not present

## 2016-12-23 DIAGNOSIS — C779 Secondary and unspecified malignant neoplasm of lymph node, unspecified: Principal | ICD-10-CM

## 2016-12-23 LAB — CBC WITH DIFFERENTIAL (CANCER CENTER ONLY)
BASO#: 0 10*3/uL (ref 0.0–0.2)
BASO%: 0.3 % (ref 0.0–2.0)
EOS%: 4 % (ref 0.0–7.0)
Eosinophils Absolute: 0.3 10*3/uL (ref 0.0–0.5)
HEMATOCRIT: 46.4 % (ref 34.8–46.6)
HGB: 15.5 g/dL (ref 11.6–15.9)
LYMPH#: 1.9 10*3/uL (ref 0.9–3.3)
LYMPH%: 25.7 % (ref 14.0–48.0)
MCH: 32.8 pg (ref 26.0–34.0)
MCHC: 33.4 g/dL (ref 32.0–36.0)
MCV: 98 fL (ref 81–101)
MONO#: 0.6 10*3/uL (ref 0.1–0.9)
MONO%: 7.6 % (ref 0.0–13.0)
NEUT#: 4.5 10*3/uL (ref 1.5–6.5)
NEUT%: 62.4 % (ref 39.6–80.0)
PLATELETS: 221 10*3/uL (ref 145–400)
RBC: 4.72 10*6/uL (ref 3.70–5.32)
RDW: 12.7 % (ref 11.1–15.7)
WBC: 7.2 10*3/uL (ref 3.9–10.0)

## 2016-12-23 LAB — COMPREHENSIVE METABOLIC PANEL
ALT: 15 U/L (ref 0–55)
AST: 24 U/L (ref 5–34)
Albumin: 3.7 g/dL (ref 3.5–5.0)
Alkaline Phosphatase: 69 U/L (ref 40–150)
Anion Gap: 7 mEq/L (ref 3–11)
BUN: 8.2 mg/dL (ref 7.0–26.0)
CHLORIDE: 103 meq/L (ref 98–109)
CO2: 29 mEq/L (ref 22–29)
Calcium: 9.5 mg/dL (ref 8.4–10.4)
Creatinine: 0.7 mg/dL (ref 0.6–1.1)
EGFR: 83 mL/min/{1.73_m2} — AB (ref >90)
Glucose: 98 mg/dl (ref 70–140)
POTASSIUM: 4.2 meq/L (ref 3.5–5.1)
Sodium: 139 mEq/L (ref 136–145)
Total Bilirubin: 0.94 mg/dL (ref 0.20–1.20)
Total Protein: 6.6 g/dL (ref 6.4–8.3)

## 2016-12-23 LAB — LACTATE DEHYDROGENASE: LDH: 199 U/L (ref 125–245)

## 2016-12-23 NOTE — Progress Notes (Signed)
Hematology and Oncology Follow Up Visit  [name] [id number] 1940/08/03 76 y.o. 12/23/2016   Principle Diagnosis:  Stage IIB (T2 N1 M0) ductal carcinoma of the right breast.  Current Therapy:    Observation     Interim History:  Ms.  Figueroa is comes in for followup. We last saw her a year ago. She's doing well. 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 She had a good Mother's Day. She was down at the beach. She actually received an apple iMac for Mother's Day. She was very excited to have this.  She is now expecting her eighth great grandchild in November. She is quite happy about this. This will be a boy.  She's had no problems with nausea  or vomiting.   She has had no rashes. She's had no leg swelling. She has occasional swelling of the left arm.  There's been no change in bowel or bladder habits.   She's had no fevers. She had no infection over wintertime.  Overall, her performance status is ECOG 1.    Medications:  Current Outpatient Prescriptions:  .  albuterol (PROAIR HFA) 108 (90 Base) MCG/ACT inhaler, Inhale 2 puffs into the lungs every 6 (six) hours as needed., Disp: 1 Inhaler, Rfl: 3 .  albuterol (PROVENTIL) (2.5 MG/3ML) 0.083% nebulizer solution, Take 3 mLs (2.5 mg total) by nebulization every 6 (six) hours as needed for wheezing or shortness of breath., Disp: 75 mL, Rfl: 1 .  ALPRAZolam (XANAX) 0.25 MG tablet, Take 1 tablet (0.25 mg total) by mouth daily as needed for anxiety., Disp: 20 tablet, Rfl: 0 .  cetirizine (ZYRTEC) 10 MG tablet, Take 1 tablet (10 mg total) by mouth daily., Disp: 30 tablet, Rfl: 11 .  Cholecalciferol (VITAMIN D) 2000 UNITS tablet, Take 2,000 Units by mouth daily. 09-25-12 pt was instructed to increase from 1000 to 2000 units daily, Disp: , Rfl:  .  clotrimazole-betamethasone (LOTRISONE) cream, Apply 1 application topically 2 (two) times daily., Disp: 30 g, Rfl: 0 .  diclofenac sodium (VOLTAREN) 1 % GEL,  Apply 4 g topically 4 (four) times daily., Disp: 100 g, Rfl: 1 .  escitalopram (LEXAPRO) 10 MG tablet, TAKE ONE TABLET BY MOUTH ONCE DAILY, Disp: 30 tablet, Rfl: 2 .  fluticasone (FLONASE) 50 MCG/ACT nasal spray, 2 sprays per nostril daily.please dispense 15.31ml, Disp: 16 g, Rfl: 5 .  gabapentin (NEURONTIN) 100 MG capsule, Take 1 capsule (100 mg total) by mouth at bedtime., Disp: 30 capsule, Rfl: 3 .  HYDROcodone-acetaminophen (NORCO/VICODIN) 5-325 MG tablet, Take 1 tablet by mouth 2 (two) times daily as needed., Disp: 60 tablet, Rfl: 0 .  omeprazole (PRILOSEC) 20 MG capsule, TAKE ONE CAPSULE BY MOUTH TWICE DAILY, Disp: 60 capsule, Rfl: 11 .  predniSONE (DELTASONE) 10 MG tablet, Take 40mg  x 3 days,20mg  x 3 days, 10mg  x 3 days, Disp: 21 tablet, Rfl: 0  Allergies:  Allergies  Allergen Reactions  . Penicillins Hives    Past Medical History, Surgical history, Social history, and Family History were reviewed and updated.  Review of Systems: As above  Physical Exam:  weight is 226 lb 12.8 oz (102.9 kg). Her oral temperature is 97.8 F (36.6 C). Her blood pressure is 141/71 (abnormal) and her pulse is 59 (abnormal). Her respiration is 18 and oxygen saturation is 93%.   Somewhat obese white female. Her head and neck exam shows no ocular or oral lesions. There are no palpable cervical or supraclavicular lymph nodes.  Her lungs are clear. Cardiac exam regular rate and rhythm with no murmurs, rubs or bruits.. Chest wall exam shows bilateral mastectomies. No bilateral axillary adenopathy is noted. Abdomen soft. Mildly obese. There is no palpable liver or spleen tip. Back exam no tenderness over the spine ribs or hips. Extremities shows no clubbing cyanosis or edema. She has some chronic mild non-pitting edema in the lower legs. Skin exam no rashes.  Lab Results  Component Value Date   WBC 7.2 12/23/2016   HGB 15.5 12/23/2016   HCT 46.4 12/23/2016   MCV 98 12/23/2016   PLT 221 12/23/2016      Chemistry      Component Value Date/Time   NA 141 07/18/2016 1631   NA 143 12/24/2015 1100   K 3.9 07/18/2016 1631   K 4.4 12/24/2015 1100   CL 105 07/18/2016 1631   CO2 29 07/18/2016 1631   CO2 30 (H) 12/24/2015 1100   BUN 10 07/18/2016 1631   BUN 10.6 12/24/2015 1100   CREATININE 0.63 07/18/2016 1631   CREATININE 0.7 12/24/2015 1100      Component Value Date/Time   CALCIUM 8.4 (L) 07/18/2016 1631   CALCIUM 9.4 12/24/2015 1100   ALKPHOS 44 07/18/2016 1631   ALKPHOS 53 12/24/2015 1100   AST 22 07/18/2016 1631   AST 18 12/24/2015 1100   ALT 11 07/18/2016 1631   ALT 12 12/24/2015 1100   BILITOT 0.6 07/18/2016 1631   BILITOT 0.97 12/24/2015 1100         Impression and Plan: [name] is 76 year old white female with a history of stage IIB infiltrating ductal carcinoma the right breast. She did receive some adjuvant chemotherapy. She had chemotherapy that was completed back in June of 2004.  I do not see any evidence of recurrent disease. She now is 14 years out from therapy.  She likes to come back to see [place]. She just feels much more confident in our examining her.  We will go ahead and her back in another year.     Volanda Napoleon, MD 5/25/201811:19 AM physical labor that was

## 2016-12-27 ENCOUNTER — Other Ambulatory Visit: Payer: Self-pay | Admitting: Family Medicine

## 2016-12-27 DIAGNOSIS — J209 Acute bronchitis, unspecified: Secondary | ICD-10-CM

## 2016-12-30 ENCOUNTER — Encounter: Payer: Self-pay | Admitting: Family Medicine

## 2016-12-30 ENCOUNTER — Ambulatory Visit (INDEPENDENT_AMBULATORY_CARE_PROVIDER_SITE_OTHER): Payer: PPO | Admitting: Family Medicine

## 2016-12-30 VITALS — BP 134/80 | HR 78 | Temp 98.0°F | Resp 20 | Ht 64.0 in | Wt 229.0 lb

## 2016-12-30 DIAGNOSIS — M159 Polyosteoarthritis, unspecified: Secondary | ICD-10-CM

## 2016-12-30 DIAGNOSIS — M150 Primary generalized (osteo)arthritis: Secondary | ICD-10-CM

## 2016-12-30 DIAGNOSIS — M7541 Impingement syndrome of right shoulder: Secondary | ICD-10-CM

## 2016-12-30 NOTE — Progress Notes (Signed)
Subjective:    Patient ID: [name], female    DOB: September 27, 1940, 76 y.o.   MRN: [id number]  HPI I see the patient very infrequently. She uses hydrocodone sparingly for osteoarthritic pain in multiple joints primarily in the joints of her hands including the DIP and PIP joints bilaterally as well as her knees and shoulders and lower back. She has a history of hiatal hernia and acid reflux and does not tolerate oral NSAIDs. She uses voltaren gel on her  hands and this seems to help. She has marketed and severe osteoarthritic deformities in the DIP and PIP joints on both hands. She uses the hydrocodone to help her sleep at night. She seldom takes this on a daily basis and uses it sparingly. She also presents with 2 months of pain in her right shoulder. It hurts to abduct her arm greater than 90. She reports pain with internal and external rotation. She has positive empty can sign and a positive Hawkins sign. She is requesting a cortisone injection into the subacromial space. She saw significant benefit we performed this last year on her left shoulder Past Medical History:  Diagnosis Date  . Anemia   . Arthritis   . Breast cancer ([place])   . Depression   . Diverticulosis   . Osteoporosis   . Vitamin D deficiency    Past Surgical History:  Procedure Laterality Date  . ABDOMINAL HYSTERECTOMY    . APPENDECTOMY    . CHOLECYSTECTOMY    . MASTECTOMY    . TONSILLECTOMY     Current Outpatient Prescriptions on File Prior to Visit  Medication Sig Dispense Refill  . albuterol (PROAIR HFA) 108 (90 Base) MCG/ACT inhaler Inhale 2 puffs into the lungs every 6 (six) hours as needed. 1 Inhaler 3  . albuterol (PROVENTIL) (2.5 MG/3ML) 0.083% nebulizer solution Take 3 mLs (2.5 mg total) by nebulization every 6 (six) hours as needed for wheezing or shortness of breath. 75 mL 1  . ALPRAZolam (XANAX) 0.25 MG tablet Take 1 tablet (0.25 mg total) by mouth daily as needed for anxiety. 20 tablet 0  .  cetirizine (ZYRTEC) 10 MG tablet Take 1 tablet (10 mg total) by mouth daily. 30 tablet 11  . Cholecalciferol (VITAMIN D) 2000 UNITS tablet Take 2,000 Units by mouth daily. 09-25-12 pt was instructed to increase from 1000 to 2000 units daily    . diclofenac sodium (VOLTAREN) 1 % GEL Apply 4 g topically 4 (four) times daily. 100 g 1  . escitalopram (LEXAPRO) 10 MG tablet TAKE ONE TABLET BY MOUTH ONCE DAILY 30 tablet 2  . fluticasone (FLONASE) 50 MCG/ACT nasal spray 2 sprays per nostril daily.please dispense 15.59ml 16 g 5  . gabapentin (NEURONTIN) 100 MG capsule Take 1 capsule (100 mg total) by mouth at bedtime. 30 capsule 3  . HYDROcodone-acetaminophen (NORCO/VICODIN) 5-325 MG tablet Take 1 tablet by mouth 2 (two) times daily as needed. 60 tablet 0  . omeprazole (PRILOSEC) 20 MG capsule TAKE ONE CAPSULE BY MOUTH TWICE DAILY 60 capsule 11   No current facility-administered medications on file prior to visit.    Allergies  Allergen Reactions  . Penicillins Hives   Social History   Social History  . Marital status: Divorced    Spouse name: N/A  . Number of children: N/A  . Years of education: N/A   Occupational History  . Not on file.   Social History Main Topics  . Smoking status: Never Smoker  . Smokeless tobacco:  Never Used     Comment: never used tobacco  . Alcohol use No  . Drug use: No  . Sexual activity: Not Currently   Other Topics Concern  . Not on file   Social History Narrative  . No narrative on file      Review of Systems  All other systems reviewed and are negative.      Objective:   Physical Exam  Constitutional: She appears well-developed and well-nourished.  Cardiovascular: Normal rate, regular rhythm and normal heart sounds.   No murmur heard. Pulmonary/Chest: Effort normal and breath sounds normal. No respiratory distress. She has no wheezes. She has no rales.  Abdominal: Soft. Bowel sounds are normal. She exhibits no distension. There is no  tenderness. There is no rebound.  Musculoskeletal:       Right shoulder: She exhibits decreased range of motion, tenderness, pain and decreased strength.       Right hand: She exhibits decreased range of motion, bony tenderness and deformity.       Left hand: She exhibits decreased range of motion, bony tenderness and deformity.          Assessment & Plan:  Primary osteoarthritis involving multiple joints - Plan: CBC with Differential/Platelet, COMPLETE METABOLIC PANEL WITH GFR  Impingement syndrome of right shoulder  At the present time, the patient is using hydrocodone sparingly for the pain in her multiple joints primarily in her hands feet lower back and knees. I see no reason why she cannot continue to use the hydrocodone once a day at night to help her sleep. There is no evidence of abuse or diversion. Therefore I will gladly refill her medication. I believe she has impingement syndrome of the right shoulder possibly bursitis.  Using sterile technique, I injected the right shoulder with mixture of 2 mL of lidocaine, 2 mL of Marcaine, and 2 mL of 40 mg per mL Kenalog. The patient tolerated the procedure well without complication

## 2016-12-31 LAB — COMPLETE METABOLIC PANEL WITH GFR
ALK PHOS: 61 U/L (ref 33–130)
ALT: 12 U/L (ref 6–29)
AST: 17 U/L (ref 10–35)
Albumin: 3.8 g/dL (ref 3.6–5.1)
BILIRUBIN TOTAL: 0.6 mg/dL (ref 0.2–1.2)
BUN: 10 mg/dL (ref 7–25)
CO2: 30 mmol/L (ref 20–31)
CREATININE: 0.73 mg/dL (ref 0.60–0.93)
Calcium: 9.3 mg/dL (ref 8.6–10.4)
Chloride: 102 mmol/L (ref 98–110)
GFR, Est African American: >89 mL/min (ref >=60)
GFR, Est Non African American: 79 mL/min (ref >=60)
Glucose, Bld: 82 mg/dL (ref 70–99)
Potassium: 3.7 mmol/L (ref 3.5–5.3)
Sodium: 141 mmol/L (ref 135–146)
TOTAL PROTEIN: 6.1 g/dL (ref 6.1–8.1)

## 2016-12-31 LAB — CBC WITH DIFFERENTIAL/PLATELET
BASOS ABS: 0 {cells}/uL (ref 0–200)
Basophils Relative: 0 %
Eosinophils Absolute: 249 cells/uL (ref 15–500)
Eosinophils Relative: 3 %
HCT: 44.8 % (ref 35.0–45.0)
Hemoglobin: 14.8 g/dL (ref 12.0–15.0)
Lymphocytes Relative: 27 %
Lymphs Abs: 2241 cells/uL (ref 850–3900)
MCH: 32.3 pg (ref 27.0–33.0)
MCHC: 33 g/dL (ref 32.0–36.0)
MCV: 97.8 fL (ref 80.0–100.0)
MPV: 9.5 fL (ref 7.5–12.5)
Monocytes Absolute: 664 cells/uL (ref 200–950)
Monocytes Relative: 8 %
NEUTROS ABS: 5146 {cells}/uL (ref 1500–7800)
Neutrophils Relative %: 62 %
PLATELETS: 258 10*3/uL (ref 140–400)
RBC: 4.58 MIL/uL (ref 3.80–5.10)
RDW: 13.1 % (ref 11.0–15.0)
WBC: 8.3 10*3/uL (ref 3.8–10.8)

## 2017-01-03 ENCOUNTER — Telehealth: Payer: Self-pay | Admitting: Family Medicine

## 2017-01-03 MED ORDER — HYDROCODONE-ACETAMINOPHEN 5-325 MG PO TABS: 1.0000 | ORAL_TABLET | Freq: Two times a day (BID) | ORAL | 0 refills | Status: DC | PRN

## 2017-01-03 NOTE — Telephone Encounter (Signed)
Refill done.  

## 2017-01-03 NOTE — Telephone Encounter (Signed)
Patient is requesting a refill on Hydrocodone - Ok to refill??       LRF - 10/26/16 LOV 12/30/16  (pt will p/u Friday)

## 2017-01-03 NOTE — Telephone Encounter (Signed)
RX printed, left up front and patient aware to pick up  

## 2017-01-09 ENCOUNTER — Other Ambulatory Visit: Payer: Self-pay | Admitting: Family Medicine

## 2017-01-13 ENCOUNTER — Other Ambulatory Visit: Payer: Self-pay | Admitting: Hematology & Oncology

## 2017-01-19 ENCOUNTER — Ambulatory Visit: Payer: PPO | Admitting: Podiatry

## 2017-02-02 ENCOUNTER — Encounter: Payer: Self-pay | Admitting: Podiatry

## 2017-02-02 ENCOUNTER — Ambulatory Visit (INDEPENDENT_AMBULATORY_CARE_PROVIDER_SITE_OTHER): Payer: PPO | Admitting: Podiatry

## 2017-02-02 DIAGNOSIS — M79675 Pain in left toe(s): Secondary | ICD-10-CM | POA: Diagnosis not present

## 2017-02-02 DIAGNOSIS — B351 Tinea unguium: Secondary | ICD-10-CM | POA: Diagnosis not present

## 2017-02-02 DIAGNOSIS — M79674 Pain in right toe(s): Secondary | ICD-10-CM | POA: Diagnosis not present

## 2017-02-02 DIAGNOSIS — G5790 Unspecified mononeuropathy of unspecified lower limb: Secondary | ICD-10-CM

## 2017-02-02 MED ORDER — GABAPENTIN 100 MG PO CAPS: ORAL_CAPSULE | ORAL | 3 refills | Status: DC

## 2017-02-02 NOTE — Progress Notes (Signed)
She presents today for follow-up of her neuropathy as well as painful elongated toenails. She states that the neuropathy is doing much better particularly at nighttime while taking the gabapentin she states that she still gets a few cramps which awaken her but other than that she's doing much better.  Objective: No change in physical exam tenderness along the patellar dystrophic onychomycotic.  Assessment: Peripheral neuropathy with pain elicited onychomycosis.  Plan: Debridement of toenails 1 through 5 bilateral also increased her gabapentin to 200 mg daily at bedtime I will follow up with her in 3 months.

## 2017-02-28 ENCOUNTER — Ambulatory Visit: Payer: PPO | Admitting: Family Medicine

## 2017-03-07 ENCOUNTER — Encounter: Payer: Self-pay | Admitting: Family Medicine

## 2017-03-07 ENCOUNTER — Ambulatory Visit (INDEPENDENT_AMBULATORY_CARE_PROVIDER_SITE_OTHER): Payer: PPO | Admitting: Family Medicine

## 2017-03-07 VITALS — BP 132/78 | HR 78 | Temp 98.1°F | Resp 18 | Ht 64.0 in | Wt 227.0 lb

## 2017-03-07 DIAGNOSIS — G8929 Other chronic pain: Secondary | ICD-10-CM

## 2017-03-07 DIAGNOSIS — C4492 Squamous cell carcinoma of skin, unspecified: Secondary | ICD-10-CM | POA: Diagnosis not present

## 2017-03-07 DIAGNOSIS — L858 Other specified epidermal thickening: Secondary | ICD-10-CM

## 2017-03-07 DIAGNOSIS — M25512 Pain in left shoulder: Secondary | ICD-10-CM

## 2017-03-07 MED ORDER — HYDROCODONE-ACETAMINOPHEN 5-325 MG PO TABS: 1.0000 | ORAL_TABLET | Freq: Two times a day (BID) | ORAL | 0 refills | Status: DC | PRN

## 2017-03-07 NOTE — Progress Notes (Signed)
Subjective:    Patient ID: [name], female    DOB: 1941-06-17, 76 y.o.   MRN: [id number]  HPI  12/30/16 I see the patient very infrequently. She uses hydrocodone sparingly for osteoarthritic pain in multiple joints primarily in the joints of her hands including the DIP and PIP joints bilaterally as well as her knees and shoulders and lower back. She has a history of hiatal hernia and acid reflux and does not tolerate oral NSAIDs. She uses voltaren gel on her  hands and this seems to help. She has marketed and severe osteoarthritic deformities in the DIP and PIP joints on both hands. She uses the hydrocodone to help her sleep at night. She seldom takes this on a daily basis and uses it sparingly. She also presents with 2 months of pain in her right shoulder. It hurts to abduct her arm greater than 90. She reports pain with internal and external rotation. She has positive empty can sign and a positive Hawkins sign. She is requesting a cortisone injection into the subacromial space. She saw significant benefit when we performed this last year on her left shoulder.  At that time, my plan was: At the present time, the patient is using hydrocodone sparingly for the pain in her multiple joints primarily in her hands feet lower back and knees. I see no reason why she cannot continue to use the hydrocodone once a day at night to help her sleep. There is no evidence of abuse or diversion. Therefore I will gladly refill her medication. I believe she has impingement syndrome of the right shoulder possibly bursitis.  Using sterile technique, I injected the right shoulder with mixture of 2 mL of lidocaine, 2 mL of Marcaine, and 2 mL of 40 mg per mL Kenalog. The patient tolerated the procedure well without complication  02/02/09 Patient presents today for 2 concerns. #1 she would like a cortisone injection in her right shoulder. She saw substantial benefit from the recent injection we performed in her left  shoulder. She is requesting the same be performed in the right. She is. She had a cortisone injection in this joint and also saw substantial benefit from it. She has pain with abduction greater than 90. Pain lifting her arm above her head. Pain with internal and external rotation. She also has soreness in pain at night when she tries to sleep. She has a positive empty can sign and pain with abduction greater than 90. Second issue are skin tags on her neck per her report. There are 2 "skin tags. Both are located on the anterior neck near the thyroid gland. One is a well pedunculated skin tag approximately 4 mm in diameter. The second is a 1 cm cherry red nodular lesion with a hard keratinaceous core. 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 Past Medical History:  Diagnosis Date  . Anemia   . Arthritis   . Breast cancer ([place])   . Depression   . Diverticulosis   . Osteoporosis   . Vitamin D deficiency    Past Surgical History:  Procedure Laterality Date  . ABDOMINAL HYSTERECTOMY    . APPENDECTOMY    . CHOLECYSTECTOMY    . MASTECTOMY    . TONSILLECTOMY     Current Outpatient Prescriptions on File Prior to Visit  Medication Sig Dispense Refill  . albuterol (PROAIR HFA) 108 (90 Base) MCG/ACT inhaler Inhale 2 puffs into the lungs every 6 (six) hours as needed. 1 Inhaler 3  . albuterol (PROVENTIL) (  2.5 MG/3ML) 0.083% nebulizer solution Take 3 mLs (2.5 mg total) by nebulization every 6 (six) hours as needed for wheezing or shortness of breath. 75 mL 1  . cetirizine (ZYRTEC) 10 MG tablet Take 1 tablet (10 mg total) by mouth daily. 30 tablet 11  . Cholecalciferol (VITAMIN D) 2000 UNITS tablet Take 2,000 Units by mouth daily. 09-25-12 pt was instructed to increase from 1000 to 2000 units daily    . diclofenac sodium (VOLTAREN) 1 % GEL APPLY 4 G TOPICALLY 4 TIMES DAILY 100 g 1  . escitalopram (LEXAPRO) 10 MG tablet TAKE ONE TABLET BY MOUTH ONCE DAILY 30 tablet 2  . fluticasone (FLONASE) 50 MCG/ACT nasal spray  2 sprays per nostril daily.please dispense 15.11ml 16 g 5  . gabapentin (NEURONTIN) 100 MG capsule Take two (2) capsules by mouth at bed time. 180 capsule 3  . omeprazole (PRILOSEC) 20 MG capsule TAKE ONE CAPSULE BY MOUTH TWICE DAILY 60 capsule 11   No current facility-administered medications on file prior to visit.    Allergies  Allergen Reactions  . Penicillins Hives   Social History   Social History  . Marital status: Divorced    Spouse name: N/A  . Number of children: N/A  . Years of education: N/A   Occupational History  . Not on file.   Social History Main Topics  . Smoking status: Never Smoker  . Smokeless tobacco: Never Used     Comment: never used tobacco  . Alcohol use No  . Drug use: No  . Sexual activity: Not Currently   Other Topics Concern  . Not on file   Social History Narrative  . No narrative on file      Review of Systems  All other systems reviewed and are negative.      Objective:   Physical Exam  Constitutional: She appears well-developed and well-nourished.  Neck:    Cardiovascular: Normal rate, regular rhythm and normal heart sounds.   No murmur heard. Pulmonary/Chest: Effort normal and breath sounds normal. No respiratory distress. She has no wheezes. She has no rales.  Abdominal: Soft. Bowel sounds are normal. She exhibits no distension. There is no tenderness. There is no rebound.  Musculoskeletal:       Left shoulder: She exhibits decreased range of motion, tenderness, pain and decreased strength.          Assessment & Plan:  Chronic left shoulder pain  Keratoacanthoma - Plan: Pathology  Using sterile technique, the left subacromial space was injected with a mixture of 2 mL of lidocaine, 2 mL of Marcaine, and 2 mL of 40 mg per mL Kenalog. Patient tolerated the procedure well without complication. Next I removed the 2 lesions on her anterior neck. The base of each lesion was anesthetized 0.1% lidocaine with epinephrine. Each  lesion was then removed in its entirety using a razor to shave biopsy technique. Lesion #2 was sent to pathology and labeled container to rule out skin cancer. Hemostasis was achieved with Drysol and a Band-Aid. Await the pathology results

## 2017-03-09 LAB — PATHOLOGY

## 2017-03-30 ENCOUNTER — Ambulatory Visit (INDEPENDENT_AMBULATORY_CARE_PROVIDER_SITE_OTHER): Payer: PPO | Admitting: Family Medicine

## 2017-03-30 VITALS — BP 154/80 | HR 68 | Temp 98.0°F | Resp 20

## 2017-03-30 DIAGNOSIS — C4492 Squamous cell carcinoma of skin, unspecified: Secondary | ICD-10-CM | POA: Diagnosis not present

## 2017-03-30 DIAGNOSIS — IMO0002 Reserved for concepts with insufficient information to code with codable children: Secondary | ICD-10-CM

## 2017-03-30 NOTE — Progress Notes (Signed)
Subjective:    Patient ID: [name], female    DOB: [birth year], 76 y.o.   MRN: [id number]  HPI  12/30/16 I see the patient very infrequently. She uses hydrocodone sparingly for osteoarthritic pain in multiple joints primarily in the joints of her hands including the DIP and PIP joints bilaterally as well as her knees and shoulders and lower back. She has a history of hiatal hernia and acid reflux and does not tolerate oral NSAIDs. She uses voltaren gel on her  hands and this seems to help. She has marketed and severe osteoarthritic deformities in the DIP and PIP joints on both hands. She uses the hydrocodone to help her sleep at night. She seldom takes this on a daily basis and uses it sparingly. She also presents with 2 months of pain in her right shoulder. It hurts to abduct her arm greater than 90. She reports pain with internal and external rotation. She has positive empty can sign and a positive Hawkins sign. She is requesting a cortisone injection into the subacromial space. She saw significant benefit when we performed this last year on her left shoulder.  At that time, my plan was: At the present time, the patient is using hydrocodone sparingly for the pain in her multiple joints primarily in her hands feet lower back and knees. I see no reason why she cannot continue to use the hydrocodone once a day at night to help her sleep. There is no evidence of abuse or diversion. Therefore I will gladly refill her medication. I believe she has impingement syndrome of the right shoulder possibly bursitis.  Using sterile technique, I injected the right shoulder with mixture of 2 mL of lidocaine, 2 mL of Marcaine, and 2 mL of 40 mg per mL Kenalog. The patient tolerated the procedure well without complication  04/01/62 Patient presents today for 2 concerns. #1 she would like a cortisone injection in her right shoulder. She saw substantial benefit from the recent injection we performed in her left  shoulder. She is requesting the same be performed in the right. She is. She had a cortisone injection in this joint and also saw substantial benefit from it. She has pain with abduction greater than 90. Pain lifting her arm above her head. Pain with internal and external rotation. She also has soreness in pain at night when she tries to sleep. She has a positive empty can sign and pain with abduction greater than 90. Second issue are skin tags on her neck per her report. There are 2 "skin tags. Both are located on the anterior neck near the thyroid gland. One is a well pedunculated skin tag approximately 4 mm in diameter. The second is a 1 cm cherry red nodular lesion with a hard keratinaceous core. 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  At that time, my plan was: Using sterile technique, the left subacromial space was injected with a mixture of 2 mL of lidocaine, 2 mL of Marcaine, and 2 mL of 40 mg per mL Kenalog. Patient tolerated the procedure well without complication. Next I removed the 2 lesions on her anterior neck. The base of each lesion was anesthetized 0.1% lidocaine with epinephrine. Each lesion was then removed in its entirety using a razor to shave biopsy technique. Lesion #2 was sent to pathology and labeled container to rule out skin cancer. Hemostasis was achieved with Drysol and a Band-Aid. Await the pathology results  03/30/17 The 1 cm cherry red nodular lesion described above  was found to be a invasive squamous cell carcinoma. The margins were involved. She is here today for wider excision to obtain clear margins Past Medical History:  Diagnosis Date  . Anemia   . Arthritis   . Breast cancer ([place])   . Depression   . Diverticulosis   . Osteoporosis   . Vitamin D deficiency    Past Surgical History:  Procedure Laterality Date  . ABDOMINAL HYSTERECTOMY    . APPENDECTOMY    . CHOLECYSTECTOMY    . MASTECTOMY    . TONSILLECTOMY     Current Outpatient Prescriptions on File Prior to  Visit  Medication Sig Dispense Refill  . albuterol (PROAIR HFA) 108 (90 Base) MCG/ACT inhaler Inhale 2 puffs into the lungs every 6 (six) hours as needed. 1 Inhaler 3  . albuterol (PROVENTIL) (2.5 MG/3ML) 0.083% nebulizer solution Take 3 mLs (2.5 mg total) by nebulization every 6 (six) hours as needed for wheezing or shortness of breath. 75 mL 1  . cetirizine (ZYRTEC) 10 MG tablet Take 1 tablet (10 mg total) by mouth daily. 30 tablet 11  . Cholecalciferol (VITAMIN D) 2000 UNITS tablet Take 2,000 Units by mouth daily. 09-25-12 pt was instructed to increase from 1000 to 2000 units daily    . diclofenac sodium (VOLTAREN) 1 % GEL APPLY 4 G TOPICALLY 4 TIMES DAILY 100 g 1  . escitalopram (LEXAPRO) 10 MG tablet TAKE ONE TABLET BY MOUTH ONCE DAILY 30 tablet 2  . fluticasone (FLONASE) 50 MCG/ACT nasal spray 2 sprays per nostril daily.please dispense 15.78ml 16 g 5  . gabapentin (NEURONTIN) 100 MG capsule Take two (2) capsules by mouth at bed time. 180 capsule 3  . HYDROcodone-acetaminophen (NORCO/VICODIN) 5-325 MG tablet Take 1 tablet by mouth 2 (two) times daily as needed. 60 tablet 0  . omeprazole (PRILOSEC) 20 MG capsule TAKE ONE CAPSULE BY MOUTH TWICE DAILY 60 capsule 11   No current facility-administered medications on file prior to visit.    Allergies  Allergen Reactions  . Penicillins Hives   Social History   Social History  . Marital status: Divorced    Spouse name: N/A  . Number of children: N/A  . Years of education: N/A   Occupational History  . Not on file.   Social History Main Topics  . Smoking status: Never Smoker  . Smokeless tobacco: Never Used     Comment: never used tobacco  . Alcohol use No  . Drug use: No  . Sexual activity: Not Currently   Other Topics Concern  . Not on file   Social History Narrative  . No narrative on file      Review of Systems  All other systems reviewed and are negative.      Objective:   Physical Exam  Constitutional: She  appears well-developed and well-nourished.  Neck:    Cardiovascular: Normal rate, regular rhythm and normal heart sounds.   No murmur heard. Pulmonary/Chest: Effort normal and breath sounds normal. No respiratory distress. She has no wheezes. She has no rales.          Assessment & Plan:  Invasive Squamous Cell Carcinoma  The area labeled site #2 was anesthetized with 0.1% lidocaine with epinephrine. A 2 cm x 2.5 cm elliptical excision was performed around the entire site down to the underlying subcutaneous fat. Skin edges were then approximated using 5 simple interrupted 3-0 Ethilon sutures. Wound care was discussed. Lesion was sent to pathology and labeled container to ensure clear  margins.

## 2017-03-31 DIAGNOSIS — L905 Scar conditions and fibrosis of skin: Secondary | ICD-10-CM | POA: Diagnosis not present

## 2017-04-04 LAB — PATHOLOGY

## 2017-04-27 ENCOUNTER — Telehealth: Payer: Self-pay | Admitting: Family Medicine

## 2017-04-27 MED ORDER — HYDROCODONE-ACETAMINOPHEN 5-325 MG PO TABS: 1.0000 | ORAL_TABLET | Freq: Two times a day (BID) | ORAL | 0 refills | Status: DC | PRN

## 2017-04-27 NOTE — Telephone Encounter (Signed)
ok 

## 2017-04-27 NOTE — Telephone Encounter (Signed)
Ok to refill 

## 2017-04-27 NOTE — Telephone Encounter (Signed)
Pt needs refill on hydrocodone.  °

## 2017-04-27 NOTE — Telephone Encounter (Signed)
RX printed, left up front and patient aware to pick up  

## 2017-05-01 ENCOUNTER — Encounter: Payer: Self-pay | Admitting: Family Medicine

## 2017-05-01 ENCOUNTER — Ambulatory Visit (INDEPENDENT_AMBULATORY_CARE_PROVIDER_SITE_OTHER): Payer: PPO | Admitting: Family Medicine

## 2017-05-01 VITALS — BP 162/88 | HR 72 | Temp 97.9°F | Resp 16 | Ht 64.0 in | Wt 224.0 lb

## 2017-05-01 DIAGNOSIS — I890 Lymphedema, not elsewhere classified: Secondary | ICD-10-CM | POA: Diagnosis not present

## 2017-05-01 NOTE — Progress Notes (Signed)
Subjective:    Patient ID: [name], female    DOB: [birth year], 76 y.o.   MRN: [id number]  HPI  12/30/16 I see the patient very infrequently. She uses hydrocodone sparingly for osteoarthritic pain in multiple joints primarily in the joints of her hands including the DIP and PIP joints bilaterally as well as her knees and shoulders and lower back. She has a history of hiatal hernia and acid reflux and does not tolerate oral NSAIDs. She uses voltaren gel on her  hands and this seems to help. She has marketed and severe osteoarthritic deformities in the DIP and PIP joints on both hands. She uses the hydrocodone to help her sleep at night. She seldom takes this on a daily basis and uses it sparingly. She also presents with 2 months of pain in her right shoulder. It hurts to abduct her arm greater than 90. She reports pain with internal and external rotation. She has positive empty can sign and a positive Hawkins sign. She is requesting a cortisone injection into the subacromial space. She saw significant benefit when we performed this last year on her left shoulder.  At that time, my plan was: At the present time, the patient is using hydrocodone sparingly for the pain in her multiple joints primarily in her hands feet lower back and knees. I see no reason why she cannot continue to use the hydrocodone once a day at night to help her sleep. There is no evidence of abuse or diversion. Therefore I will gladly refill her medication. I believe she has impingement syndrome of the right shoulder possibly bursitis.  Using sterile technique, I injected the right shoulder with mixture of 2 mL of lidocaine, 2 mL of Marcaine, and 2 mL of 40 mg per mL Kenalog. The patient tolerated the procedure well without complication  03/01/00 Patient presents today for 2 concerns. #1 she would like a cortisone injection in her right shoulder. She saw substantial benefit from the recent injection we performed in her left  shoulder. She is requesting the same be performed in the right. She is. She had a cortisone injection in this joint and also saw substantial benefit from it. She has pain with abduction greater than 90. Pain lifting her arm above her head. Pain with internal and external rotation. She also has soreness in pain at night when she tries to sleep. She has a positive empty can sign and pain with abduction greater than 90. Second issue are skin tags on her neck per her report. There are 2 "skin tags. Both are located on the anterior neck near the thyroid gland. One is a well pedunculated skin tag approximately 4 mm in diameter. The second is a 1 cm cherry red nodular lesion with a hard keratinaceous core. 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  AT that time, my plan was: Using sterile technique, the left subacromial space was injected with a mixture of 2 mL of lidocaine, 2 mL of Marcaine, and 2 mL of 40 mg per mL Kenalog. Patient tolerated the procedure well without complication. Next I removed the 2 lesions on her anterior neck. The base of each lesion was anesthetized 0.1% lidocaine with epinephrine. Each lesion was then removed in its entirety using a razor to shave biopsy technique. Lesion #2 was sent to pathology and labeled container to rule out skin cancer. Hemostasis was achieved with Drysol and a Band-Aid. Await the pathology results  05/01/17 Patient has a history of mastectomy of her left  breast. She states that 17 lymph nodes removed from her left breast and her left axilla. She is dull with lymphedema in the left arm ever since that time. Recently the swelling in the left arm has become much worse. She has +1 pitting edema in the dorsum of her left hand, +1 pitting edema in her left forearm. The edema extends all the way up to the elbow. She does not wear any type of compression stocking. There is no erythema. There is no warmth. There is no evidence of cellulitis. She has not had any recent intervention that  would cause a DVT. She has adequate circulation that she has a palpable radial and ulnar pulse bilaterally. She has excellent capillary refill. I suspect that the swelling is secondary to lymphedema Past Medical History:  Diagnosis Date  . Anemia   . Arthritis   . Breast cancer ([place])   . Depression   . Diverticulosis   . Osteoporosis   . Vitamin D deficiency    Past Surgical History:  Procedure Laterality Date  . ABDOMINAL HYSTERECTOMY    . APPENDECTOMY    . CHOLECYSTECTOMY    . MASTECTOMY    . TONSILLECTOMY     Current Outpatient Prescriptions on File Prior to Visit  Medication Sig Dispense Refill  . albuterol (PROAIR HFA) 108 (90 Base) MCG/ACT inhaler Inhale 2 puffs into the lungs every 6 (six) hours as needed. 1 Inhaler 3  . albuterol (PROVENTIL) (2.5 MG/3ML) 0.083% nebulizer solution Take 3 mLs (2.5 mg total) by nebulization every 6 (six) hours as needed for wheezing or shortness of breath. 75 mL 1  . cetirizine (ZYRTEC) 10 MG tablet Take 1 tablet (10 mg total) by mouth daily. 30 tablet 11  . Cholecalciferol (VITAMIN D) 2000 UNITS tablet Take 2,000 Units by mouth daily. 09-25-12 pt was instructed to increase from 1000 to 2000 units daily    . diclofenac sodium (VOLTAREN) 1 % GEL APPLY 4 G TOPICALLY 4 TIMES DAILY 100 g 1  . escitalopram (LEXAPRO) 10 MG tablet TAKE ONE TABLET BY MOUTH ONCE DAILY 30 tablet 2  . fluticasone (FLONASE) 50 MCG/ACT nasal spray 2 sprays per nostril daily.please dispense 15.74ml 16 g 5  . gabapentin (NEURONTIN) 100 MG capsule Take two (2) capsules by mouth at bed time. 180 capsule 3  . HYDROcodone-acetaminophen (NORCO/VICODIN) 5-325 MG tablet Take 1 tablet by mouth 2 (two) times daily as needed. 60 tablet 0  . omeprazole (PRILOSEC) 20 MG capsule TAKE ONE CAPSULE BY MOUTH TWICE DAILY 60 capsule 11   No current facility-administered medications on file prior to visit.    Allergies  Allergen Reactions  . Penicillins Hives   Social History   Social  History  . Marital status: Divorced    Spouse name: N/A  . Number of children: N/A  . Years of education: N/A   Occupational History  . Not on file.   Social History Main Topics  . Smoking status: Never Smoker  . Smokeless tobacco: Never Used     Comment: never used tobacco  . Alcohol use No  . Drug use: No  . Sexual activity: Not Currently   Other Topics Concern  . Not on file   Social History Narrative  . No narrative on file      Review of Systems  All other systems reviewed and are negative.      Objective:   Physical Exam  Constitutional: She appears well-developed and well-nourished.  Cardiovascular: Normal rate, regular rhythm  and normal heart sounds.   No murmur heard. Pulmonary/Chest: Effort normal and breath sounds normal. No respiratory distress. She has no wheezes. She has no rales.  Abdominal: Soft. Bowel sounds are normal. She exhibits no distension. There is no tenderness. There is no rebound.  Musculoskeletal:       Left elbow: She exhibits swelling. She exhibits normal range of motion, no effusion and no deformity. No tenderness found.       Left wrist: She exhibits swelling. She exhibits normal range of motion.       Left forearm: She exhibits swelling. She exhibits no tenderness and no bony tenderness.       Left hand: She exhibits swelling. She exhibits normal range of motion.  Nursing note reviewed.         Assessment & Plan:  Lymphedema of left arm  I believe the swelling in her left upper extremity is secondary to lymphedema. I do not suspect a DVT. She has had no recent interventions that would cause a DVT in the extremity. She has no pain erythema or warmth.  Patient was placed in a compression from her hand to her elbow wrapping first with calamine impregnated gauze and second with coban.  Recheck here on Thursday or sooner if worse.

## 2017-05-04 ENCOUNTER — Encounter: Payer: Self-pay | Admitting: Family Medicine

## 2017-05-04 ENCOUNTER — Ambulatory Visit (INDEPENDENT_AMBULATORY_CARE_PROVIDER_SITE_OTHER): Payer: PPO | Admitting: Family Medicine

## 2017-05-04 VITALS — BP 132/90 | HR 72 | Temp 97.5°F | Resp 15 | Ht 67.0 in | Wt 227.0 lb

## 2017-05-04 DIAGNOSIS — I890 Lymphedema, not elsewhere classified: Secondary | ICD-10-CM

## 2017-05-04 NOTE — Progress Notes (Signed)
Subjective:    Patient ID: [name], female    DOB: 09-02-1940, 76 y.o.   MRN: [id number]  HPI  12/30/16 I see the patient very infrequently. She uses hydrocodone sparingly for osteoarthritic pain in multiple joints primarily in the joints of her hands including the DIP and PIP joints bilaterally as well as her knees and shoulders and lower back. She has a history of hiatal hernia and acid reflux and does not tolerate oral NSAIDs. She uses voltaren gel on her  hands and this seems to help. She has marketed and severe osteoarthritic deformities in the DIP and PIP joints on both hands. She uses the hydrocodone to help her sleep at night. She seldom takes this on a daily basis and uses it sparingly. She also presents with 2 months of pain in her right shoulder. It hurts to abduct her arm greater than 90. She reports pain with internal and external rotation. She has positive empty can sign and a positive Hawkins sign. She is requesting a cortisone injection into the subacromial space. She saw significant benefit when we performed this last year on her left shoulder.  At that time, my plan was: At the present time, the patient is using hydrocodone sparingly for the pain in her multiple joints primarily in her hands feet lower back and knees. I see no reason why she cannot continue to use the hydrocodone once a day at night to help her sleep. There is no evidence of abuse or diversion. Therefore I will gladly refill her medication. I believe she has impingement syndrome of the right shoulder possibly bursitis.  Using sterile technique, I injected the right shoulder with mixture of 2 mL of lidocaine, 2 mL of Marcaine, and 2 mL of 40 mg per mL Kenalog. The patient tolerated the procedure well without complication  10/02/17 Patient presents today for 2 concerns. #1 she would like a cortisone injection in her right shoulder. She saw substantial benefit from the recent injection we performed in her left  shoulder. She is requesting the same be performed in the right. She is. She had a cortisone injection in this joint and also saw substantial benefit from it. She has pain with abduction greater than 90. Pain lifting her arm above her head. Pain with internal and external rotation. She also has soreness in pain at night when she tries to sleep. She has a positive empty can sign and pain with abduction greater than 90. Second issue are skin tags on her neck per her report. There are 2 "skin tags. Both are located on the anterior neck near the thyroid gland. One is a well pedunculated skin tag approximately 4 mm in diameter. The second is a 1 cm cherry red nodular lesion with a hard keratinaceous core. 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  AT that time, my plan was: Using sterile technique, the left subacromial space was injected with a mixture of 2 mL of lidocaine, 2 mL of Marcaine, and 2 mL of 40 mg per mL Kenalog. Patient tolerated the procedure well without complication. Next I removed the 2 lesions on her anterior neck. The base of each lesion was anesthetized 0.1% lidocaine with epinephrine. Each lesion was then removed in its entirety using a razor to shave biopsy technique. Lesion #2 was sent to pathology and labeled container to rule out skin cancer. Hemostasis was achieved with Drysol and a Band-Aid. Await the pathology results  05/01/17 Patient has a history of mastectomy of her left  breast. She states that 17 lymph nodes removed from her left breast and her left axilla. She is dull with lymphedema in the left arm ever since that time. Recently the swelling in the left arm has become much worse. She has +1 pitting edema in the dorsum of her left hand, +1 pitting edema in her left forearm. The edema extends all the way up to the elbow. She does not wear any type of compression stocking. There is no erythema. There is no warmth. There is no evidence of cellulitis. She has not had any recent intervention that  would cause a DVT. She has adequate circulation that she has a palpable radial and ulnar pulse bilaterally. She has excellent capillary refill. I suspect that the swelling is secondary to lymphedema.  At that time, my plan was: I believe the swelling in her left upper extremity is secondary to lymphedema. I do not suspect a DVT. She has had no recent interventions that would cause a DVT in the extremity. She has no pain erythema or warmth.  Patient was placed in a compression from her hand to her elbow wrapping first with calamine impregnated gauze and second with coban.  Recheck here on Thursday or sooner if worse.    05/04/17 I removed the compression wrap from her arm day. The swelling was dramatically improved and the left arm was symmetric with the right arm. There is no erythema. There is no rash. There is no weeping edema. There is no pain in the arm. Past Medical History:  Diagnosis Date  . Anemia   . Arthritis   . Breast cancer ([place])   . Depression   . Diverticulosis   . Osteoporosis   . Vitamin D deficiency    Past Surgical History:  Procedure Laterality Date  . ABDOMINAL HYSTERECTOMY    . APPENDECTOMY    . CHOLECYSTECTOMY    . MASTECTOMY    . TONSILLECTOMY     Current Outpatient Prescriptions on File Prior to Visit  Medication Sig Dispense Refill  . albuterol (PROAIR HFA) 108 (90 Base) MCG/ACT inhaler Inhale 2 puffs into the lungs every 6 (six) hours as needed. 1 Inhaler 3  . albuterol (PROVENTIL) (2.5 MG/3ML) 0.083% nebulizer solution Take 3 mLs (2.5 mg total) by nebulization every 6 (six) hours as needed for wheezing or shortness of breath. 75 mL 1  . cetirizine (ZYRTEC) 10 MG tablet Take 1 tablet (10 mg total) by mouth daily. 30 tablet 11  . Cholecalciferol (VITAMIN D) 2000 UNITS tablet Take 2,000 Units by mouth daily. 09-25-12 pt was instructed to increase from 1000 to 2000 units daily    . diclofenac sodium (VOLTAREN) 1 % GEL APPLY 4 G TOPICALLY 4 TIMES DAILY 100 g 1  .  escitalopram (LEXAPRO) 10 MG tablet TAKE ONE TABLET BY MOUTH ONCE DAILY 30 tablet 2  . fluticasone (FLONASE) 50 MCG/ACT nasal spray 2 sprays per nostril daily.please dispense 15.41ml 16 g 5  . gabapentin (NEURONTIN) 100 MG capsule Take two (2) capsules by mouth at bed time. 180 capsule 3  . HYDROcodone-acetaminophen (NORCO/VICODIN) 5-325 MG tablet Take 1 tablet by mouth 2 (two) times daily as needed. 60 tablet 0  . omeprazole (PRILOSEC) 20 MG capsule TAKE ONE CAPSULE BY MOUTH TWICE DAILY 60 capsule 11   No current facility-administered medications on file prior to visit.    Allergies  Allergen Reactions  . Penicillins Hives   Social History   Social History  . Marital status: Divorced  Spouse name: N/A  . Number of children: N/A  . Years of education: N/A   Occupational History  . Not on file.   Social History Main Topics  . Smoking status: Never Smoker  . Smokeless tobacco: Never Used     Comment: never used tobacco  . Alcohol use No  . Drug use: No  . Sexual activity: Not Currently   Other Topics Concern  . Not on file   Social History Narrative  . No narrative on file      Review of Systems  All other systems reviewed and are negative.      Objective:   Physical Exam  Constitutional: She appears well-developed and well-nourished.  Cardiovascular: Normal rate, regular rhythm and normal heart sounds.   No murmur heard. Pulmonary/Chest: Effort normal and breath sounds normal. No respiratory distress. She has no wheezes. She has no rales.  Abdominal: Soft. Bowel sounds are normal. She exhibits no distension. There is no tenderness. There is no rebound.  Musculoskeletal:       Left elbow: She exhibits normal range of motion, no swelling, no effusion and no deformity. No tenderness found.       Left wrist: She exhibits normal range of motion and no swelling.       Left forearm: She exhibits no tenderness, no bony tenderness and no swelling.       Left hand: She  exhibits normal range of motion and no swelling.  Nursing note reviewed.         Assessment & Plan:  Lymphedema of left arm  Lymphedema has been controlled by the compression wrap that was applied on Tuesday. Therefore I wrote the patient a prescription for compression stockings from the hand to the shoulder length 15-20 mmHg strength. I instructed the patient to pick this up at a medical supply store to control her lymphedema and to wear it on a daily basis

## 2017-05-08 DIAGNOSIS — I890 Lymphedema, not elsewhere classified: Secondary | ICD-10-CM | POA: Diagnosis not present

## 2017-05-11 ENCOUNTER — Ambulatory Visit: Payer: PPO | Admitting: Podiatry

## 2017-05-19 ENCOUNTER — Other Ambulatory Visit: Payer: Self-pay | Admitting: Hematology & Oncology

## 2017-05-19 ENCOUNTER — Other Ambulatory Visit: Payer: Self-pay | Admitting: Family Medicine

## 2017-05-19 NOTE — Telephone Encounter (Signed)
Medication refilled per protocol. 

## 2017-06-06 ENCOUNTER — Encounter: Payer: Self-pay | Admitting: Family Medicine

## 2017-06-06 ENCOUNTER — Ambulatory Visit: Payer: PPO | Admitting: Family Medicine

## 2017-06-06 VITALS — BP 126/70 | HR 60 | Temp 97.8°F | Resp 16 | Ht 64.0 in | Wt 225.0 lb

## 2017-06-06 DIAGNOSIS — M7541 Impingement syndrome of right shoulder: Secondary | ICD-10-CM

## 2017-06-06 DIAGNOSIS — J0100 Acute maxillary sinusitis, unspecified: Secondary | ICD-10-CM | POA: Diagnosis not present

## 2017-06-06 DIAGNOSIS — M25512 Pain in left shoulder: Secondary | ICD-10-CM

## 2017-06-06 DIAGNOSIS — G8929 Other chronic pain: Secondary | ICD-10-CM | POA: Diagnosis not present

## 2017-06-06 MED ORDER — AZITHROMYCIN 250 MG PO TABS: ORAL_TABLET | ORAL | 0 refills | Status: DC

## 2017-06-06 NOTE — Progress Notes (Signed)
Subjective:    Patient ID: [name], female    DOB: 02-Jan-1941, 76 y.o.   MRN: [id number]  HPI  12/30/16 I see the patient very infrequently. She uses hydrocodone sparingly for osteoarthritic pain in multiple joints primarily in the joints of her hands including the DIP and PIP joints bilaterally as well as her knees and shoulders and lower back. She has a history of hiatal hernia and acid reflux and does not tolerate oral NSAIDs. She uses voltaren gel on her  hands and this seems to help. She has marketed and severe osteoarthritic deformities in the DIP and PIP joints on both hands. She uses the hydrocodone to help her sleep at night. She seldom takes this on a daily basis and uses it sparingly. She also presents with 2 months of pain in her right shoulder. It hurts to abduct her arm greater than 90. She reports pain with internal and external rotation. She has positive empty can sign and a positive Hawkins sign. She is requesting a cortisone injection into the subacromial space. She saw significant benefit when we performed this last year on her left shoulder.  At that time, my plan was: At the present time, the patient is using hydrocodone sparingly for the pain in her multiple joints primarily in her hands feet lower back and knees. I see no reason why she cannot continue to use the hydrocodone once a day at night to help her sleep. There is no evidence of abuse or diversion. Therefore I will gladly refill her medication. I believe she has impingement syndrome of the right shoulder possibly bursitis.  Using sterile technique, I injected the right shoulder with mixture of 2 mL of lidocaine, 2 mL of Marcaine, and 2 mL of 40 mg per mL Kenalog. The patient tolerated the procedure well without complication  04/05/08 Patient presents today for 2 concerns. #1 she would like a cortisone injection in her right shoulder. She saw substantial benefit from the recent injection we performed in her left  shoulder. She is requesting the same be performed in the right. She is. She had a cortisone injection in this joint and also saw substantial benefit from it. She has pain with abduction greater than 90. Pain lifting her arm above her head. Pain with internal and external rotation. She also has soreness in pain at night when she tries to sleep. She has a positive empty can sign and pain with abduction greater than 90. Second issue are skin tags on her neck per her report. There are 2 "skin tags. Both are located on the anterior neck near the thyroid gland. One is a well pedunculated skin tag approximately 4 mm in diameter. The second is a 1 cm cherry red nodular lesion with a hard keratinaceous core. 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 At that time, my plan was: Using sterile technique, the left subacromial space was injected with a mixture of 2 mL of lidocaine, 2 mL of Marcaine, and 2 mL of 40 mg per mL Kenalog. Patient tolerated the procedure well without complication. Next I removed the 2 lesions on her anterior neck. The base of each lesion was anesthetized 0.1% lidocaine with epinephrine. Each lesion was then removed in its entirety using a razor to shave biopsy technique. Lesion #2 was sent to pathology and labeled container to rule out skin cancer. Hemostasis was achieved with Drysol and a Band-Aid. Await the pathology results  06/06/17 Patient saw substantial benefit from the original cortisone injection.  However the pain has returned in her left shoulder.  She again complains of pain abducting her shoulder greater than 90 degrees.  It is difficult for her to raise her arm above her head or to even put a shirt on.  It hurts when she tries to sleep at night.  She is requesting a repeat cortisone injection.  We discussed the risk and benefits of this.  Patient has no desire for surgery at the present time.  She would like to try an additional cortisone injection prior to proceeding with any imaging of the  shoulder.  She also complains of pain in her left frontal and left maxillary sinus.  Pain is been present for more than 10 days.  Symptoms include rhinorrhea, postnasal drip, subjective fevers, itchy watery eyes, sneezing.  On exam today, she has tenderness to palpation on the left maxillary sinus. Past Medical History:  Diagnosis Date  . Anemia   . Arthritis   . Breast cancer ([place])   . Depression   . Diverticulosis   . Lymphedema of left arm   . Osteoporosis   . Vitamin D deficiency    Past Surgical History:  Procedure Laterality Date  . ABDOMINAL HYSTERECTOMY    . APPENDECTOMY    . CHOLECYSTECTOMY    . MASTECTOMY    . TONSILLECTOMY     Current Outpatient Medications on File Prior to Visit  Medication Sig Dispense Refill  . albuterol (PROAIR HFA) 108 (90 Base) MCG/ACT inhaler Inhale 2 puffs into the lungs every 6 (six) hours as needed. 1 Inhaler 3  . albuterol (PROVENTIL) (2.5 MG/3ML) 0.083% nebulizer solution Take 3 mLs (2.5 mg total) by nebulization every 6 (six) hours as needed for wheezing or shortness of breath. 75 mL 1  . cetirizine (ZYRTEC) 10 MG tablet Take 1 tablet (10 mg total) by mouth daily. 30 tablet 11  . Cholecalciferol (VITAMIN D) 2000 UNITS tablet Take 2,000 Units by mouth daily. 09-25-12 pt was instructed to increase from 1000 to 2000 units daily    . diclofenac sodium (VOLTAREN) 1 % GEL APPLY 4 G TOPICALLY 4 TIMES DAILY 100 g 1  . escitalopram (LEXAPRO) 10 MG tablet TAKE 1 TABLET BY MOUTH ONCE DAILY 30 tablet 2  . fluticasone (FLONASE) 50 MCG/ACT nasal spray 2 sprays per nostril daily.please dispense 15.42ml 16 g 5  . gabapentin (NEURONTIN) 100 MG capsule Take two (2) capsules by mouth at bed time. 180 capsule 3  . HYDROcodone-acetaminophen (NORCO/VICODIN) 5-325 MG tablet Take 1 tablet by mouth 2 (two) times daily as needed. 60 tablet 0  . omeprazole (PRILOSEC) 20 MG capsule TAKE ONE CAPSULE BY MOUTH TWICE DAILY 180 capsule 3   No current facility-administered  medications on file prior to visit.    Allergies  Allergen Reactions  . Penicillins Hives   Social History   Socioeconomic History  . Marital status: Divorced    Spouse name: Not on file  . Number of children: Not on file  . Years of education: Not on file  . Highest education level: Not on file  Social Needs  . Financial resource strain: Not on file  . Food insecurity - worry: Not on file  . Food insecurity - inability: Not on file  . Transportation needs - medical: Not on file  . Transportation needs - non-medical: Not on file  Occupational History  . Not on file  Tobacco Use  . Smoking status: Never Smoker  . Smokeless tobacco: Never Used  . Tobacco  comment: never used tobacco  Substance and Sexual Activity  . Alcohol use: No    Alcohol/week: 0.0 oz  . Drug use: No  . Sexual activity: Not Currently  Other Topics Concern  . Not on file  Social History Narrative  . Not on file      Review of Systems  All other systems reviewed and are negative.      Objective:   Physical Exam  Constitutional: She appears well-developed and well-nourished.  HENT:  Right Ear: Tympanic membrane and ear canal normal.  Left Ear: Tympanic membrane and ear canal normal.  Nose: Mucosal edema and rhinorrhea present. Left sinus exhibits maxillary sinus tenderness and frontal sinus tenderness.  Cardiovascular: Normal rate, regular rhythm and normal heart sounds.  No murmur heard. Pulmonary/Chest: Effort normal and breath sounds normal. No respiratory distress. She has no wheezes. She has no rales.  Abdominal: Soft. Bowel sounds are normal. She exhibits no distension. There is no tenderness. There is no rebound.  Musculoskeletal:       Left shoulder: She exhibits decreased range of motion, tenderness, pain and decreased strength.          Assessment & Plan:  Acute maxillary sinusitis, recurrence not specified - Plan: azithromycin (ZITHROMAX) 250 MG tablet  Impingement syndrome  of right shoulder  Chronic left shoulder pain Patient has chronic pain in her left shoulder.  She has significant crepitus and decreased range of motion.  With great difficulty today, I injected the subacromial space with 2 cc of lidocaine, 2 cc of Marcaine, and 2 cc of 40 mg/mL Kenalog.  Patient tolerated the procedure well without complication.  I will treat her sinusitis with a Z-Pak.  If the patient's shoulder pain does not improve or if it continues to worsen, I would recommend imaging of the shoulder including a basic x-ray.  I suspect that the patient has significant glenohumeral joint arthritis and may possibly benefit from discussions with an orthopedic surgeon regarding possible shoulder replacement.

## 2017-06-15 ENCOUNTER — Telehealth: Payer: Self-pay | Admitting: Family Medicine

## 2017-06-15 NOTE — Telephone Encounter (Signed)
Pt needs refill on hydrocodone.  °

## 2017-06-15 NOTE — Telephone Encounter (Signed)
Ok to refill 

## 2017-06-16 MED ORDER — HYDROCODONE-ACETAMINOPHEN 5-325 MG PO TABS: 1.0000 | ORAL_TABLET | Freq: Two times a day (BID) | ORAL | 0 refills | Status: DC | PRN

## 2017-06-16 NOTE — Telephone Encounter (Signed)
RX printed, left up front and patient aware to pick up after 2 pm via vm 

## 2017-07-20 ENCOUNTER — Encounter: Payer: Self-pay | Admitting: Podiatry

## 2017-07-20 ENCOUNTER — Ambulatory Visit: Payer: PPO | Admitting: Podiatry

## 2017-07-20 DIAGNOSIS — S90212A Contusion of left great toe with damage to nail, initial encounter: Secondary | ICD-10-CM | POA: Diagnosis not present

## 2017-07-20 DIAGNOSIS — M79674 Pain in right toe(s): Secondary | ICD-10-CM

## 2017-07-20 DIAGNOSIS — M79675 Pain in left toe(s): Secondary | ICD-10-CM | POA: Diagnosis not present

## 2017-07-20 DIAGNOSIS — B351 Tinea unguium: Secondary | ICD-10-CM

## 2017-07-20 MED ORDER — DOXYCYCLINE HYCLATE 100 MG PO CAPS: 100.0000 mg | ORAL_CAPSULE | Freq: Two times a day (BID) | ORAL | 1 refills | Status: DC

## 2017-07-20 NOTE — Progress Notes (Signed)
She presents today concerned about her redness to her first in her third toe of her left foot.  She states is been bothering her for the fast last few days and it seems like the toenail may have an infection.  She denies any trauma.  Objective: Vital signs are stable she is alert and oriented x3 severe hallux valgus deformity with a cocked up hammertoe deformity second digit of the left foot and hammertoe deformity of the third.  There appears to be a subungual abscess or loosening of the nail hallux left.  This was debrided sharply today and purulence was removed and the area was copiously lavaged.  The remainder of her nails were also thick yellow dystrophic click mycotic.  Assessment: Secondary to onychomycosis.  Plan: Start her on doxycycline debrided nails recommended Epsom salts warm water soaks follow-up with her in 1-2 weeks

## 2017-08-14 ENCOUNTER — Other Ambulatory Visit: Payer: Self-pay | Admitting: Family Medicine

## 2017-08-14 DIAGNOSIS — J441 Chronic obstructive pulmonary disease with (acute) exacerbation: Secondary | ICD-10-CM

## 2017-08-14 DIAGNOSIS — J209 Acute bronchitis, unspecified: Secondary | ICD-10-CM

## 2017-08-14 MED ORDER — HYDROCODONE-ACETAMINOPHEN 5-325 MG PO TABS: 1.0000 | ORAL_TABLET | Freq: Two times a day (BID) | ORAL | 0 refills | Status: DC | PRN

## 2017-08-14 MED ORDER — ALBUTEROL SULFATE HFA 108 (90 BASE) MCG/ACT IN AERS
2.0000 | INHALATION_SPRAY | Freq: Four times a day (QID) | RESPIRATORY_TRACT | 3 refills | Status: DC | PRN
Start: 2017-08-14 — End: 2018-05-22

## 2017-08-14 NOTE — Telephone Encounter (Signed)
[facility]  Patient is calling to get refill on her hydrocodone and her albuteral

## 2017-08-14 NOTE — Telephone Encounter (Signed)
Patient is requesting a refill on Hydrocodone   LOV: 06/06/17  LRF:   06/16/17

## 2017-09-27 ENCOUNTER — Other Ambulatory Visit: Payer: Self-pay | Admitting: Family Medicine

## 2017-09-27 NOTE — Telephone Encounter (Signed)
Refill on hydrocodone to [facility] [place].  °

## 2017-09-27 NOTE — Telephone Encounter (Signed)
Patient is requesting a refill on Hydrocodone   LOV: 06/06/17  LRF:   08/14/17

## 2017-09-27 NOTE — Addendum Note (Signed)
Addended by: Shary Decamp B on: 09/27/2017 11:39 AM   Modules accepted: Orders

## 2017-09-28 MED ORDER — HYDROCODONE-ACETAMINOPHEN 5-325 MG PO TABS: 1.0000 | ORAL_TABLET | Freq: Two times a day (BID) | ORAL | 0 refills | Status: DC | PRN

## 2017-10-05 ENCOUNTER — Ambulatory Visit (INDEPENDENT_AMBULATORY_CARE_PROVIDER_SITE_OTHER): Payer: PPO | Admitting: Family Medicine

## 2017-10-05 ENCOUNTER — Encounter: Payer: Self-pay | Admitting: Family Medicine

## 2017-10-05 VITALS — BP 130/80 | HR 62 | Temp 97.9°F | Resp 18 | Ht 64.0 in | Wt 220.0 lb

## 2017-10-05 DIAGNOSIS — E7800 Pure hypercholesterolemia, unspecified: Secondary | ICD-10-CM | POA: Diagnosis not present

## 2017-10-05 DIAGNOSIS — Z0000 Encounter for general adult medical examination without abnormal findings: Secondary | ICD-10-CM | POA: Diagnosis not present

## 2017-10-05 DIAGNOSIS — Z23 Encounter for immunization: Secondary | ICD-10-CM | POA: Diagnosis not present

## 2017-10-05 LAB — COMPLETE METABOLIC PANEL WITH GFR
AG Ratio: 1.7 (calc) (ref 1.0–2.5)
ALBUMIN MSPROF: 3.6 g/dL (ref 3.6–5.1)
ALKALINE PHOSPHATASE (APISO): 65 U/L (ref 33–130)
ALT: 9 U/L (ref 6–29)
AST: 17 U/L (ref 10–35)
BILIRUBIN TOTAL: 0.9 mg/dL (ref 0.2–1.2)
BUN: 9 mg/dL (ref 7–25)
CHLORIDE: 103 mmol/L (ref 98–110)
CO2: 30 mmol/L (ref 20–32)
Calcium: 9 mg/dL (ref 8.6–10.4)
Creat: 0.68 mg/dL (ref 0.60–0.93)
GFR, Est African American: 97 mL/min/{1.73_m2} (ref >=60)
GFR, Est Non African American: 84 mL/min/{1.73_m2} (ref >=60)
GLOBULIN: 2.1 g/dL (ref 1.9–3.7)
Glucose, Bld: 96 mg/dL (ref 65–99)
POTASSIUM: 4.6 mmol/L (ref 3.5–5.3)
SODIUM: 140 mmol/L (ref 135–146)
Total Protein: 5.7 g/dL — ABNORMAL LOW (ref 6.1–8.1)

## 2017-10-05 LAB — LIPID PANEL
CHOL/HDL RATIO: 3.1 (calc) (ref <5.0)
CHOLESTEROL: 180 mg/dL (ref <200)
HDL: 58 mg/dL (ref >50)
LDL Cholesterol (Calc): 105 mg/dL (calc) — ABNORMAL HIGH
Non-HDL Cholesterol (Calc): 122 mg/dL (calc) (ref <130)
Triglycerides: 77 mg/dL (ref <150)

## 2017-10-05 LAB — CBC WITH DIFFERENTIAL/PLATELET
BASOS ABS: 40 {cells}/uL (ref 0–200)
Basophils Relative: 0.7 %
EOS ABS: 177 {cells}/uL (ref 15–500)
Eosinophils Relative: 3.1 %
HEMATOCRIT: 42.2 % (ref 35.0–45.0)
HEMOGLOBIN: 14.7 g/dL (ref 11.7–15.5)
LYMPHS ABS: 1231 {cells}/uL (ref 850–3900)
MCH: 33.1 pg — AB (ref 27.0–33.0)
MCHC: 34.8 g/dL (ref 32.0–36.0)
MCV: 95 fL (ref 80.0–100.0)
MONOS PCT: 7.5 %
MPV: 10 fL (ref 7.5–12.5)
NEUTROS ABS: 3825 {cells}/uL (ref 1500–7800)
NEUTROS PCT: 67.1 %
Platelets: 258 10*3/uL (ref 140–400)
RBC: 4.44 10*6/uL (ref 3.80–5.10)
RDW: 11.3 % (ref 11.0–15.0)
Total Lymphocyte: 21.6 %
WBC mixed population: 428 cells/uL (ref 200–950)
WBC: 5.7 10*3/uL (ref 3.8–10.8)

## 2017-10-05 MED ORDER — FLUTICASONE PROPIONATE 50 MCG/ACT NA SUSP: NASAL | 5 refills | Status: DC

## 2017-10-05 MED ORDER — DOXYCYCLINE HYCLATE 100 MG PO TABS: 100.0000 mg | ORAL_TABLET | Freq: Two times a day (BID) | ORAL | 0 refills | Status: DC

## 2017-10-05 NOTE — Progress Notes (Signed)
Subjective:    Patient ID: [name], female    DOB: 1940-11-17, 77 y.o.   MRN: [id number]  HPI Patient is here today for complete physical exam. Patient's past medical history significant for breast cancer status post bilateral mastectomy. She states his been approximately 10 years since her last colonoscopy however given her age, she prefers not to have another colonoscopy at this time as she is asymptomatic. Due to her age, she does not require a Pap smear. Immunization records are listed below. She does have an erythematous rash on her chin. Rash consistent erythematous skin with 3-4 mm erythematous skin colored papules. It is limited to her lower left lip and the surrounding chin. She's been using triamcinolone for more than a week without any benefit. Rash appears to be perioral dermatitis Past Medical History:  Diagnosis Date  . Anemia   . Arthritis   . Breast cancer ([place])   . Depression   . Diverticulosis   . Lymphedema of left arm   . Osteoporosis   . Vitamin D deficiency    Past Surgical History:  Procedure Laterality Date  . ABDOMINAL HYSTERECTOMY    . APPENDECTOMY    . CHOLECYSTECTOMY    . MASTECTOMY    . TONSILLECTOMY     Current Outpatient Medications on File Prior to Visit  Medication Sig Dispense Refill  . albuterol (PROAIR HFA) 108 (90 Base) MCG/ACT inhaler Inhale 2 puffs into the lungs every 6 (six) hours as needed. 1 Inhaler 3  . albuterol (PROVENTIL) (2.5 MG/3ML) 0.083% nebulizer solution Take 3 mLs (2.5 mg total) by nebulization every 6 (six) hours as needed for wheezing or shortness of breath. 75 mL 1  . cetirizine (ZYRTEC) 10 MG tablet Take 1 tablet (10 mg total) by mouth daily. 30 tablet 11  . Cholecalciferol (VITAMIN D) 2000 UNITS tablet Take 2,000 Units by mouth daily. 09-25-12 pt was instructed to increase from 1000 to 2000 units daily    . diclofenac sodium (VOLTAREN) 1 % GEL APPLY 4 G TOPICALLY 4 TIMES DAILY 100 g 1  . escitalopram (LEXAPRO) 10 MG  tablet TAKE 1 TABLET BY MOUTH ONCE DAILY 30 tablet 2  . gabapentin (NEURONTIN) 100 MG capsule Take two (2) capsules by mouth at bed time. 180 capsule 3  . HYDROcodone-acetaminophen (NORCO/VICODIN) 5-325 MG tablet Take 1 tablet by mouth 2 (two) times daily as needed. 60 tablet 0  . omeprazole (PRILOSEC) 20 MG capsule TAKE ONE CAPSULE BY MOUTH TWICE DAILY 180 capsule 3   No current facility-administered medications on file prior to visit.    Allergies  Allergen Reactions  . Penicillins Hives   Social History   Socioeconomic History  . Marital status: Divorced    Spouse name: Not on file  . Number of children: Not on file  . Years of education: Not on file  . Highest education level: Not on file  Social Needs  . Financial resource strain: Not on file  . Food insecurity - worry: Not on file  . Food insecurity - inability: Not on file  . Transportation needs - medical: Not on file  . Transportation needs - non-medical: Not on file  Occupational History  . Not on file  Tobacco Use  . Smoking status: Never Smoker  . Smokeless tobacco: Never Used  . Tobacco comment: never used tobacco  Substance and Sexual Activity  . Alcohol use: No    Alcohol/week: 0.0 oz  . Drug use: No  . Sexual activity:  Not Currently  Other Topics Concern  . Not on file  Social History Narrative  . Not on file   No family history on file.    Review of Systems  All other systems reviewed and are negative.      Objective:   Physical Exam  Constitutional: She is oriented to person, place, and time. She appears well-developed and well-nourished. No distress.  HENT:  Head: Normocephalic and atraumatic.  Right Ear: External ear normal.  Left Ear: External ear normal.  Nose: Nose normal.  Mouth/Throat: Oropharynx is clear and moist. No oropharyngeal exudate.  Eyes: Conjunctivae and EOM are normal. Pupils are equal, round, and reactive to light. Right eye exhibits no discharge. Left eye exhibits no  discharge. No scleral icterus.  Neck: Normal range of motion. Neck supple. No JVD present. No tracheal deviation present. No thyromegaly present.  Cardiovascular: Normal rate, regular rhythm, normal heart sounds and intact distal pulses. Exam reveals no gallop and no friction rub.  No murmur heard. Pulmonary/Chest: Effort normal and breath sounds normal. No stridor. No respiratory distress. She has no wheezes. She has no rales. She exhibits no tenderness.  Abdominal: Soft. Bowel sounds are normal. She exhibits no distension and no mass. There is no tenderness. There is no rebound and no guarding.  Musculoskeletal: She exhibits edema.  Lymphadenopathy:    She has no cervical adenopathy.  Neurological: She is alert and oriented to person, place, and time. She has normal reflexes. She displays normal reflexes. No cranial nerve deficit. She exhibits normal muscle tone. Coordination normal.  Skin: Skin is warm. Rash noted. She is not diaphoretic. There is erythema.  Psychiatric: She has a normal mood and affect. Her behavior is normal. Judgment and thought content normal.  Vitals reviewed.         Assessment & Plan:  General medical exam - Plan: CBC with Differential/Platelet, COMPLETE METABOLIC PANEL WITH GFR, Lipid panel  Pure hypercholesterolemia - Plan: CBC with Differential/Platelet, COMPLETE METABOLIC PANEL WITH GFR, Lipid panel  Patient received Pneumovax 23 today in clinic. She declined a flu shot. She declined a tetanus shot. Patient no longer requires a mammogram and she has had a bilateral mastectomy. She is not due for a Pap smear given her advanced age. She declines a colonoscopy at the present time. Will check a CBC, CMP, fasting lipid panel. I will treat her perioral dermatitis with doxycycline 100 mg by mouth twice a day for 10 days. Will use Flonase for nasal congestion that she is complaining of. Offered her referral to the lymphedema clinic for the lymphedema in her left arm but  she declines this at this time.

## 2017-10-05 NOTE — Addendum Note (Signed)
Addended by: Shary Decamp B on: 10/05/2017 10:50 AM   Modules accepted: Orders

## 2017-10-06 ENCOUNTER — Encounter: Payer: Self-pay | Admitting: Family Medicine

## 2017-10-17 ENCOUNTER — Telehealth: Payer: Self-pay | Admitting: Family Medicine

## 2017-10-17 DIAGNOSIS — I890 Lymphedema, not elsewhere classified: Secondary | ICD-10-CM

## 2017-10-17 NOTE — Telephone Encounter (Signed)
Try desowen cream bid for 1 week and consult derm if no better.

## 2017-10-17 NOTE — Telephone Encounter (Signed)
Patient called [facility] stating that she has completed the antibx and her face is no better, in fact it is spreading and she would like to know what to do now?

## 2017-10-18 MED ORDER — DESONIDE 0.05 % EX CREA: TOPICAL_CREAM | Freq: Two times a day (BID) | CUTANEOUS | 0 refills | Status: DC

## 2017-10-18 NOTE — Telephone Encounter (Signed)
Patient aware of providers recommendations. Med sent to pharm. Pt wants to go to the Lymphadenic clinic you had mentioned to her in her lov because her arm is no better!?!

## 2017-10-19 NOTE — Telephone Encounter (Signed)
Can you set this appointment up for this pt - I could not find and actual referral order for this in Epic.

## 2017-10-19 NOTE — Telephone Encounter (Signed)
Ok consult lymphedema clinic.  I know there is one at [facility].

## 2017-10-24 ENCOUNTER — Encounter: Payer: Self-pay | Admitting: Family Medicine

## 2017-10-24 ENCOUNTER — Ambulatory Visit (INDEPENDENT_AMBULATORY_CARE_PROVIDER_SITE_OTHER): Payer: PPO | Admitting: Family Medicine

## 2017-10-24 VITALS — BP 110/76 | HR 70 | Temp 97.7°F | Resp 20 | Ht 64.0 in | Wt 220.0 lb

## 2017-10-24 DIAGNOSIS — G8929 Other chronic pain: Secondary | ICD-10-CM

## 2017-10-24 DIAGNOSIS — M25512 Pain in left shoulder: Secondary | ICD-10-CM | POA: Diagnosis not present

## 2017-10-24 DIAGNOSIS — I890 Lymphedema, not elsewhere classified: Secondary | ICD-10-CM

## 2017-10-24 NOTE — Progress Notes (Signed)
Subjective:    Patient ID: [name], female    DOB: 1940-10-22, 77 y.o.   MRN: [id number]  HPI  12/30/16 I see the patient very infrequently. She uses hydrocodone sparingly for osteoarthritic pain in multiple joints primarily in the joints of her hands including the DIP and PIP joints bilaterally as well as her knees and shoulders and lower back. She has a history of hiatal hernia and acid reflux and does not tolerate oral NSAIDs. She uses voltaren gel on her  hands and this seems to help. She has marketed and severe osteoarthritic deformities in the DIP and PIP joints on both hands. She uses the hydrocodone to help her sleep at night. She seldom takes this on a daily basis and uses it sparingly. She also presents with 2 months of pain in her right shoulder. It hurts to abduct her arm greater than 90. She reports pain with internal and external rotation. She has positive empty can sign and a positive Hawkins sign. She is requesting a cortisone injection into the subacromial space. She saw significant benefit when we performed this last year on her left shoulder.  At that time, my plan was: At the present time, the patient is using hydrocodone sparingly for the pain in her multiple joints primarily in her hands feet lower back and knees. I see no reason why she cannot continue to use the hydrocodone once a day at night to help her sleep. There is no evidence of abuse or diversion. Therefore I will gladly refill her medication. I believe she has impingement syndrome of the right shoulder possibly bursitis.  Using sterile technique, I injected the right shoulder with mixture of 2 mL of lidocaine, 2 mL of Marcaine, and 2 mL of 40 mg per mL Kenalog. The patient tolerated the procedure well without complication  11/30/74 Patient presents today for 2 concerns. #1 she would like a cortisone injection in her left shoulder. She saw substantial benefit from the recent injection we performed in her left  shoulder. She is requesting the same be performed in the right. She is. She had a cortisone injection in this joint and also saw substantial benefit from it. She has pain with abduction greater than 90. Pain lifting her arm above her head. Pain with internal and external rotation. She also has soreness in pain at night when she tries to sleep. She has a positive empty can sign and pain with abduction greater than 90. Second issue are skin tags on her neck per her report. There are 2 "skin tags. Both are located on the anterior neck near the thyroid gland. One is a well pedunculated skin tag approximately 4 mm in diameter. The second is a 1 cm cherry red nodular lesion with a hard keratinaceous core. 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 At that time, my plan was:  Using sterile technique, the left subacromial space was injected with a mixture of 2 mL of lidocaine, 2 mL of Marcaine, and 2 mL of 40 mg per mL Kenalog. Patient tolerated the procedure well without complication. Next I removed the 2 lesions on her anterior neck. The base of each lesion was anesthetized 0.1% lidocaine with epinephrine. Each lesion was then removed in its entirety using a razor to shave biopsy technique. Lesion #2 was sent to pathology and labeled container to rule out skin cancer. Hemostasis was achieved with Drysol and a Band-Aid. Await the pathology results.  10/24/17 Patient presents with very little range of motion in  her left shoulder.  She is unable to abduct the arm greater than 80 degrees.  There is audible crepitus in the left shoulder every time she raises or rotates her arm.  She reports pain in her shoulder with abduction greater than 80 degrees.  Injection provided very little relief.  Shoulder has gradually worsened over the last 7 months since I last saw her.  Roughly the same time, she noticed a significant increase in the amount of lymphedema in her left arm.  She has a history of a mastectomy with 17 lymph nodes being  removed from her left axilla when she was 77 years old.  She never had significant lymphedema until the last year.  However over the last year, as her left shoulder has become dependent leaving her arm in a dependent position, the lymphedema has significantly worsened.  In fact last fall, I had to treat her left arm with compressive wraps.  She is wearing her lymphedema sleeve at home and despite that, she has significant pitting edema distal to the elbow and the skin is actually weeping on the dorsum of her hand.  Due to the lymphedema, the left arm is getting progressively heavier which is created a snowball-like effect because she is unable to move her shoulder which makes her arm muscles even weaker causing her arm to become progressively more dependent and incapacitated. Past Medical History:  Diagnosis Date  . Anemia   . Arthritis   . Breast cancer ([place])   . Depression   . Diverticulosis   . Lymphedema of left arm   . Osteoporosis   . Vitamin D deficiency    Past Surgical History:  Procedure Laterality Date  . ABDOMINAL HYSTERECTOMY    . APPENDECTOMY    . CHOLECYSTECTOMY    . MASTECTOMY    . TONSILLECTOMY     Current Outpatient Medications on File Prior to Visit  Medication Sig Dispense Refill  . albuterol (PROAIR HFA) 108 (90 Base) MCG/ACT inhaler Inhale 2 puffs into the lungs every 6 (six) hours as needed. 1 Inhaler 3  . albuterol (PROVENTIL) (2.5 MG/3ML) 0.083% nebulizer solution Take 3 mLs (2.5 mg total) by nebulization every 6 (six) hours as needed for wheezing or shortness of breath. 75 mL 1  . cetirizine (ZYRTEC) 10 MG tablet Take 1 tablet (10 mg total) by mouth daily. 30 tablet 11  . Cholecalciferol (VITAMIN D) 2000 UNITS tablet Take 2,000 Units by mouth daily. 09-25-12 pt was instructed to increase from 1000 to 2000 units daily    . desonide (DESOWEN) 0.05 % cream Apply topically 2 (two) times daily. 30 g 0  . diclofenac sodium (VOLTAREN) 1 % GEL APPLY 4 G TOPICALLY 4 TIMES  DAILY 100 g 1  . doxycycline (VIBRA-TABS) 100 MG tablet Take 1 tablet (100 mg total) by mouth 2 (two) times daily. 20 tablet 0  . escitalopram (LEXAPRO) 10 MG tablet TAKE 1 TABLET BY MOUTH ONCE DAILY 30 tablet 2  . fluticasone (FLONASE) 50 MCG/ACT nasal spray 2 sprays per nostril daily.please dispense 15.47ml 16 g 5  . gabapentin (NEURONTIN) 100 MG capsule Take two (2) capsules by mouth at bed time. 180 capsule 3  . HYDROcodone-acetaminophen (NORCO/VICODIN) 5-325 MG tablet Take 1 tablet by mouth 2 (two) times daily as needed. 60 tablet 0  . omeprazole (PRILOSEC) 20 MG capsule TAKE ONE CAPSULE BY MOUTH TWICE DAILY 180 capsule 3   No current facility-administered medications on file prior to visit.    Allergies  Allergen Reactions  . Penicillins Hives   Social History   Socioeconomic History  . Marital status: Divorced    Spouse name: Not on file  . Number of children: Not on file  . Years of education: Not on file  . Highest education level: Not on file  Occupational History  . Not on file  Social Needs  . Financial resource strain: Not on file  . Food insecurity:    Worry: Not on file    Inability: Not on file  . Transportation needs:    Medical: Not on file    Non-medical: Not on file  Tobacco Use  . Smoking status: Never Smoker  . Smokeless tobacco: Never Used  . Tobacco comment: never used tobacco  Substance and Sexual Activity  . Alcohol use: No    Alcohol/week: 0.0 oz  . Drug use: No  . Sexual activity: Not Currently  Lifestyle  . Physical activity:    Days per week: Not on file    Minutes per session: Not on file  . Stress: Not on file  Relationships  . Social connections:    Talks on phone: Not on file    Gets together: Not on file    Attends religious service: Not on file    Active member of club or organization: Not on file    Attends meetings of clubs or organizations: Not on file    Relationship status: Not on file  . Intimate partner violence:    Fear  of current or ex partner: Not on file    Emotionally abused: Not on file    Physically abused: Not on file    Forced sexual activity: Not on file  Other Topics Concern  . Not on file  Social History Narrative  . Not on file      Review of Systems  All other systems reviewed and are negative.      Objective:   Physical Exam  Constitutional: She appears well-developed and well-nourished.  Cardiovascular: Normal rate, regular rhythm and normal heart sounds.  No murmur heard. Pulmonary/Chest: Effort normal and breath sounds normal. No respiratory distress. She has no wheezes. She has no rales.  Abdominal: Soft. Bowel sounds are normal. She exhibits no distension. There is no tenderness. There is no rebound.  Musculoskeletal:       Left shoulder: She exhibits decreased range of motion, tenderness, pain and decreased strength.   Unable to abduct left shoulder greater than 80 degrees.  Shoulder pops and clicks every time she tries to raise her arm or across her body.  This causes mild pain in her left shoulder. Significant lymphedema in the left arm particularly distal to the left elbow      Assessment & Plan:  Lymphedema of left arm  Chronic left shoulder pain  I truly believe the patient has lymphedema in her left arm however it has gotten significantly worse over the last 6 or 7 months.  This is also the period of time that her left shoulder has gotten progressively worse and has become dependent and incapacitated.  Therefore I believe she has dependent edema in the left arm due to her inability to raise her left arm and this is exacerbating the lymphedema in the left arm causing the weeping edema seen today on exam.  Therefore I placed the patient in compressive wraps up to the level of her elbow to help treat the swelling in her left arm.  I have recommended an ultrasound of  the left upper extremity to rule out a DVT just given the fact this is only recently begun in the last year  despite the fact that her lymph node dissection was when she was 77 years old just to rule out venous occlusion.  I have recommended an x-ray of the left shoulder given the worsening pain in her left arm.  Once DVT is ruled out, I would recommend a lymphedema clinic to help the patient manage the lymphedema in her left arm.  If the x-ray shows no significant abnormalities, I will recommend physical therapy and orthopedics consultation for the left shoulder pain.

## 2017-10-25 ENCOUNTER — Ambulatory Visit
Admission: RE | Admit: 2017-10-25 | Discharge: 2017-10-25 | Disposition: A | Discharge status: Home / Self Care | Payer: PPO | Source: Ambulatory Visit | Attending: Family Medicine | Admitting: Family Medicine

## 2017-10-25 DIAGNOSIS — M19012 Primary osteoarthritis, left shoulder: Secondary | ICD-10-CM | POA: Diagnosis not present

## 2017-10-25 DIAGNOSIS — G8929 Other chronic pain: Secondary | ICD-10-CM

## 2017-10-25 DIAGNOSIS — M25512 Pain in left shoulder: Principal | ICD-10-CM

## 2017-10-26 ENCOUNTER — Ambulatory Visit (INDEPENDENT_AMBULATORY_CARE_PROVIDER_SITE_OTHER): Payer: PPO | Admitting: Family Medicine

## 2017-10-26 ENCOUNTER — Encounter: Payer: Self-pay | Admitting: Family Medicine

## 2017-10-26 VITALS — BP 130/70 | HR 60 | Temp 97.8°F | Resp 20 | Ht 64.0 in | Wt 220.0 lb

## 2017-10-26 DIAGNOSIS — G8929 Other chronic pain: Secondary | ICD-10-CM | POA: Diagnosis not present

## 2017-10-26 DIAGNOSIS — I890 Lymphedema, not elsewhere classified: Secondary | ICD-10-CM

## 2017-10-26 DIAGNOSIS — M25512 Pain in left shoulder: Secondary | ICD-10-CM

## 2017-10-26 NOTE — Progress Notes (Signed)
Subjective:    Patient ID: [name], female    DOB: 05/19/1941, 77 y.o.   MRN: [id number]  HPI  12/30/16 I see the patient very infrequently. She uses hydrocodone sparingly for osteoarthritic pain in multiple joints primarily in the joints of her hands including the DIP and PIP joints bilaterally as well as her knees and shoulders and lower back. She has a history of hiatal hernia and acid reflux and does not tolerate oral NSAIDs. She uses voltaren gel on her  hands and this seems to help. She has marketed and severe osteoarthritic deformities in the DIP and PIP joints on both hands. She uses the hydrocodone to help her sleep at night. She seldom takes this on a daily basis and uses it sparingly. She also presents with 2 months of pain in her right shoulder. It hurts to abduct her arm greater than 90. She reports pain with internal and external rotation. She has positive empty can sign and a positive Hawkins sign. She is requesting a cortisone injection into the subacromial space. She saw significant benefit when we performed this last year on her left shoulder.  At that time, my plan was: At the present time, the patient is using hydrocodone sparingly for the pain in her multiple joints primarily in her hands feet lower back and knees. I see no reason why she cannot continue to use the hydrocodone once a day at night to help her sleep. There is no evidence of abuse or diversion. Therefore I will gladly refill her medication. I believe she has impingement syndrome of the right shoulder possibly bursitis.  Using sterile technique, I injected the right shoulder with mixture of 2 mL of lidocaine, 2 mL of Marcaine, and 2 mL of 40 mg per mL Kenalog. The patient tolerated the procedure well without complication  04/03/89 Patient presents today for 2 concerns. #1 she would like a cortisone injection in her left shoulder. She saw substantial benefit from the recent injection we performed in her left  shoulder. She is requesting the same be performed in the right. She is. She had a cortisone injection in this joint and also saw substantial benefit from it. She has pain with abduction greater than 90. Pain lifting her arm above her head. Pain with internal and external rotation. She also has soreness in pain at night when she tries to sleep. She has a positive empty can sign and pain with abduction greater than 90. Second issue are skin tags on her neck per her report. There are 2 "skin tags. Both are located on the anterior neck near the thyroid gland. One is a well pedunculated skin tag approximately 4 mm in diameter. The second is a 1 cm cherry red nodular lesion with a hard keratinaceous core. 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 At that time, my plan was:  Using sterile technique, the left subacromial space was injected with a mixture of 2 mL of lidocaine, 2 mL of Marcaine, and 2 mL of 40 mg per mL Kenalog. Patient tolerated the procedure well without complication. Next I removed the 2 lesions on her anterior neck. The base of each lesion was anesthetized 0.1% lidocaine with epinephrine. Each lesion was then removed in its entirety using a razor to shave biopsy technique. Lesion #2 was sent to pathology and labeled container to rule out skin cancer. Hemostasis was achieved with Drysol and a Band-Aid. Await the pathology results.  10/24/17 Patient presents with very little range of motion in  her left shoulder.  She is unable to abduct the arm greater than 80 degrees.  There is audible crepitus in the left shoulder every time she raises or rotates her arm.  She reports pain in her shoulder with abduction greater than 80 degrees.  Injection provided very little relief.  Shoulder has gradually worsened over the last 7 months since I last saw her.  Roughly the same time, she noticed a significant increase in the amount of lymphedema in her left arm.  She has a history of a mastectomy with 17 lymph nodes being  removed from her left axilla when she was 77 years old.  She never had significant lymphedema until the last year.  However over the last year, as her left shoulder has become dependent leaving her arm in a dependent position, the lymphedema has significantly worsened.  In fact last fall, I had to treat her left arm with compressive wraps.  She is wearing her lymphedema sleeve at home and despite that, she has significant pitting edema distal to the elbow and the skin is actually weeping on the dorsum of her hand.  Due to the lymphedema, the left arm is getting progressively heavier which is created a snowball-like effect because she is unable to move her shoulder which makes her arm muscles even weaker causing her arm to become progressively more dependent and incapacitated.  At that time, my plan was: I truly believe the patient has lymphedema in her left arm however it has gotten significantly worse over the last 6 or 7 months.  This is also the period of time that her left shoulder has gotten progressively worse and has become dependent and incapacitated.  Therefore I believe she has dependent edema in the left arm due to her inability to raise her left arm and this is exacerbating the lymphedema in the left arm causing the weeping edema seen today on exam.  Therefore I placed the patient in compressive wraps up to the level of her elbow to help treat the swelling in her left arm.  I have recommended an ultrasound of the left upper extremity to rule out a DVT just given the fact this is only recently begun in the last year despite the fact that her lymph node dissection was when she was 77 years old just to rule out venous occlusion.  I have recommended an x-ray of the left shoulder given the worsening pain in her left arm.  Once DVT is ruled out, I would recommend a lymphedema clinic to help the patient manage the lymphedema in her left arm.  If the x-ray shows no significant abnormalities, I will recommend  physical therapy and orthopedics consultation for the left shoulder pain.  10/26/17 Xrays reveal: Degenerative changes of the glenohumeral articulation and acromioclavicular joint are seen. No acute fracture or dislocation is seen. The underlying bony thorax is within normal limits. The swelling in the patient's arm has improved dramatically after wearing the compression wrap I placed on the arm 2 days ago.  The skin is no longer weeping edema.  I removed the compression wrap/Unna boot in place the patient back in her compressive sleeve that she can now wear easily due to the lessening edema.  X-ray revealed degenerative joint disease.  I believe that she is dealing with dependent edema due to the fact she is unable to lift her arm over her head because of the shoulder problems.  She has not responded to cortisone injections.  I believe she  needs to see orthopedic surgery  Past Medical History:  Diagnosis Date  . Anemia   . Arthritis   . Breast cancer ([place])   . Depression   . Diverticulosis   . Lymphedema of left arm   . Osteoporosis   . Vitamin D deficiency    Past Surgical History:  Procedure Laterality Date  . ABDOMINAL HYSTERECTOMY    . APPENDECTOMY    . CHOLECYSTECTOMY    . MASTECTOMY    . TONSILLECTOMY     Current Outpatient Medications on File Prior to Visit  Medication Sig Dispense Refill  . albuterol (PROAIR HFA) 108 (90 Base) MCG/ACT inhaler Inhale 2 puffs into the lungs every 6 (six) hours as needed. 1 Inhaler 3  . albuterol (PROVENTIL) (2.5 MG/3ML) 0.083% nebulizer solution Take 3 mLs (2.5 mg total) by nebulization every 6 (six) hours as needed for wheezing or shortness of breath. 75 mL 1  . cetirizine (ZYRTEC) 10 MG tablet Take 1 tablet (10 mg total) by mouth daily. 30 tablet 11  . Cholecalciferol (VITAMIN D) 2000 UNITS tablet Take 2,000 Units by mouth daily. 09-25-12 pt was instructed to increase from 1000 to 2000 units daily    . desonide (DESOWEN) 0.05 % cream Apply  topically 2 (two) times daily. 30 g 0  . diclofenac sodium (VOLTAREN) 1 % GEL APPLY 4 G TOPICALLY 4 TIMES DAILY 100 g 1  . doxycycline (VIBRA-TABS) 100 MG tablet Take 1 tablet (100 mg total) by mouth 2 (two) times daily. 20 tablet 0  . escitalopram (LEXAPRO) 10 MG tablet TAKE 1 TABLET BY MOUTH ONCE DAILY 30 tablet 2  . fluticasone (FLONASE) 50 MCG/ACT nasal spray 2 sprays per nostril daily.please dispense 15.86ml 16 g 5  . gabapentin (NEURONTIN) 100 MG capsule Take two (2) capsules by mouth at bed time. 180 capsule 3  . HYDROcodone-acetaminophen (NORCO/VICODIN) 5-325 MG tablet Take 1 tablet by mouth 2 (two) times daily as needed. 60 tablet 0  . omeprazole (PRILOSEC) 20 MG capsule TAKE ONE CAPSULE BY MOUTH TWICE DAILY 180 capsule 3   No current facility-administered medications on file prior to visit.    Allergies  Allergen Reactions  . Penicillins Hives   Social History   Socioeconomic History  . Marital status: Divorced    Spouse name: Not on file  . Number of children: Not on file  . Years of education: Not on file  . Highest education level: Not on file  Occupational History  . Not on file  Social Needs  . Financial resource strain: Not on file  . Food insecurity:    Worry: Not on file    Inability: Not on file  . Transportation needs:    Medical: Not on file    Non-medical: Not on file  Tobacco Use  . Smoking status: Never Smoker  . Smokeless tobacco: Never Used  . Tobacco comment: never used tobacco  Substance and Sexual Activity  . Alcohol use: No    Alcohol/week: 0.0 oz  . Drug use: No  . Sexual activity: Not Currently  Lifestyle  . Physical activity:    Days per week: Not on file    Minutes per session: Not on file  . Stress: Not on file  Relationships  . Social connections:    Talks on phone: Not on file    Gets together: Not on file    Attends religious service: Not on file    Active member of club or organization: Not on file  Attends meetings of clubs  or organizations: Not on file    Relationship status: Not on file  . Intimate partner violence:    Fear of current or ex partner: Not on file    Emotionally abused: Not on file    Physically abused: Not on file    Forced sexual activity: Not on file  Other Topics Concern  . Not on file  Social History Narrative  . Not on file      Review of Systems  All other systems reviewed and are negative.      Objective:   Physical Exam  Constitutional: She appears well-developed and well-nourished.  Cardiovascular: Normal rate, regular rhythm and normal heart sounds.  No murmur heard. Pulmonary/Chest: Effort normal and breath sounds normal. No respiratory distress. She has no wheezes. She has no rales.  Abdominal: Soft. Bowel sounds are normal. She exhibits no distension. There is no tenderness. There is no rebound.  Musculoskeletal:       Left shoulder: She exhibits decreased range of motion, tenderness, pain and decreased strength.   Unable to abduct left shoulder greater than 80 degrees.  Shoulder pops and clicks every time she tries to raise her arm or across her body.  This causes mild pain in her left shoulder. Lymphedema is improved in the left arm dramatically.  Patient is placed back in her compression sleeve      Assessment & Plan:  Lymphedema of left arm  Chronic left shoulder pain - Plan: Ambulatory referral to Orthopedic Surgery  Patient is placed back in her compression sleeve and the Unna boot is removed from her left arm.  She has an appointment on April 10 to see the lymphedema clinic at [facility] to discuss better long-term options that can more effectively control the swelling.  However the majority of the swelling is due to dependent edema because of her inability to move her left arm due to shoulder pain.  Therefore I will consult orthopedic surgery.  I believe she would be a good candidate for physical therapy and glenohumeral joint injections but I will defer to  their expertise

## 2017-10-26 NOTE — Telephone Encounter (Signed)
I put the patient's referral in under General surgery because she will see a General Surgeon at [facility] that see's patient's for lymphedema. Patient is scheduled for 11/08/17 at 11:30.  with Dr.Myron Florene Glen at [facility] in the [place] tower on the 5th floor. Appointment information given to [facility] to give patient whom is scheduled today.

## 2017-11-01 ENCOUNTER — Encounter (HOSPITAL_COMMUNITY): Payer: PPO

## 2017-11-06 ENCOUNTER — Other Ambulatory Visit: Payer: Self-pay | Admitting: Family Medicine

## 2017-11-06 ENCOUNTER — Other Ambulatory Visit: Payer: Self-pay | Admitting: Hematology & Oncology

## 2017-11-06 MED ORDER — HYDROCODONE-ACETAMINOPHEN 5-325 MG PO TABS: 1.0000 | ORAL_TABLET | Freq: Two times a day (BID) | ORAL | 0 refills | Status: DC | PRN

## 2017-11-06 NOTE — Telephone Encounter (Signed)
Pt needs refill on hydrocodone to [facility].

## 2017-11-06 NOTE — Telephone Encounter (Signed)
Patient is requesting a refill on Hydrocodone   LOV: 10/26/17  LRF:   09/28/17

## 2017-11-08 DIAGNOSIS — I890 Lymphedema, not elsewhere classified: Secondary | ICD-10-CM | POA: Diagnosis not present

## 2017-11-13 DIAGNOSIS — H40013 Open angle with borderline findings, low risk, bilateral: Secondary | ICD-10-CM | POA: Diagnosis not present

## 2017-11-13 DIAGNOSIS — H35033 Hypertensive retinopathy, bilateral: Secondary | ICD-10-CM | POA: Diagnosis not present

## 2017-11-13 DIAGNOSIS — Z961 Presence of intraocular lens: Secondary | ICD-10-CM | POA: Diagnosis not present

## 2017-11-13 DIAGNOSIS — H04223 Epiphora due to insufficient drainage, bilateral lacrimal glands: Secondary | ICD-10-CM | POA: Diagnosis not present

## 2017-11-23 DIAGNOSIS — M25512 Pain in left shoulder: Secondary | ICD-10-CM | POA: Diagnosis not present

## 2017-11-23 DIAGNOSIS — M25511 Pain in right shoulder: Secondary | ICD-10-CM | POA: Diagnosis not present

## 2017-11-24 ENCOUNTER — Other Ambulatory Visit: Payer: Self-pay | Admitting: Orthopedic Surgery

## 2017-11-24 DIAGNOSIS — M25512 Pain in left shoulder: Secondary | ICD-10-CM

## 2017-11-27 ENCOUNTER — Ambulatory Visit
Admission: RE | Admit: 2017-11-27 | Discharge: 2017-11-27 | Disposition: A | Discharge status: Home / Self Care | Payer: PPO | Source: Ambulatory Visit | Attending: Orthopedic Surgery | Admitting: Orthopedic Surgery

## 2017-11-27 DIAGNOSIS — M25512 Pain in left shoulder: Secondary | ICD-10-CM

## 2017-11-27 DIAGNOSIS — M19012 Primary osteoarthritis, left shoulder: Secondary | ICD-10-CM | POA: Diagnosis not present

## 2017-12-04 ENCOUNTER — Encounter: Payer: Self-pay | Admitting: Physician Assistant

## 2017-12-04 ENCOUNTER — Ambulatory Visit (INDEPENDENT_AMBULATORY_CARE_PROVIDER_SITE_OTHER): Payer: PPO | Admitting: Physician Assistant

## 2017-12-04 ENCOUNTER — Other Ambulatory Visit: Payer: Self-pay

## 2017-12-04 VITALS — BP 132/74 | HR 62 | Temp 97.5°F | Resp 18 | Ht 66.0 in | Wt 214.6 lb

## 2017-12-04 DIAGNOSIS — L239 Allergic contact dermatitis, unspecified cause: Secondary | ICD-10-CM | POA: Diagnosis not present

## 2017-12-04 MED ORDER — PREDNISONE 20 MG PO TABS: ORAL_TABLET | ORAL | 0 refills | Status: DC

## 2017-12-04 NOTE — Progress Notes (Signed)
Patient ID: [name] MRN: [id number], DOB: 1941/06/04, 77 y.o. Date of Encounter: 12/04/2017, 10:09 AM    Chief Complaint:  Chief Complaint  Patient presents with  . rash on face and neck    itching  symptoms for about 48month      HPI: 77 y.o. year old female presents with above.   She reports that when she was here for an office visit with Dr. Dennard Schaumann regarding other issues in the past, that at that time is when she started having this rash on her face and she had shown him the rash.  Says that he initially prescribed an oral medication for her to take but that did not help so she called and he had her use a cream but the rash still did not resolve so he then planned for her to follow-up with dermatology.  However states that the earliest dermatology appointment she could get is May 22 with Dr. Denna Haggard.  She reports that she has been taking oral Benadryl "around-the-clock "and applying Benadryl cream. The rash is itching really bad so she came in for visit today.  Today I did review that at her CPE with Dr. Dennard Schaumann on 10/05/2017-- he did prescribe doxycycline 100 mg twice daily x10 days.  Today I asked patient if she is applying any type of skin care products to her face.  Asked about any type of moisturizers or make-ups etc.  She states that she puts coconut oil up around her forehead and that area but has been putting nothing on the lower half of her face---except for the medications.     Home Meds:   Outpatient Medications Prior to Visit  Medication Sig Dispense Refill  . albuterol (PROAIR HFA) 108 (90 Base) MCG/ACT inhaler Inhale 2 puffs into the lungs every 6 (six) hours as needed. 1 Inhaler 3  . albuterol (PROVENTIL) (2.5 MG/3ML) 0.083% nebulizer solution Take 3 mLs (2.5 mg total) by nebulization every 6 (six) hours as needed for wheezing or shortness of breath. 75 mL 1  . cetirizine (ZYRTEC) 10 MG tablet Take 1 tablet (10 mg total) by mouth daily. 30 tablet 11  .  Cholecalciferol (VITAMIN D) 2000 UNITS tablet Take 2,000 Units by mouth daily. 09-25-12 pt was instructed to increase from 1000 to 2000 units daily    . desonide (DESOWEN) 0.05 % cream Apply topically 2 (two) times daily. 30 g 0  . diclofenac sodium (VOLTAREN) 1 % GEL APPLY 4 G TOPICALLY 4 TIMES DAILY 100 g 1  . escitalopram (LEXAPRO) 10 MG tablet TAKE 1 TABLET BY MOUTH ONCE DAILY 30 tablet 2  . fluticasone (FLONASE) 50 MCG/ACT nasal spray 2 sprays per nostril daily.please dispense 15.60ml 16 g 5  . gabapentin (NEURONTIN) 100 MG capsule Take two (2) capsules by mouth at bed time. (Patient taking differently: daily as needed. Take two (2) capsules by mouth at bed time.) 180 capsule 3  . HYDROcodone-acetaminophen (NORCO/VICODIN) 5-325 MG tablet Take 1 tablet by mouth 2 (two) times daily as needed. 60 tablet 0  . omeprazole (PRILOSEC) 20 MG capsule TAKE ONE CAPSULE BY MOUTH TWICE DAILY 180 capsule 3   No facility-administered medications prior to visit.     Allergies:  Allergies  Allergen Reactions  . Penicillins Hives      Review of Systems: See HPI for pertinent ROS. All other ROS negative.    Physical Exam: Blood pressure 132/74, pulse 62, temperature (!) 97.5 F (36.4 C), temperature source Oral, resp. rate  18, height 5\' 6"  (1.676 m), weight 97.3 kg (214 lb 9.6 oz), SpO2 94 %., Body mass index is 34.64 kg/m. General:  WF. Appears in no acute distress. Neck: Supple. No thyromegaly. No lymphadenopathy. Lungs: Clear bilaterally to auscultation without wheezes, rales, or rhonchi. Breathing is unlabored. Heart: Regular rhythm. No murmurs, rubs, or gallops. Msk:  Strength and tone normal for age. Skin: Face: There is small "splotch" of erythema --just adjacent to left side of nose--and just below left nares (each of these "splotches" measures approx 1cm x 0.5 cm.   There is erythema just below middle of lower lip.  There is erythema extending downward from the edge of lip bilaterally.    Remainder of chin is normal with no rash. Anterior neck -- covered with diffuse erythema--well demarcated edges. Measures approx 3 inch x 3 inch of diffuse erythema on anterior neck. Neuro: Alert and oriented X 3. Moves all extremities spontaneously. Gait is normal. CNII-XII grossly in tact. Psych:  Responds to questions appropriately with a normal affect.     ASSESSMENT AND PLAN:  77 y.o. year old female with  1. Allergic dermatitis -Appears consistent with an allergic dermatitis.  I recommend that she take oral prednisone taper.  Also continue the oral Benadryl.  Recommend that she apply absolutely nothing to the skin as I do not know what is causing the allergic dermatitis and to avoid any allergens.  Follow-up with me if the rash does not resolve after completing prednisone taper. - predniSONE (DELTASONE) 20 MG tablet; Take 3 daily for 2 days, then 2 daily for 2 days, then 1 daily for 2 days.  Dispense: 12 tablet; Refill: 0   Signed, [address] [place], [place], [facility] 12/04/2017 10:09 AM

## 2017-12-06 DIAGNOSIS — M75122 Complete rotator cuff tear or rupture of left shoulder, not specified as traumatic: Secondary | ICD-10-CM | POA: Diagnosis not present

## 2017-12-06 DIAGNOSIS — M25512 Pain in left shoulder: Secondary | ICD-10-CM | POA: Diagnosis not present

## 2017-12-06 DIAGNOSIS — M19012 Primary osteoarthritis, left shoulder: Secondary | ICD-10-CM | POA: Diagnosis not present

## 2017-12-11 ENCOUNTER — Telehealth: Payer: Self-pay

## 2017-12-11 NOTE — Telephone Encounter (Signed)
I recommend that she stop Zyrtec and replace that with Benadryl.  Take Benadryl regularly around-the-clock.  Follow directions on the package.  Keep the appointment with dermatology, but would also go ahead and put in a referral to the allergy specialist. Place order for [facility] to [facility].

## 2017-12-11 NOTE — Telephone Encounter (Signed)
Call placed to patient [facility] 

## 2017-12-11 NOTE — Telephone Encounter (Signed)
Patient was seen in the office on 12/04/2017 for allergic dermatitis. Patient states when she was taking prednisone that her rash and itching was clearing up on her face, but once she completed the medication the itching started back up and her face started to turn red again and the redness and itching is worse under her nose.  Patient has an appointment with a Dermatology on 5/22 patient is asking if something can be called in to help with the itching and redness until her appointment. Pls advise

## 2017-12-12 NOTE — Telephone Encounter (Signed)
Patient states she will hold off on seeing a allergy specialist for now just because she will be having shoulder surgery and is not sure of the date and would like to see what the dermatologist has to say. Patient was instructed to continue to use benadryl and to follow up with dermatology.Patient verbalizes understanding

## 2017-12-18 ENCOUNTER — Other Ambulatory Visit: Payer: Self-pay | Admitting: Family Medicine

## 2017-12-18 MED ORDER — HYDROCODONE-ACETAMINOPHEN 5-325 MG PO TABS: 1.0000 | ORAL_TABLET | Freq: Two times a day (BID) | ORAL | 0 refills | Status: DC | PRN

## 2017-12-18 NOTE — Telephone Encounter (Signed)
Patient is calling to refill on her hydrocodone  [facility]

## 2017-12-18 NOTE — Telephone Encounter (Signed)
Ok to refill??  Last office visit 12/04/2017.  Last refill 11/06/2017.

## 2017-12-20 DIAGNOSIS — L603 Nail dystrophy: Secondary | ICD-10-CM | POA: Diagnosis not present

## 2017-12-20 DIAGNOSIS — B359 Dermatophytosis, unspecified: Secondary | ICD-10-CM | POA: Diagnosis not present

## 2017-12-20 DIAGNOSIS — B358 Other dermatophytoses: Secondary | ICD-10-CM | POA: Diagnosis not present

## 2017-12-22 ENCOUNTER — Encounter: Payer: Self-pay | Admitting: Hematology & Oncology

## 2017-12-22 ENCOUNTER — Inpatient Hospital Stay (HOSPITAL_BASED_OUTPATIENT_CLINIC_OR_DEPARTMENT_OTHER): Payer: PPO | Admitting: Hematology & Oncology

## 2017-12-22 ENCOUNTER — Other Ambulatory Visit: Payer: Self-pay

## 2017-12-22 ENCOUNTER — Inpatient Hospital Stay: Payer: PPO | Attending: Hematology & Oncology

## 2017-12-22 VITALS — BP 140/92 | HR 71 | Temp 97.9°F | Resp 18 | Wt 214.0 lb

## 2017-12-22 DIAGNOSIS — Z79899 Other long term (current) drug therapy: Secondary | ICD-10-CM | POA: Diagnosis not present

## 2017-12-22 DIAGNOSIS — Z7952 Long term (current) use of systemic steroids: Secondary | ICD-10-CM | POA: Insufficient documentation

## 2017-12-22 DIAGNOSIS — C50911 Malignant neoplasm of unspecified site of right female breast: Secondary | ICD-10-CM

## 2017-12-22 DIAGNOSIS — Z9013 Acquired absence of bilateral breasts and nipples: Secondary | ICD-10-CM | POA: Insufficient documentation

## 2017-12-22 DIAGNOSIS — Z853 Personal history of malignant neoplasm of breast: Secondary | ICD-10-CM | POA: Diagnosis not present

## 2017-12-22 DIAGNOSIS — Z9221 Personal history of antineoplastic chemotherapy: Secondary | ICD-10-CM

## 2017-12-22 DIAGNOSIS — M40209 Unspecified kyphosis, site unspecified: Secondary | ICD-10-CM

## 2017-12-22 DIAGNOSIS — C779 Secondary and unspecified malignant neoplasm of lymph node, unspecified: Secondary | ICD-10-CM

## 2017-12-22 DIAGNOSIS — M19012 Primary osteoarthritis, left shoulder: Secondary | ICD-10-CM

## 2017-12-22 DIAGNOSIS — I972 Postmastectomy lymphedema syndrome: Secondary | ICD-10-CM | POA: Diagnosis not present

## 2017-12-22 LAB — CBC WITH DIFFERENTIAL (CANCER CENTER ONLY)
Basophils Absolute: 0 10*3/uL (ref 0.0–0.1)
Basophils Relative: 0 %
EOS ABS: 0.1 10*3/uL (ref 0.0–0.5)
EOS PCT: 2 %
HCT: 46.4 % (ref 34.8–46.6)
Hemoglobin: 15.6 g/dL (ref 11.6–15.9)
LYMPHS ABS: 1.7 10*3/uL (ref 0.9–3.3)
LYMPHS PCT: 19 %
MCH: 33.4 pg (ref 26.0–34.0)
MCHC: 33.6 g/dL (ref 32.0–36.0)
MCV: 99.4 fL (ref 81.0–101.0)
MONO ABS: 0.6 10*3/uL (ref 0.1–0.9)
Monocytes Relative: 7 %
Neutro Abs: 6.6 10*3/uL — ABNORMAL HIGH (ref 1.5–6.5)
Neutrophils Relative %: 72 %
PLATELETS: 198 10*3/uL (ref 145–400)
RBC: 4.67 MIL/uL (ref 3.70–5.32)
RDW: 12.7 % (ref 11.1–15.7)
WBC: 9.1 10*3/uL (ref 3.9–10.0)

## 2017-12-22 LAB — CMP (CANCER CENTER ONLY)
ALT: 18 U/L (ref 0–55)
ANION GAP: 10 (ref 3–11)
AST: 24 U/L (ref 5–34)
Albumin: 3.6 g/dL (ref 3.5–5.0)
Alkaline Phosphatase: 62 U/L (ref 40–150)
BUN: 9 mg/dL (ref 7–26)
CHLORIDE: 102 mmol/L (ref 98–109)
CO2: 27 mmol/L (ref 22–29)
Calcium: 9.5 mg/dL (ref 8.4–10.4)
Creatinine: 0.73 mg/dL (ref 0.60–1.10)
Glucose, Bld: 99 mg/dL (ref 70–140)
Potassium: 4.1 mmol/L (ref 3.5–5.1)
SODIUM: 139 mmol/L (ref 136–145)
Total Bilirubin: 0.8 mg/dL (ref 0.2–1.2)
Total Protein: 6.7 g/dL (ref 6.4–8.3)

## 2017-12-22 NOTE — Progress Notes (Signed)
Hematology and Oncology Follow Up Visit  [name] [id number] Mar 03, 1941 77 y.o. 12/22/2017   Principle Diagnosis:  Stage IIB (T2 N1 M0) ductal carcinoma of the right breast.  Current Therapy:    Observation     Interim History:  Ms.  Figueroa is comes in for followup. We last saw her a year ago. She's doing well.   There is been a lot of difficulties in her family.  Thankfully, the good [name] has helped.  It is amazing what [name] has done.  She is doing well healthwise.  She is had no problems since we saw her a year ago.  She has had no nausea or vomiting.  She is had no cough.  She is had no rashes.  She is had no fever.  There is been no change in bowel or bladder habits.  She has had no cough or shortness of breath.  She has had no leg swelling.  The other one problem is that she needs left shoulder repair.  She has severe osteoarthritis in the left shoulder.  She cannot lift up her left arm.  She will have shoulder replacement surgery this summer.  This probably will help with her developing lymphedema.  Overall, her performance status is ECOG 2.  Medications:  Current Outpatient Medications:  .  omeprazole (PRILOSEC) 10 MG capsule, Take 10 mg by mouth., Disp: , Rfl:  .  albuterol (PROAIR HFA) 108 (90 Base) MCG/ACT inhaler, Inhale 2 puffs into the lungs every 6 (six) hours as needed., Disp: 1 Inhaler, Rfl: 3 .  albuterol (PROVENTIL) (2.5 MG/3ML) 0.083% nebulizer solution, Take 3 mLs (2.5 mg total) by nebulization every 6 (six) hours as needed for wheezing or shortness of breath., Disp: 75 mL, Rfl: 1 .  cetirizine (ZYRTEC) 10 MG tablet, Take 1 tablet (10 mg total) by mouth daily., Disp: 30 tablet, Rfl: 11 .  Cholecalciferol (VITAMIN D) 2000 UNITS tablet, Take 2,000 Units by mouth daily. 09-25-12 pt was instructed to increase from 1000 to 2000 units daily, Disp: , Rfl:  .  desonide (DESOWEN) 0.05 % cream, Apply topically 2 (two) times daily., Disp: 30 g, Rfl: 0 .  diclofenac sodium  (VOLTAREN) 1 % GEL, APPLY 4 G TOPICALLY 4 TIMES DAILY, Disp: 100 g, Rfl: 1 .  escitalopram (LEXAPRO) 10 MG tablet, TAKE 1 TABLET BY MOUTH ONCE DAILY, Disp: 30 tablet, Rfl: 2 .  fluticasone (FLONASE) 50 MCG/ACT nasal spray, 2 sprays per nostril daily.please dispense 15.22ml, Disp: 16 g, Rfl: 5 .  gabapentin (NEURONTIN) 100 MG capsule, Take two (2) capsules by mouth at bed time. (Patient taking differently: daily as needed. Take two (2) capsules by mouth at bed time.), Disp: 180 capsule, Rfl: 3 .  HYDROcodone-acetaminophen (NORCO/VICODIN) 5-325 MG tablet, Take 1 tablet by mouth 2 (two) times daily as needed., Disp: 60 tablet, Rfl: 0 .  omeprazole (PRILOSEC) 20 MG capsule, TAKE ONE CAPSULE BY MOUTH TWICE DAILY, Disp: 180 capsule, Rfl: 3 .  predniSONE (DELTASONE) 20 MG tablet, Take 3 daily for 2 days, then 2 daily for 2 days, then 1 daily for 2 days., Disp: 12 tablet, Rfl: 0 .  terbinafine (LAMISIL) 250 MG tablet, , Disp: , Rfl:   Allergies:  Allergies  Allergen Reactions  . Penicillins Hives and Rash    Past Medical History, Surgical history, Social history, and Family History were reviewed and updated.  Review of Systems: Review of Systems  Constitutional: Negative.   HENT: Negative.   Eyes: Negative.  Respiratory: Negative.   Cardiovascular: Negative.   Gastrointestinal: Negative.   Genitourinary: Negative.   Musculoskeletal: Positive for joint pain.  Skin: Negative.   Neurological: Negative.   Endo/Heme/Allergies: Negative.   Psychiatric/Behavioral: Negative.      Physical Exam:  weight is 214 lb (97.1 kg). Her oral temperature is 97.9 F (36.6 C). Her blood pressure is 140/92 (abnormal) and her pulse is 71. Her respiration is 18 and oxygen saturation is 93%.   Physical Exam  Constitutional: She is oriented to person, place, and time.  She has bilateral mastectomies.  She has osteo-parotic changes in her back.  She has some kyphosis.  HENT:  Head: Normocephalic and  atraumatic.  Mouth/Throat: Oropharynx is clear and moist.  Eyes: Pupils are equal, round, and reactive to light. EOM are normal.  Neck: Normal range of motion.  Cardiovascular: Normal rate, regular rhythm and normal heart sounds.  Pulmonary/Chest: Effort normal and breath sounds normal.  Abdominal: Soft. Bowel sounds are normal.  Musculoskeletal: Normal range of motion. She exhibits no edema, tenderness or deformity.  She has severe limitation of range of motion of the left arm.  She has mild lymphedema of the left arm.  Lymphadenopathy:    She has no cervical adenopathy.  Neurological: She is alert and oriented to person, place, and time.  Skin: Skin is warm and dry. No rash noted. No erythema.  Psychiatric: She has a normal mood and affect. Her behavior is normal. Judgment and thought content normal.  Vitals reviewed.  .  Lab Results  Component Value Date   WBC 9.1 12/22/2017   HGB 15.6 12/22/2017   HCT 46.4 12/22/2017   MCV 99.4 12/22/2017   PLT 198 12/22/2017     Chemistry      Component Value Date/Time   NA 140 10/05/2017 1004   NA 139 12/23/2016 1018   K 4.6 10/05/2017 1004   K 4.2 12/23/2016 1018   CL 103 10/05/2017 1004   CO2 30 10/05/2017 1004   CO2 29 12/23/2016 1018   BUN 9 10/05/2017 1004   BUN 8.2 12/23/2016 1018   CREATININE 0.68 10/05/2017 1004   CREATININE 0.7 12/23/2016 1018      Component Value Date/Time   CALCIUM 9.0 10/05/2017 1004   CALCIUM 9.5 12/23/2016 1018   ALKPHOS 61 12/30/2016 1642   ALKPHOS 69 12/23/2016 1018   AST 17 10/05/2017 1004   AST 24 12/23/2016 1018   ALT 9 10/05/2017 1004   ALT 15 12/23/2016 1018   BILITOT 0.9 10/05/2017 1004   BILITOT 0.94 12/23/2016 1018         Impression and Plan: [name] is 77 year old white female with a history of stage IIB infiltrating ductal carcinoma the right breast. She did receive some adjuvant chemotherapy. She had chemotherapy that was completed back in June of 2004.  I do not see any  evidence of recurrent disease. She now is 16 years out from therapy.  She likes to come back to see [place]. She just feels much more confident in our examining her.  We will go ahead and her back in another year.  I do not see problems with her having shoulder surgery.   Volanda Napoleon, MD 5/24/201912:42 PM

## 2017-12-26 ENCOUNTER — Telehealth: Payer: Self-pay | Admitting: *Deleted

## 2017-12-26 NOTE — Telephone Encounter (Addendum)
Generic message left on personal voice mail.   ----- Message from Volanda Napoleon, MD sent at 12/22/2017  4:34 PM EDT ----- Call - labs look ok!!  Laurey Arrow

## 2018-01-15 ENCOUNTER — Telehealth: Payer: Self-pay | Admitting: Family Medicine

## 2018-01-15 NOTE — Telephone Encounter (Signed)
Patient called in stating that she has a scratch to her left hand in which she has lymphedema and she stated that the scratch started off leaking clear fluid and now it is red, and hot to the touch. Patient was given and an appointment for tomorrow per the front desk and wanted to know if we could start her on a antibiotic before today. I advised patient that more than likely she would need to be seen first. Please advise

## 2018-01-16 ENCOUNTER — Encounter: Payer: Self-pay | Admitting: Family Medicine

## 2018-01-16 ENCOUNTER — Ambulatory Visit: Payer: PPO | Admitting: Family Medicine

## 2018-01-16 ENCOUNTER — Ambulatory Visit (INDEPENDENT_AMBULATORY_CARE_PROVIDER_SITE_OTHER): Payer: PPO | Admitting: Family Medicine

## 2018-01-16 VITALS — BP 136/74 | HR 66 | Temp 98.3°F | Resp 18 | Ht 64.0 in | Wt 218.0 lb

## 2018-01-16 DIAGNOSIS — Z01818 Encounter for other preprocedural examination: Secondary | ICD-10-CM

## 2018-01-16 DIAGNOSIS — L03113 Cellulitis of right upper limb: Secondary | ICD-10-CM | POA: Diagnosis not present

## 2018-01-16 MED ORDER — DOXYCYCLINE HYCLATE 100 MG PO TABS: 100.0000 mg | ORAL_TABLET | Freq: Two times a day (BID) | ORAL | 0 refills | Status: DC

## 2018-01-16 NOTE — Telephone Encounter (Signed)
Actually, has allergy to pcn, I escribed doxycycline.

## 2018-01-16 NOTE — Telephone Encounter (Signed)
LMOVM that antibx was sent in and did not need appt unless she had other issues.

## 2018-01-16 NOTE — Progress Notes (Signed)
Subjective:    Patient ID: [name], female    DOB: [birth year], 77 y.o.   MRN: [id number]  HPI  Patient recently saw the orthopedic surgeon who recommended shoulder replacement.  He is asking for surgical clearance.  Patient has no history of congestive heart failure or unstable angina.  She denies any chest pain at rest or chest pain with exertion.  She denies any shortness of breath.  She denies any orthopnea or paroxysmal nocturnal dyspnea.  She denies any palpitations, syncope, near syncope.  Recently had a CBC as well as a CMP that were relatively normal and revealed no contraindications to surgery.  Her most recent lab work was in May.  I see no reason to repeat that at the present time.  EKG was obtained today and reveals normal sinus rhythm with no evidence of ischemia or infarction.  The patient does have nonspecific ST changes in lead I and aVL.  However there are no contraindications to surgery present on her EKG.  Over the weekend, she suffered a scratch on the dorsum of her left hand while caring for her dog.  The dog scratched it with his claws.  Now the dorsum of the left hand is erythematous warm and painful.  There is erythematous spreading up the lateral/ulnar side of the left forearm.  This is the arm that has significant lymphedema. Past Medical History:  Diagnosis Date  . Anemia   . Arthritis   . Breast cancer ([place])   . Depression   . Diverticulosis   . Lymphedema of left arm   . Osteoporosis   . Vitamin D deficiency    Past Surgical History:  Procedure Laterality Date  . ABDOMINAL HYSTERECTOMY    . APPENDECTOMY    . CHOLECYSTECTOMY    . MASTECTOMY    . TONSILLECTOMY     Current Outpatient Medications on File Prior to Visit  Medication Sig Dispense Refill  . albuterol (PROAIR HFA) 108 (90 Base) MCG/ACT inhaler Inhale 2 puffs into the lungs every 6 (six) hours as needed. 1 Inhaler 3  . albuterol (PROVENTIL) (2.5 MG/3ML) 0.083% nebulizer solution Take 3 mLs  (2.5 mg total) by nebulization every 6 (six) hours as needed for wheezing or shortness of breath. 75 mL 1  . cetirizine (ZYRTEC) 10 MG tablet Take 1 tablet (10 mg total) by mouth daily. 30 tablet 11  . Cholecalciferol (VITAMIN D) 2000 UNITS tablet Take 2,000 Units by mouth daily. 09-25-12 pt was instructed to increase from 1000 to 2000 units daily    . diclofenac sodium (VOLTAREN) 1 % GEL APPLY 4 G TOPICALLY 4 TIMES DAILY 100 g 1  . escitalopram (LEXAPRO) 10 MG tablet TAKE 1 TABLET BY MOUTH ONCE DAILY 30 tablet 2  . fluticasone (FLONASE) 50 MCG/ACT nasal spray 2 sprays per nostril daily.please dispense 15.90ml 16 g 5  . gabapentin (NEURONTIN) 100 MG capsule Take two (2) capsules by mouth at bed time. (Patient taking differently: daily as needed. Take two (2) capsules by mouth at bed time.) 180 capsule 3  . HYDROcodone-acetaminophen (NORCO/VICODIN) 5-325 MG tablet Take 1 tablet by mouth 2 (two) times daily as needed. 60 tablet 0  . omeprazole (PRILOSEC) 20 MG capsule TAKE ONE CAPSULE BY MOUTH TWICE DAILY 180 capsule 3  . doxycycline (VIBRA-TABS) 100 MG tablet Take 1 tablet (100 mg total) by mouth 2 (two) times daily. (Patient not taking: Reported on 01/16/2018) 20 tablet 0   No current facility-administered medications on file prior to  visit.    Allergies  Allergen Reactions  . Penicillins Hives and Rash   Social History   Socioeconomic History  . Marital status: Divorced    Spouse name: Not on file  . Number of children: Not on file  . Years of education: Not on file  . Highest education level: Not on file  Occupational History  . Not on file  Social Needs  . Financial resource strain: Not on file  . Food insecurity:    Worry: Not on file    Inability: Not on file  . Transportation needs:    Medical: Not on file    Non-medical: Not on file  Tobacco Use  . Smoking status: Never Smoker  . Smokeless tobacco: Never Used  . Tobacco comment: never used tobacco  Substance and Sexual  Activity  . Alcohol use: No    Alcohol/week: 0.0 oz  . Drug use: No  . Sexual activity: Not Currently  Lifestyle  . Physical activity:    Days per week: Not on file    Minutes per session: Not on file  . Stress: Not on file  Relationships  . Social connections:    Talks on phone: Not on file    Gets together: Not on file    Attends religious service: Not on file    Active member of club or organization: Not on file    Attends meetings of clubs or organizations: Not on file    Relationship status: Not on file  . Intimate partner violence:    Fear of current or ex partner: Not on file    Emotionally abused: Not on file    Physically abused: Not on file    Forced sexual activity: Not on file  Other Topics Concern  . Not on file  Social History Narrative  . Not on file      Review of Systems  All other systems reviewed and are negative.      Objective:   Physical Exam  Constitutional: She appears well-developed and well-nourished.  Neck: No JVD present.  Cardiovascular: Normal rate, regular rhythm and normal heart sounds.  No murmur heard. Pulmonary/Chest: Effort normal and breath sounds normal. No respiratory distress. She has no wheezes. She has no rales.  Abdominal: Soft. Bowel sounds are normal. She exhibits no distension. There is no tenderness. There is no rebound.  Musculoskeletal: She exhibits edema.       Left shoulder: She exhibits decreased range of motion, tenderness, pain and decreased strength.       Arms: Lymphadenopathy:    She has no cervical adenopathy.  Skin: There is erythema.       Assessment & Plan:  Preoperative clearance - Plan: EKG 12-Lead  Right arm cellulitis  Appears to be developing cellulitis in the left hand and forearm.  Begin doxycycline 100 mg p.o. twice daily for 7 days.  Patient has surgical clearance to proceed with her upcoming left shoulder replacement.  There are no cardiovascular risk factors seen on the EKG or in her  history that would prevent her from having elective surgery.  Her recent CBC and CMP were within normal limits.  Therefore I will fill out the requisite surgical clearance.

## 2018-01-16 NOTE — Telephone Encounter (Signed)
Just saw message this am. Could start keflex but I still want to see her.

## 2018-01-29 ENCOUNTER — Other Ambulatory Visit: Payer: Self-pay | Admitting: Family Medicine

## 2018-01-29 NOTE — Telephone Encounter (Signed)
Patient is requesting a refill on Hydrocodone   LOV: 01/16/18  LRF:  12/18/17

## 2018-01-29 NOTE — Telephone Encounter (Signed)
Refill on hydrocodone to [facility] [place].  °

## 2018-01-30 MED ORDER — HYDROCODONE-ACETAMINOPHEN 5-325 MG PO TABS: 1.0000 | ORAL_TABLET | Freq: Two times a day (BID) | ORAL | 0 refills | Status: DC | PRN

## 2018-02-15 ENCOUNTER — Other Ambulatory Visit (HOSPITAL_COMMUNITY): Payer: PPO

## 2018-02-15 NOTE — H&P (Signed)
Patient's anticipated LOS is less than 2 midnights, meeting these requirements:  - Lives within 1 hour of care - Has a competent adult at home to recover with post-op recover - NO history of  - Chronic pain requiring opiods         [name] is an 77 y.o. female.    Chief Complaint: left shoulder pain  HPI: Pt is a 77 y.o. female complaining of left shoulder pain for multiple years. Pain had continually increased since the beginning. X-rays in the clinic show end-stage arthritic changes of the left shoulder. Pt has tried various conservative treatments which have failed to alleviate their symptoms, including injections and therapy. Various options are discussed with the patient. Risks, benefits and expectations were discussed with the patient. Patient understand the risks, benefits and expectations and wishes to proceed with surgery.   PCP:  Susy Frizzle, MD  D/C Plans: Home  PMH: Past Medical History:  Diagnosis Date  . Anemia   . Arthritis   . Breast cancer ([place])   . Depression   . Diverticulosis   . Lymphedema of left arm   . Osteoporosis   . Vitamin D deficiency     PSH: Past Surgical History:  Procedure Laterality Date  . ABDOMINAL HYSTERECTOMY    . APPENDECTOMY    . CHOLECYSTECTOMY    . MASTECTOMY    . TONSILLECTOMY      Social History:  reports that she has never smoked. She has never used smokeless tobacco. She reports that she does not drink alcohol or use drugs.  Allergies:  Allergies  Allergen Reactions  . Penicillins Hives and Rash    Has patient had a PCN reaction causing immediate rash, facial/tongue/throat swelling, SOB or lightheadedness with hypotension: Yes Has patient had a PCN reaction causing severe rash involving mucus membranes or skin necrosis: No Has patient had a PCN reaction that required hospitalization: No Has patient had a PCN reaction occurring within the last 10 years: No If all of the above answers are "NO", then  may proceed with Cephalosporin use.     Medications: No current facility-administered medications for this encounter.    Current Outpatient Medications  Medication Sig Dispense Refill  . albuterol (PROAIR HFA) 108 (90 Base) MCG/ACT inhaler Inhale 2 puffs into the lungs every 6 (six) hours as needed. (Patient taking differently: Inhale 2 puffs into the lungs every 6 (six) hours as needed for wheezing or shortness of breath. ) 1 Inhaler 3  . albuterol (PROVENTIL) (2.5 MG/3ML) 0.083% nebulizer solution Take 3 mLs (2.5 mg total) by nebulization every 6 (six) hours as needed for wheezing or shortness of breath. 75 mL 1  . diclofenac sodium (VOLTAREN) 1 % GEL APPLY 4 G TOPICALLY 4 TIMES DAILY (Patient taking differently: APPLY 4 G TOPICALLY 4 TIMES DAILY AS NEEDED FOR PAIN.) 100 g 1  . escitalopram (LEXAPRO) 10 MG tablet TAKE 1 TABLET BY MOUTH ONCE DAILY (Patient taking differently: TAKE 1 TABLET (10 MG) BY MOUTH ONCE DAILY AT 11 AM) 30 tablet 2  . gabapentin (NEURONTIN) 100 MG capsule Take two (2) capsules by mouth at bed time. (Patient taking differently: Take 200 mg by mouth at bedtime as needed (for pain/neuropathy). ) 180 capsule 3  . HYDROcodone-acetaminophen (NORCO/VICODIN) 5-325 MG tablet Take 1 tablet by mouth 2 (two) times daily as needed. (Patient taking differently: Take 1 tablet by mouth 2 (two) times daily as needed (for pain (scheduled at bedtime)). ) 60 tablet 0  .  Multiple Vitamin (MULTIVITAMIN WITH MINERALS) TABS tablet Take 1 tablet by mouth at bedtime. CENTRUM FOR WOMEN 50+    . omeprazole (PRILOSEC) 20 MG capsule TAKE ONE CAPSULE BY MOUTH TWICE DAILY (Patient taking differently: TAKE ONE CAPSULE (20 MG) BY MOUTH ONCE DAILY IN THE MORNING.) 180 capsule 3  . Phenylephrine-Acetaminophen 5-325 MG TABS Take 2 tablets by mouth every 4 (four) hours as needed (for sinus and headaches.). [facility] Sinus plus Headache    . vitamin B-12 (CYANOCOBALAMIN) 1000 MCG tablet Take 1,000 mcg by mouth at  bedtime.    Marland Kitchen doxycycline (VIBRA-TABS) 100 MG tablet Take 1 tablet (100 mg total) by mouth 2 (two) times daily. (Patient not taking: Reported on 01/16/2018) 20 tablet 0  . fluticasone (FLONASE) 50 MCG/ACT nasal spray 2 sprays per nostril daily.please dispense 15.12ml (Patient not taking: Reported on 02/13/2018) 16 g 5    No results found for this or any previous visit (from the past 48 hour(s)). No results found.  ROS: Pain with rom of the left upper extremity  Physical Exam: Alert and oriented 77 y.o. female in no acute distress Cranial nerves 2-12 intact Cervical spine: full rom with no tenderness, nv intact distally Chest: active breath sounds bilaterally, no wheeze rhonchi or rales Heart: regular rate and rhythm, no murmur Abd: non tender non distended with active bowel sounds Hip is stable with rom  Left shoulder with limited rom due to pain and weakness nv intact distally No rashes or edema   Assessment/Plan Assessment: left shoulder cuff arthropathy  Plan:  Patient will undergo a left reverse total shoulder by Dr. Veverly Fells at [facility]. Risks benefits and expectations were discussed with the patient. Patient understand risks, benefits and expectations and wishes to proceed. Preoperative templating of the joint replacement has been completed, documented, and submitted to the Operating Room personnel in order to optimize intra-operative equipment management.   Merla Riches PA-C, MPAS [facility] is now [employer] [address] [place], [place], [place] [address] Phone: [phone] [email or web address] Facebook  [employer]

## 2018-02-19 NOTE — Pre-Procedure Instructions (Signed)
[name]  02/19/2018    Your procedure is scheduled on Friday, February 23, 2018 at 7:30 AM.   Report to [facility] Entrance "A" Admitting Office at 5:30 AM.   Call this number if you have problems the morning of surgery: [phone]   Questions prior to day of surgery, please call [phone] between 8 & 4 PM.   Remember:  Do not eat or drink after midnight Thursday, 02/22/18.  Take these medicines the morning of surgery with A SIP OF WATER: Omeprazole (Prilosec), Albuterol nebulizaer - if needed, Albuterol inhaler - if needed (bring inhaler with you day of surgery)  Stop Multivitamins and NSAIDS (Voltaren, Ibuprofen, Aleve, etc) as of today.    Do not wear jewelry, make-up or nail polish.  Do not wear lotions, powders, perfumes or deodorant.  Do not shave 48 hours prior to surgery.    Do not bring valuables to the hospital.  [facility] is not responsible for any belongings or valuables.  Contacts, dentures or bridgework may not be worn into surgery.  Leave your suitcase in the car.  After surgery it may be brought to your room.  For patients admitted to the hospital, discharge time will be determined by your treatment team.  [facility] - Preparing for Surgery  Before surgery, you can play an important role.  Because skin is not sterile, your skin needs to be as free of germs as possible.  You can reduce the number of germs on you skin by washing with CHG (chlorahexidine gluconate) soap before surgery.  CHG is an antiseptic cleaner which kills germs and bonds with the skin to continue killing germs even after washing.  Oral Hygiene is also important in reducing the risk of infection.  Remember to brush your teeth with your regular toothpaste the morning of surgery.  Please DO NOT use if you have an allergy to CHG or antibacterial soaps.  If your skin becomes reddened/irritated stop using the CHG and inform your nurse when you arrive at Short Stay.  Do not shave  (including legs and underarms) for at least 48 hours prior to the first CHG shower.  You may shave your face.  Please follow these instructions carefully:   1.  Shower with CHG Soap the night before surgery and the morning of Surgery.  2.  If you choose to wash your hair, wash your hair first as usual with your normal shampoo.  3.  After you shampoo, rinse your hair and body thoroughly to remove the shampoo. 4.  Use CHG as you would any other liquid soap.  You can apply chg directly to the skin and wash gently with a      scrungie or washcloth.           5.  Apply the CHG Soap to your body ONLY FROM THE NECK DOWN.   Do not use on open wounds or open sores. Avoid contact with your eyes, ears, mouth and genitals (private parts).  Wash genitals (private parts) with your normal soap.  6.  Wash thoroughly, paying special attention to the area where your surgery will be performed.  7.  Thoroughly rinse your body with warm water from the neck down.  8.  DO NOT shower/wash with your normal soap after using and rinsing off the CHG Soap.  9.  Pat yourself dry with a clean towel.            10.  Wear clean pajamas.  11.  Place clean sheets on your bed the night of your first shower and do not sleep with pets.  Day of Surgery  Shower as above. Do not apply any lotions/deodorants the morning of surgery.   Please wear clean clothes to the hospital. Remember to brush your teeth with toothpaste.   Please read over the fact sheets that you were given.

## 2018-02-20 ENCOUNTER — Other Ambulatory Visit (HOSPITAL_COMMUNITY): Payer: Self-pay | Admitting: *Deleted

## 2018-02-20 ENCOUNTER — Encounter (HOSPITAL_COMMUNITY): Payer: Self-pay

## 2018-02-20 ENCOUNTER — Encounter (HOSPITAL_COMMUNITY)
Admission: RE | Admit: 2018-02-20 | Discharge: 2018-02-20 | Disposition: A | Discharge status: Home / Self Care | Payer: PPO | Source: Ambulatory Visit | Attending: Orthopedic Surgery | Admitting: Orthopedic Surgery

## 2018-02-20 DIAGNOSIS — M19012 Primary osteoarthritis, left shoulder: Secondary | ICD-10-CM | POA: Diagnosis present

## 2018-02-20 DIAGNOSIS — I10 Essential (primary) hypertension: Secondary | ICD-10-CM | POA: Diagnosis present

## 2018-02-20 DIAGNOSIS — M25512 Pain in left shoulder: Secondary | ICD-10-CM | POA: Diagnosis present

## 2018-02-20 DIAGNOSIS — Z471 Aftercare following joint replacement surgery: Secondary | ICD-10-CM | POA: Diagnosis not present

## 2018-02-20 DIAGNOSIS — M12812 Other specific arthropathies, not elsewhere classified, left shoulder: Secondary | ICD-10-CM | POA: Diagnosis not present

## 2018-02-20 DIAGNOSIS — Z96612 Presence of left artificial shoulder joint: Secondary | ICD-10-CM | POA: Diagnosis not present

## 2018-02-20 DIAGNOSIS — K219 Gastro-esophageal reflux disease without esophagitis: Secondary | ICD-10-CM | POA: Diagnosis present

## 2018-02-20 DIAGNOSIS — Z853 Personal history of malignant neoplasm of breast: Secondary | ICD-10-CM | POA: Diagnosis not present

## 2018-02-20 DIAGNOSIS — Z87891 Personal history of nicotine dependence: Secondary | ICD-10-CM | POA: Diagnosis not present

## 2018-02-20 DIAGNOSIS — E669 Obesity, unspecified: Secondary | ICD-10-CM | POA: Diagnosis present

## 2018-02-20 DIAGNOSIS — Z9012 Acquired absence of left breast and nipple: Secondary | ICD-10-CM | POA: Diagnosis not present

## 2018-02-20 DIAGNOSIS — G8918 Other acute postprocedural pain: Secondary | ICD-10-CM | POA: Diagnosis not present

## 2018-02-20 DIAGNOSIS — M75122 Complete rotator cuff tear or rupture of left shoulder, not specified as traumatic: Secondary | ICD-10-CM | POA: Diagnosis not present

## 2018-02-20 DIAGNOSIS — M75102 Unspecified rotator cuff tear or rupture of left shoulder, not specified as traumatic: Secondary | ICD-10-CM | POA: Diagnosis present

## 2018-02-20 HISTORY — DX: Pneumonia, unspecified organism: J18.9

## 2018-02-20 HISTORY — DX: Unspecified asthma, uncomplicated: J45.909

## 2018-02-20 HISTORY — DX: Personal history of other diseases of the digestive system: Z87.19

## 2018-02-20 HISTORY — DX: Essential (primary) hypertension: I10

## 2018-02-20 HISTORY — DX: Gastro-esophageal reflux disease without esophagitis: K21.9

## 2018-02-20 LAB — CBC
HCT: 45.6 % (ref 36.0–46.0)
HEMOGLOBIN: 14.2 g/dL (ref 12.0–15.0)
MCH: 32.3 pg (ref 26.0–34.0)
MCHC: 31.1 g/dL (ref 30.0–36.0)
MCV: 103.9 fL — ABNORMAL HIGH (ref 78.0–100.0)
Platelets: 224 10*3/uL (ref 150–400)
RBC: 4.39 MIL/uL (ref 3.87–5.11)
RDW: 12.6 % (ref 11.5–15.5)
WBC: 7.3 10*3/uL (ref 4.0–10.5)

## 2018-02-20 LAB — BASIC METABOLIC PANEL
ANION GAP: 10 (ref 5–15)
BUN: 11 mg/dL (ref 8–23)
CO2: 28 mmol/L (ref 22–32)
Calcium: 9.2 mg/dL (ref 8.9–10.3)
Chloride: 103 mmol/L (ref 98–111)
Creatinine, Ser: 0.56 mg/dL (ref 0.44–1.00)
GFR calc Af Amer: >60 mL/min (ref >60)
Glucose, Bld: 95 mg/dL (ref 70–99)
Potassium: 4.3 mmol/L (ref 3.5–5.1)
SODIUM: 141 mmol/L (ref 135–145)

## 2018-02-20 LAB — SURGICAL PCR SCREEN
MRSA, PCR: NEGATIVE
STAPHYLOCOCCUS AUREUS: POSITIVE — AB

## 2018-02-20 NOTE — Progress Notes (Signed)
Called pt because she had not arrived for her PAT appt. She stated that it's scheduled for tomorrow "the 23rd". I told her today is the 23rd and she was very upset, I asked her if she could come at 3 PM today and she said she could.

## 2018-02-20 NOTE — Progress Notes (Signed)
Pt denies cardiac history, HTN or Diabetes. Pt does have lymphedema in left arm. Pt has medical clearance in chart from Dr. Dennard Schaumann.

## 2018-02-22 NOTE — Anesthesia Preprocedure Evaluation (Addendum)
Anesthesia Evaluation  Patient identified by MRN, date of birth, ID band Patient awake    Reviewed: Allergy & Precautions, NPO status , Patient's Chart, lab work & pertinent test results  Airway Mallampati: II  TM Distance: >3 FB Neck ROM: Full    Dental no notable dental hx. (+) Edentulous Upper, Edentulous Lower   Pulmonary asthma , former smoker,    Pulmonary exam normal breath sounds clear to auscultation       Cardiovascular Exercise Tolerance: Good hypertension, Normal cardiovascular exam Rhythm:Regular Rate:Normal     Neuro/Psych negative psych ROS   GI/Hepatic GERD  Medicated,  Endo/Other    Renal/GU      Musculoskeletal  (+) Arthritis ,   Abdominal (+) + obese,   Peds  Hematology  (+) anemia ,   Anesthesia Other Findings   Reproductive/Obstetrics negative OB ROS                            Lab Results  Component Value Date   WBC 7.3 02/20/2018   HGB 14.2 02/20/2018   HCT 45.6 02/20/2018   MCV 103.9 (H) 02/20/2018   PLT 224 02/20/2018    Anesthesia Physical Anesthesia Plan  ASA: III  Anesthesia Plan: General and Regional   Post-op Pain Management:  Regional for Post-op pain   Induction: Intravenous  PONV Risk Score and Plan: Ondansetron, Dexamethasone and Treatment may vary due to age or medical condition  Airway Management Planned: Oral ETT  Additional Equipment:   Intra-op Plan:   Post-operative Plan: Extubation in OR  Informed Consent: I have reviewed the patients History and Physical, chart, labs and discussed the procedure including the risks, benefits and alternatives for the proposed anesthesia with the patient or authorized representative who has indicated his/her understanding and acceptance.   Dental advisory given  Plan Discussed with: CRNA and Anesthesiologist  Anesthesia Plan Comments: (L interscalene n block)        Anesthesia Quick  Evaluation

## 2018-02-23 ENCOUNTER — Encounter (HOSPITAL_COMMUNITY)
Admission: RE | Disposition: A | Discharge status: Home / Self Care | Payer: Self-pay | Source: Home / Self Care | Attending: Orthopedic Surgery

## 2018-02-23 ENCOUNTER — Encounter (HOSPITAL_COMMUNITY): Payer: Self-pay | Admitting: Anesthesiology

## 2018-02-23 ENCOUNTER — Inpatient Hospital Stay (HOSPITAL_COMMUNITY)
Admission: RE | Admit: 2018-02-23 | Discharge: 2018-02-24 | DRG: 483 | Disposition: A | Discharge status: Home / Self Care | Payer: PPO | Attending: Orthopedic Surgery | Admitting: Orthopedic Surgery

## 2018-02-23 ENCOUNTER — Other Ambulatory Visit: Payer: Self-pay

## 2018-02-23 ENCOUNTER — Inpatient Hospital Stay (HOSPITAL_COMMUNITY): Payer: PPO | Admitting: Anesthesiology

## 2018-02-23 ENCOUNTER — Inpatient Hospital Stay (HOSPITAL_COMMUNITY): Payer: PPO

## 2018-02-23 DIAGNOSIS — Z6835 Body mass index (BMI) 35.0-35.9, adult: Secondary | ICD-10-CM

## 2018-02-23 DIAGNOSIS — Z87891 Personal history of nicotine dependence: Secondary | ICD-10-CM

## 2018-02-23 DIAGNOSIS — I10 Essential (primary) hypertension: Secondary | ICD-10-CM | POA: Diagnosis present

## 2018-02-23 DIAGNOSIS — M19012 Primary osteoarthritis, left shoulder: Secondary | ICD-10-CM | POA: Diagnosis present

## 2018-02-23 DIAGNOSIS — M25512 Pain in left shoulder: Secondary | ICD-10-CM | POA: Diagnosis present

## 2018-02-23 DIAGNOSIS — Z853 Personal history of malignant neoplasm of breast: Secondary | ICD-10-CM | POA: Diagnosis not present

## 2018-02-23 DIAGNOSIS — Z9012 Acquired absence of left breast and nipple: Secondary | ICD-10-CM

## 2018-02-23 DIAGNOSIS — E669 Obesity, unspecified: Secondary | ICD-10-CM | POA: Diagnosis present

## 2018-02-23 DIAGNOSIS — F329 Major depressive disorder, single episode, unspecified: Secondary | ICD-10-CM | POA: Diagnosis present

## 2018-02-23 DIAGNOSIS — K219 Gastro-esophageal reflux disease without esophagitis: Secondary | ICD-10-CM | POA: Diagnosis present

## 2018-02-23 DIAGNOSIS — Z79891 Long term (current) use of opiate analgesic: Secondary | ICD-10-CM

## 2018-02-23 DIAGNOSIS — M75102 Unspecified rotator cuff tear or rupture of left shoulder, not specified as traumatic: Secondary | ICD-10-CM | POA: Diagnosis present

## 2018-02-23 DIAGNOSIS — Z96612 Presence of left artificial shoulder joint: Secondary | ICD-10-CM

## 2018-02-23 DIAGNOSIS — J45909 Unspecified asthma, uncomplicated: Secondary | ICD-10-CM | POA: Diagnosis present

## 2018-02-23 HISTORY — DX: Personal history of other medical treatment: Z92.89

## 2018-02-23 HISTORY — DX: Malignant neoplasm of unspecified site of left female breast: C50.912

## 2018-02-23 HISTORY — PX: REVERSE SHOULDER ARTHROPLASTY: SHX5054

## 2018-02-23 HISTORY — DX: Malignant neoplasm of unspecified site of right female breast: C50.911

## 2018-02-23 SURGERY — ARTHROPLASTY, SHOULDER, TOTAL, REVERSE
Anesthesia: Regional | Site: Shoulder | Laterality: Left

## 2018-02-23 MED ORDER — FENTANYL CITRATE (PF) 250 MCG/5ML IJ SOLN
INTRAMUSCULAR | Status: AC
  Filled 2018-02-23: qty 5

## 2018-02-23 MED ORDER — 0.9 % SODIUM CHLORIDE (POUR BTL) OPTIME
TOPICAL | Status: DC | PRN
  Administered 2018-02-23: 1000 mL

## 2018-02-23 MED ORDER — METOCLOPRAMIDE HCL 5 MG PO TABS: 5.0000 mg | ORAL_TABLET | Freq: Three times a day (TID) | ORAL | Status: DC | PRN

## 2018-02-23 MED ORDER — EPHEDRINE SULFATE 50 MG/ML IJ SOLN
INTRAMUSCULAR | Status: DC | PRN
  Administered 2018-02-23: 5 mg via INTRAVENOUS

## 2018-02-23 MED ORDER — GABAPENTIN 100 MG PO CAPS: 200.0000 mg | ORAL_CAPSULE | Freq: Every evening | ORAL | Status: DC | PRN

## 2018-02-23 MED ORDER — FENTANYL CITRATE (PF) 100 MCG/2ML IJ SOLN: 25.0000 ug | INTRAMUSCULAR | Status: DC | PRN

## 2018-02-23 MED ORDER — PROPOFOL 10 MG/ML IV BOLUS
INTRAVENOUS | Status: AC
  Filled 2018-02-23: qty 20

## 2018-02-23 MED ORDER — BUPIVACAINE-EPINEPHRINE (PF) 0.25% -1:200000 IJ SOLN
INTRAMUSCULAR | Status: AC
  Filled 2018-02-23: qty 30

## 2018-02-23 MED ORDER — ONDANSETRON HCL 4 MG/2ML IJ SOLN
INTRAMUSCULAR | Status: DC | PRN
  Administered 2018-02-23: 4 mg via INTRAVENOUS

## 2018-02-23 MED ORDER — ACETAMINOPHEN 325 MG PO TABS: 325.0000 mg | ORAL_TABLET | Freq: Four times a day (QID) | ORAL | Status: DC | PRN

## 2018-02-23 MED ORDER — ONDANSETRON HCL 4 MG PO TABS: 4.0000 mg | ORAL_TABLET | Freq: Four times a day (QID) | ORAL | Status: DC | PRN

## 2018-02-23 MED ORDER — ALBUTEROL SULFATE HFA 108 (90 BASE) MCG/ACT IN AERS: 2.0000 | INHALATION_SPRAY | Freq: Four times a day (QID) | RESPIRATORY_TRACT | Status: DC | PRN

## 2018-02-23 MED ORDER — SODIUM CHLORIDE 0.9 % IR SOLN
Status: DC | PRN
  Administered 2018-02-23: 3000 mL

## 2018-02-23 MED ORDER — ROPIVACAINE HCL 5 MG/ML IJ SOLN
INTRAMUSCULAR | Status: DC | PRN
  Administered 2018-02-23: 150 mg via PERINEURAL

## 2018-02-23 MED ORDER — METOCLOPRAMIDE HCL 5 MG/ML IJ SOLN: 5.0000 mg | Freq: Three times a day (TID) | INTRAMUSCULAR | Status: DC | PRN

## 2018-02-23 MED ORDER — HYDROCODONE-ACETAMINOPHEN 5-325 MG PO TABS
1.0000 | ORAL_TABLET | ORAL | Status: DC | PRN
  Administered 2018-02-23: 1 via ORAL
  Administered 2018-02-24: 2 via ORAL
  Administered 2018-02-24 (×2): 1 via ORAL
  Filled 2018-02-23: qty 1
  Filled 2018-02-23: qty 2
  Filled 2018-02-23 (×2): qty 1

## 2018-02-23 MED ORDER — DICLOFENAC SODIUM 1 % TD GEL
2.0000 g | Freq: Four times a day (QID) | TRANSDERMAL | Status: DC
  Administered 2018-02-24: 2 g via TOPICAL
  Filled 2018-02-23: qty 100

## 2018-02-23 MED ORDER — SODIUM CHLORIDE 0.9 % IV SOLN
INTRAVENOUS | Status: DC
  Administered 2018-02-23: 15:00:00 via INTRAVENOUS

## 2018-02-23 MED ORDER — ONDANSETRON HCL 4 MG/2ML IJ SOLN: 4.0000 mg | Freq: Once | INTRAMUSCULAR | Status: DC | PRN

## 2018-02-23 MED ORDER — POLYETHYLENE GLYCOL 3350 17 G PO PACK: 17.0000 g | PACK | Freq: Every day | ORAL | Status: DC | PRN

## 2018-02-23 MED ORDER — HYDROCODONE-ACETAMINOPHEN 5-325 MG PO TABS: 1.0000 | ORAL_TABLET | Freq: Four times a day (QID) | ORAL | 0 refills | Status: DC | PRN

## 2018-02-23 MED ORDER — MORPHINE SULFATE (PF) 2 MG/ML IV SOLN: 0.5000 mg | INTRAVENOUS | Status: DC | PRN

## 2018-02-23 MED ORDER — CLINDAMYCIN PHOSPHATE 600 MG/50ML IV SOLN
600.0000 mg | Freq: Four times a day (QID) | INTRAVENOUS | Status: AC
  Administered 2018-02-23 – 2018-02-24 (×3): 600 mg via INTRAVENOUS
  Filled 2018-02-23 (×3): qty 50

## 2018-02-23 MED ORDER — CHLORHEXIDINE GLUCONATE 4 % EX LIQD
60.0000 mL | Freq: Once | CUTANEOUS | Status: DC
  Administered 2018-02-23: 4 via TOPICAL

## 2018-02-23 MED ORDER — PROPOFOL 10 MG/ML IV BOLUS
INTRAVENOUS | Status: DC | PRN
  Administered 2018-02-23: 80 mg via INTRAVENOUS

## 2018-02-23 MED ORDER — ONDANSETRON HCL 4 MG/2ML IJ SOLN: 4.0000 mg | Freq: Four times a day (QID) | INTRAMUSCULAR | Status: DC | PRN

## 2018-02-23 MED ORDER — BUPIVACAINE-EPINEPHRINE 0.25% -1:200000 IJ SOLN
INTRAMUSCULAR | Status: DC | PRN
  Administered 2018-02-23: 9.5 mL

## 2018-02-23 MED ORDER — BISACODYL 10 MG RE SUPP: 10.0000 mg | Freq: Every day | RECTAL | Status: DC | PRN

## 2018-02-23 MED ORDER — HYDROCODONE-ACETAMINOPHEN 7.5-325 MG PO TABS: 1.0000 | ORAL_TABLET | Freq: Once | ORAL | Status: DC | PRN

## 2018-02-23 MED ORDER — VITAMIN B-12 1000 MCG PO TABS
1000.0000 ug | ORAL_TABLET | Freq: Every day | ORAL | Status: DC
  Administered 2018-02-23: 1000 ug via ORAL
  Filled 2018-02-23: qty 1

## 2018-02-23 MED ORDER — SODIUM CHLORIDE 0.9 % IV SOLN
INTRAVENOUS | Status: DC | PRN
  Administered 2018-02-23: 25 ug/min via INTRAVENOUS

## 2018-02-23 MED ORDER — PANTOPRAZOLE SODIUM 40 MG PO TBEC
40.0000 mg | DELAYED_RELEASE_TABLET | Freq: Every day | ORAL | Status: DC
  Administered 2018-02-24: 40 mg via ORAL
  Filled 2018-02-23 (×2): qty 1

## 2018-02-23 MED ORDER — CLINDAMYCIN PHOSPHATE 900 MG/50ML IV SOLN
INTRAVENOUS | Status: AC
  Filled 2018-02-23: qty 50

## 2018-02-23 MED ORDER — SUGAMMADEX SODIUM 200 MG/2ML IV SOLN
INTRAVENOUS | Status: DC | PRN
  Administered 2018-02-23: 200 mg via INTRAVENOUS

## 2018-02-23 MED ORDER — FENTANYL CITRATE (PF) 100 MCG/2ML IJ SOLN
INTRAMUSCULAR | Status: DC | PRN
  Administered 2018-02-23: 100 ug via INTRAVENOUS
  Administered 2018-02-23: 50 ug via INTRAVENOUS

## 2018-02-23 MED ORDER — DOCUSATE SODIUM 100 MG PO CAPS
100.0000 mg | ORAL_CAPSULE | Freq: Two times a day (BID) | ORAL | Status: DC
  Administered 2018-02-23 – 2018-02-24 (×3): 100 mg via ORAL
  Filled 2018-02-23 (×2): qty 1

## 2018-02-23 MED ORDER — MENTHOL 3 MG MT LOZG: 1.0000 | LOZENGE | OROMUCOSAL | Status: DC | PRN

## 2018-02-23 MED ORDER — ESCITALOPRAM OXALATE 10 MG PO TABS
10.0000 mg | ORAL_TABLET | Freq: Every day | ORAL | Status: DC
  Administered 2018-02-23 – 2018-02-24 (×2): 10 mg via ORAL
  Filled 2018-02-23 (×2): qty 1

## 2018-02-23 MED ORDER — ROCURONIUM BROMIDE 100 MG/10ML IV SOLN
INTRAVENOUS | Status: DC | PRN
  Administered 2018-02-23: 40 mg via INTRAVENOUS

## 2018-02-23 MED ORDER — MEPERIDINE HCL 50 MG/ML IJ SOLN: 6.2500 mg | INTRAMUSCULAR | Status: DC | PRN

## 2018-02-23 MED ORDER — PHENOL 1.4 % MT LIQD: 1.0000 | OROMUCOSAL | Status: DC | PRN

## 2018-02-23 MED ORDER — ALBUTEROL SULFATE (2.5 MG/3ML) 0.083% IN NEBU: 2.5000 mg | INHALATION_SOLUTION | Freq: Four times a day (QID) | RESPIRATORY_TRACT | Status: DC | PRN

## 2018-02-23 MED ORDER — LACTATED RINGERS IV SOLN
INTRAVENOUS | Status: DC | PRN
  Administered 2018-02-23: 07:00:00 via INTRAVENOUS

## 2018-02-23 MED ORDER — SODIUM CHLORIDE 0.9 % IJ SOLN
INTRAMUSCULAR | Status: AC
  Filled 2018-02-23: qty 10

## 2018-02-23 MED ORDER — CLONIDINE HCL (ANALGESIA) 100 MCG/ML EP SOLN
EPIDURAL | Status: DC | PRN
  Administered 2018-02-23: 50 ug

## 2018-02-23 MED ORDER — LIDOCAINE HCL (CARDIAC) PF 100 MG/5ML IV SOSY
PREFILLED_SYRINGE | INTRAVENOUS | Status: DC | PRN
  Administered 2018-02-23: 60 mg via INTRAVENOUS

## 2018-02-23 MED ORDER — EPHEDRINE SULFATE 50 MG/ML IJ SOLN
INTRAMUSCULAR | Status: AC
  Filled 2018-02-23: qty 1

## 2018-02-23 MED ORDER — ACETAMINOPHEN 10 MG/ML IV SOLN: 1000.0000 mg | Freq: Once | INTRAVENOUS | Status: DC | PRN

## 2018-02-23 MED ORDER — CLINDAMYCIN PHOSPHATE 900 MG/50ML IV SOLN
900.0000 mg | INTRAVENOUS | Status: AC
  Administered 2018-02-23: 900 mg via INTRAVENOUS

## 2018-02-23 SURGICAL SUPPLY — 76 items
AID PSTN UNV HD RSTRNT DISP (MISCELLANEOUS) ×1
BANDAGE ACE 4X5 VEL STRL LF (GAUZE/BANDAGES/DRESSINGS) ×2 IMPLANT
BANDAGE ACE 6X5 VEL STRL LF (GAUZE/BANDAGES/DRESSINGS) ×2 IMPLANT
BIT DRILL 170X2.5X (BIT) IMPLANT
BIT DRILL 5/64X5 DISP (BIT) ×3 IMPLANT
BIT DRL 170X2.5X (BIT)
BLADE SAG 18X100X1.27 (BLADE) ×3 IMPLANT
BOWL SMART MIX CTS (DISPOSABLE) ×2 IMPLANT
CAPT SHLDR REVTOTAL 1 ×2 IMPLANT
CEMENT HV SMART SET (Cement) ×2 IMPLANT
CLOSURE STERI-STRIP 1/2X4 (GAUZE/BANDAGES/DRESSINGS) ×1
CLOSURE WOUND 1/2 X4 (GAUZE/BANDAGES/DRESSINGS) ×1
CLSR STERI-STRIP ANTIMIC 1/2X4 (GAUZE/BANDAGES/DRESSINGS) ×1 IMPLANT
COVER SURGICAL LIGHT HANDLE (MISCELLANEOUS) ×3 IMPLANT
DRAPE INCISE IOBAN 66X45 STRL (DRAPES) ×3 IMPLANT
DRAPE ORTHO SPLIT 77X108 STRL (DRAPES) ×6
DRAPE SURG ORHT 6 SPLT 77X108 (DRAPES) ×2 IMPLANT
DRAPE U-SHAPE 47X51 STRL (DRAPES) ×5 IMPLANT
DRILL 2.5 (BIT)
DRSG ADAPTIC 3X8 NADH LF (GAUZE/BANDAGES/DRESSINGS) ×3 IMPLANT
DRSG PAD ABDOMINAL 8X10 ST (GAUZE/BANDAGES/DRESSINGS) ×3 IMPLANT
DURAPREP 26ML APPLICATOR (WOUND CARE) ×3 IMPLANT
ELECT BLADE 4.0 EZ CLEAN MEGAD (MISCELLANEOUS) ×3
ELECT NDL TIP 2.8 STRL (NEEDLE) ×1 IMPLANT
ELECT NEEDLE TIP 2.8 STRL (NEEDLE) ×3 IMPLANT
ELECT REM PT RETURN 9FT ADLT (ELECTROSURGICAL) ×3
ELECTRODE BLDE 4.0 EZ CLN MEGD (MISCELLANEOUS) ×1 IMPLANT
ELECTRODE REM PT RTRN 9FT ADLT (ELECTROSURGICAL) ×1 IMPLANT
GAUZE SPONGE 4X4 12PLY STRL (GAUZE/BANDAGES/DRESSINGS) ×3 IMPLANT
GLOVE BIOGEL PI ORTHO PRO 7.5 (GLOVE) ×2
GLOVE BIOGEL PI ORTHO PRO SZ8 (GLOVE) ×2
GLOVE ORTHO TXT STRL SZ7.5 (GLOVE) ×3 IMPLANT
GLOVE PI ORTHO PRO STRL 7.5 (GLOVE) ×1 IMPLANT
GLOVE PI ORTHO PRO STRL SZ8 (GLOVE) ×1 IMPLANT
GLOVE SURG ORTHO 8.5 STRL (GLOVE) ×3 IMPLANT
GOWN STRL REUS W/ TWL LRG LVL3 (GOWN DISPOSABLE) ×1 IMPLANT
GOWN STRL REUS W/ TWL XL LVL3 (GOWN DISPOSABLE) ×2 IMPLANT
GOWN STRL REUS W/TWL LRG LVL3 (GOWN DISPOSABLE) ×3
GOWN STRL REUS W/TWL XL LVL3 (GOWN DISPOSABLE) ×6
KIT BASIN OR (CUSTOM PROCEDURE TRAY) ×3 IMPLANT
KIT TURNOVER KIT B (KITS) ×3 IMPLANT
MANIFOLD NEPTUNE II (INSTRUMENTS) ×3 IMPLANT
NDL 1/2 CIR MAYO (NEEDLE) ×1 IMPLANT
NDL HYPO 25GX1X1/2 BEV (NEEDLE) ×1 IMPLANT
NEEDLE 1/2 CIR MAYO (NEEDLE) ×3 IMPLANT
NEEDLE HYPO 25GX1X1/2 BEV (NEEDLE) ×3 IMPLANT
NS IRRIG 1000ML POUR BTL (IV SOLUTION) ×3 IMPLANT
PACK SHOULDER (CUSTOM PROCEDURE TRAY) ×3 IMPLANT
PAD ABD 8X10 STRL (GAUZE/BANDAGES/DRESSINGS) ×4 IMPLANT
PAD ARMBOARD 7.5X6 YLW CONV (MISCELLANEOUS) ×4 IMPLANT
PIN GUIDE 1.2 (PIN) IMPLANT
PIN GUIDE GLENOPHERE 1.5MX300M (PIN) IMPLANT
PIN METAGLENE 2.5 (PIN) IMPLANT
RESTRAINT HEAD UNIVERSAL NS (MISCELLANEOUS) ×3 IMPLANT
SLING ARM IMMOBILIZER XL (CAST SUPPLIES) ×2 IMPLANT
SPONGE LAP 18X18 X RAY DECT (DISPOSABLE) IMPLANT
SPONGE LAP 4X18 RFD (DISPOSABLE) ×3 IMPLANT
STAPLER VISISTAT 35W (STAPLE) ×2 IMPLANT
STRIP CLOSURE SKIN 1/2X4 (GAUZE/BANDAGES/DRESSINGS) ×2 IMPLANT
SUCTION FRAZIER HANDLE 10FR (MISCELLANEOUS) ×2
SUCTION TUBE FRAZIER 10FR DISP (MISCELLANEOUS) ×1 IMPLANT
SUT FIBERWIRE #2 38 T-5 BLUE (SUTURE)
SUT MNCRL AB 4-0 PS2 18 (SUTURE) ×3 IMPLANT
SUT VIC AB 0 CT1 27 (SUTURE) ×3
SUT VIC AB 0 CT1 27XBRD ANBCTR (SUTURE) IMPLANT
SUT VIC AB 0 CT2 27 (SUTURE) ×3 IMPLANT
SUT VIC AB 2-0 CT1 27 (SUTURE) ×3
SUT VIC AB 2-0 CT1 TAPERPNT 27 (SUTURE) ×1 IMPLANT
SUT VICRYL 0 CT 1 36IN (SUTURE) ×3 IMPLANT
SUTURE FIBERWR #2 38 T-5 BLUE (SUTURE) IMPLANT
SYR CONTROL 10ML LL (SYRINGE) ×3 IMPLANT
TAPE CLOTH SURG 6X10 WHT LF (GAUZE/BANDAGES/DRESSINGS) ×2 IMPLANT
TOWEL OR 17X24 6PK STRL BLUE (TOWEL DISPOSABLE) ×3 IMPLANT
TOWEL OR 17X26 10 PK STRL BLUE (TOWEL DISPOSABLE) ×3 IMPLANT
TOWER CARTRIDGE SMART MIX (DISPOSABLE) IMPLANT
YANKAUER SUCT BULB TIP NO VENT (SUCTIONS) ×3 IMPLANT

## 2018-02-23 NOTE — Anesthesia Procedure Notes (Signed)
Anesthesia Regional Block: Interscalene brachial plexus block   Pre-Anesthetic Checklist: ,, timeout performed, Correct Patient, Correct Site, Correct Laterality, Correct Procedure, Correct Position, site marked, Risks and benefits discussed, at surgeon's request and post-op pain management  Laterality: Upper and Left  Prep: Betadine, chloraprep, alcohol swabs       Needles:  Injection technique: Single-shot  Needle Type: Stimulator Needle - 40      Needle Gauge: 22     Additional Needles:   Procedures:, nerve stimulator,,,,,,,  Narrative:  Start time: 02/23/2018 7:10 AM End time: 02/23/2018 7:25 AM  Performed by: Personally  Anesthesiologist: Barnet Glasgow, MD  Additional Notes: Block assessed prior to start of surgery        '

## 2018-02-23 NOTE — Interval H&P Note (Signed)
History and Physical Interval Note:  02/23/2018 7:22 AM  [name]  has presented today for surgery, with the diagnosis of left shoulder rotator cuff arthropathy, endstage osteoarthritis  The various methods of treatment have been discussed with the patient and family. After consideration of risks, benefits and other options for treatment, the patient has consented to  Procedure(s): REVERSE LEFT SHOULDER ARTHROPLASTY (Left) as a surgical intervention .  The patient's history has been reviewed, patient examined, no change in status, stable for surgery.  I have reviewed the patient's chart and labs.  Questions were answered to the patient's satisfaction.     [name],STEVEN R

## 2018-02-23 NOTE — Op Note (Signed)
NAME: [name], [name] MEDICAL RECORD GQ:[id number] ACCOUNT [id number] DATE OF BIRTH:[birth year] FACILITY: [facility] LOCATION: [facility] PHYSICIAN:STEVEN Orlena Sheldon, MD  OPERATIVE REPORT  DATE OF PROCEDURE:  02/23/2018  PREOPERATIVE DIAGNOSIS:  Left shoulder end-stage arthritis with rotator cuff insufficiency.  POSTOPERATIVE DIAGNOSIS:  Left shoulder end-stage arthritis with rotator cuff insufficiency.  PROCEDURE PERFORMED:  Left reverse total shoulder arthroplasty using DePuy Delta Xtend prosthesis.  ATTENDING SURGEON:  Esmond Plants, MD    ASSISTANT:  Darol Destine, [place], who was scrubbed during the entire procedure and necessary for satisfactory completion of surgery.  ANESTHESIA:  General anesthesia plus interscalene block.  ESTIMATED BLOOD LOSS:  Under 100 mL.  FLUID REPLACEMENT:  1500 mL crystalloid.  COUNTS:  Instrument counts correct.  COMPLICATIONS:  None.  ANTIBIOTICS:  Perioperative antibiotics were given.  INDICATIONS:  The patient is a 77 year old female with worsening left shoulder pain and dysfunction secondary to rotator cuff tear arthropathy.  The patient has had severe bone loss secondary to bone-on-bone.  The patient has disabling pain and  interference with ADLs and desires operative treatment.  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  She has extensive scar tissue on the chest wall extending towards the anterior deltopectoral  incision area.  The patient also has significant deltoid atrophy.  Due to ongoing severe pain and a desire for improvement in quality of life, we did discuss the option for reverse shoulder replacement.  She wanted to proceed.  Risks and benefits of  surgery were discussed, informed consent obtained.  DESCRIPTION OF PROCEDURE:  After an adequate level of anesthesia was achieved, the patient was positioned in the modified beach-chair position.  Left shoulder correctly identified  and sterilely prepped and draped in the usual manner.  Timeout called.  We  entered the shoulder using standard deltopectoral incision starting at the coracoid process, extending down to the anterior humerus.  Dissection down through the subcutaneous tissues to the medial border of the deltoid.  The pectoralis was absent.  We  found the cephalic vein and retracted the deltoid laterally.  We released scar tissue, which was extensive in the anterior shoulder, getting down to the conjoined tendon.  We retracted that medially, careful to protect the axillary nerve.  We then  released the subscapularis remnant off the lesser tuberosity and tagged for retraction and protection of the axillary nerve.  We then released the capsule off the inferior neck of the humerus, extending and externally rotating and delivering the humeral  side of the wound.  We entered the proximal humerus with a 6 mm reamer, reaming up to a size 10.  We then placed a 10 mm intramedullary resection guide and resected the head at 10 degrees of retroversion for the Delta Xtend prosthesis.  We then removed  excess osteophytes with a large rongeur and subluxed the humerus posteriorly, did a 360-degree capsular labral removal and release, again protecting the axillary nerve.  There was extensive bone loss superiorly and posteriorly.  We then placed our  central guide pin, referencing off the good anterior-inferior bone and then reamed anterior inferiorly to get down to a better orientation for our metaglene baseplate.  Once we had that reaming complete and felt like we had good baseplate support, we  drilled our central peg hole.  We then impacted the baseplate into position and placed a 42 locked screw inferiorly, a 30 locked screw superiorly, and an 18 nonlocked posteriorly.  We had good baseplate  fixation and orientation.  We then placed a 38  eccentric glenosphere into position and screwed that down and secured it.  We did a finger sweep to  make sure the axillary nerve was free and clear, which it was.  We next went ahead and finished our preparation on the humeral side, reaming for the Epi-1  left metaphyseal preparation.  We then placed our trial 10 body Epi-1 centered metaphysis 7.0 setting and placed in 10 degrees of retroversion.  It was a very tight fit, but we were able to get the shoulder reduced.  We then removed the trial  components, irrigated thoroughly, and used a little bit of cement proximally in a potting technique and took the HA-coated 10 stem with the size 1 centered metaphysis 7.0 setting, and we impacted that in 10 degrees of retroversion with good security and  went ahead and reduced with a 38+3 real poly.  Fairly tight conjoined tendon and felt like this was acceptable and not excessively tight.  The shoulder was stable through a good full arc of motion, slight gapping with max external rotation.  The teres  minor seemed to be intact.  I am not sure how functional it is, but that was left in place.  We irrigated thoroughly.  There was no pectoralis to close to, but we closed the subcu with the deltoid to try to get a deep-layer closure and then 2-0 Vicryl  for subcu closure and then staples.  A sterile compressive bandage was applied.  The patient was placed in a shoulder sling and taken to recovery room in stable condition.  LN/NUANCE  D:02/23/2018 T:02/23/2018 JOB:[id number]

## 2018-02-23 NOTE — Anesthesia Procedure Notes (Signed)
Procedure Name: Intubation Date/Time: 02/23/2018 7:44 AM Performed by: Kyung Rudd, CRNA Pre-anesthesia Checklist: Patient identified, Emergency Drugs available, Suction available and Patient being monitored Patient Re-evaluated:Patient Re-evaluated prior to induction Oxygen Delivery Method: Circle system utilized Preoxygenation: Pre-oxygenation with 100% oxygen Induction Type: IV induction Ventilation: Mask ventilation without difficulty Laryngoscope Size: Mac and 4 Grade View: Grade I Tube type: Oral Tube size: 7.0 mm Number of attempts: 1 Airway Equipment and Method: Stylet Placement Confirmation: ETT inserted through vocal cords under direct vision,  positive ETCO2 and breath sounds checked- equal and bilateral Secured at: 20 cm Tube secured with: Tape Dental Injury: Teeth and Oropharynx as per pre-operative assessment

## 2018-02-23 NOTE — Discharge Instructions (Signed)
Keep a pillow or blanket propped behind the left elbow to support the arm.  Keep ice on the shoulder as much as you can.  Ok to use the arm as you can for light activity.  Ok to remove the sling as you would like.  Keep the incision clean and dry for one week, then ok to get it wet in the shower.  Follow up with Dr Veverly Fells in the office in two weeks, call [phone] for appointment

## 2018-02-23 NOTE — Brief Op Note (Signed)
02/23/2018  10:16 AM  PATIENT:  [name]  77 y.o. female  PRE-OPERATIVE DIAGNOSIS:  left shoulder rotator cuff arthropathy, endstage osteoarthritis  POST-OPERATIVE DIAGNOSIS:  left shoulder rotator cuff arthropathy, endstage osteoarthritis  PROCEDURE:  Procedure(s): REVERSE LEFT SHOULDER ARTHROPLASTY (Left) DePuy delta Xtend  SURGEON:  Surgeon(s) and Role:    Netta Cedars, MD - Primary  PHYSICIAN ASSISTANT:   ASSISTANTS: [place] Bruns, PA-C   ANESTHESIA:   regional and general  EBL:  150 mL   BLOOD ADMINISTERED:none  DRAINS: none   LOCAL MEDICATIONS USED:  NONE  SPECIMEN:  No Specimen  DISPOSITION OF SPECIMEN:  N/A  COUNTS:  YES  TOURNIQUET:  * No tourniquets in log *  DICTATION: .Other Dictation: Dictation Number [id number]  PLAN OF CARE: Admit to inpatient   PATIENT DISPOSITION:  PACU - hemodynamically stable.   Delay start of Pharmacological VTE agent (>24hrs) due to surgical blood loss or risk of bleeding: not applicable

## 2018-02-23 NOTE — Transfer of Care (Signed)
Immediate Anesthesia Transfer of Care Note  Patient: [name]  Procedure(s) Performed: REVERSE LEFT SHOULDER ARTHROPLASTY (Left Shoulder)  Patient Location: PACU  Anesthesia Type:GA combined with regional for post-op pain  Level of Consciousness: awake, alert  and oriented  Airway & Oxygen Therapy: Patient Spontanous Breathing and Patient connected to nasal cannula oxygen  Post-op Assessment: Report given to RN, Post -op Vital signs reviewed and stable and Patient moving all extremities  Post vital signs: Reviewed and stable  Last Vitals:  Vitals Value Taken Time  BP    Temp    Pulse    Resp    SpO2      Last Pain: There were no vitals filed for this visit.       Complications: No apparent anesthesia complications

## 2018-02-23 NOTE — Anesthesia Postprocedure Evaluation (Signed)
Anesthesia Post Note  Patient: [name]  Procedure(s) Performed: REVERSE LEFT SHOULDER ARTHROPLASTY (Left Shoulder)     Patient location during evaluation: PACU Anesthesia Type: Regional and General Level of consciousness: awake and alert Pain management: pain level controlled Vital Signs Assessment: post-procedure vital signs reviewed and stable Respiratory status: spontaneous breathing, nonlabored ventilation, respiratory function stable and patient connected to nasal cannula oxygen Cardiovascular status: blood pressure returned to baseline and stable Postop Assessment: no apparent nausea or vomiting Anesthetic complications: no    Last Vitals:  Vitals:   02/23/18 1115 02/23/18 1140  BP: 106/72 (!) 115/54  Pulse: 65   Resp: 15 16  Temp: (!) 36.2 C 36.4 C  SpO2: 95% 98%    Last Pain:  Vitals:   02/23/18 1140  PainSc: 0-No pain                 Barnet Glasgow

## 2018-02-24 LAB — HEMOGLOBIN AND HEMATOCRIT, BLOOD
HCT: 38 % (ref 36.0–46.0)
Hemoglobin: 12.1 g/dL (ref 12.0–15.0)

## 2018-02-24 LAB — BASIC METABOLIC PANEL
ANION GAP: 7 (ref 5–15)
BUN: 11 mg/dL (ref 8–23)
CO2: 29 mmol/L (ref 22–32)
Calcium: 8.3 mg/dL — ABNORMAL LOW (ref 8.9–10.3)
Chloride: 103 mmol/L (ref 98–111)
Creatinine, Ser: 0.67 mg/dL (ref 0.44–1.00)
GFR calc non Af Amer: >60 mL/min (ref >60)
GLUCOSE: 147 mg/dL — AB (ref 70–99)
POTASSIUM: 3.6 mmol/L (ref 3.5–5.1)
SODIUM: 139 mmol/L (ref 135–145)

## 2018-02-24 NOTE — Evaluation (Signed)
Occupational Therapy Evaluation Patient Details Name: [name] MRN: [id number] DOB: 1941/01/04 Today's Date: 02/24/2018    History of Present Illness This 77 y.o. female admitted for left shoulder rotator cuff arthropathy, due to  endstage osteoarthritis   Clinical Impression   Patient evaluated by Occupational Therapy with no further acute OT needs identified. All education has been completed and the patient has no further questions. All education completed.   See below for any follow-up Occupational Therapy or equipment needs. OT is signing off. Thank you for this referral.      Follow Up Recommendations  Follow surgeon's recommendation for DC plan and follow-up therapies;Supervision/Assistance - 24 hour    Equipment Recommendations  None recommended by OT    Recommendations for Other Services       Precautions / Restrictions Precautions Precautions: Shoulder Type of Shoulder Precautions: see orders  Precaution Booklet Issued: Yes (comment) Precaution Comments: reviewed shoulder handout with pt and aunt  Required Braces or Orthoses: Sling Restrictions Weight Bearing Restrictions: Yes LUE Weight Bearing: Non weight bearing      Mobility Bed Mobility Overal bed mobility: Modified Independent             General bed mobility comments: has elevating bed   Transfers Overall transfer level: Modified independent                    Balance Overall balance assessment: Mild deficits observed, not formally tested                                         ADL either performed or assessed with clinical judgement   ADL Overall ADL's : Needs assistance/impaired Eating/Feeding: Set up;Sitting   Grooming: Wash/dry hands;Wash/dry face;Oral care;Supervision/safety;Standing   Upper Body Bathing: Minimal assistance;Sitting   Lower Body Bathing: Minimal assistance;Sit to/from stand   Upper Body Dressing : Maximal assistance;Standing    Lower Body Dressing: Total assistance;Sit to/from stand   Toilet Transfer: Supervision/safety;Ambulation;Comfort height toilet   Toileting- Clothing Manipulation and Hygiene: Moderate assistance;Sit to/from stand       Functional mobility during ADLs: Supervision/safety General ADL Comments: aunt able to safely assist pt      Vision         Perception     Praxis      Pertinent Vitals/Pain Pain Assessment: 0-10 Pain Score: 4  Pain Location: Lt shoulder  Pain Descriptors / Indicators: Operative site guarding Pain Intervention(s): Monitored during session;Repositioned     Hand Dominance Right   Extremity/Trunk Assessment Upper Extremity Assessment Upper Extremity Assessment: LUE deficits/detail LUE Deficits / Details: elbow distally WFL            Communication Communication Communication: No difficulties   Cognition Arousal/Alertness: Awake/alert Behavior During Therapy: WFL for tasks assessed/performed Overall Cognitive Status: Within Functional Limits for tasks assessed                                     General Comments       Exercises Exercises: Shoulder Shoulder Exercises Elbow Flexion: AROM;Left;10 reps;Seated Elbow Extension: AROM;Left;10 reps;Seated Wrist Flexion: AROM;Left;10 reps;Seated Wrist Extension: AROM;10 reps;Left;Seated Digit Composite Flexion: AROM;Left;10 reps;Seated Composite Extension: AROM;Left;10 reps;Seated Neck Flexion: AROM;10 reps;Seated Neck Extension: AROM;10 reps;Seated Neck Lateral Flexion - Right: AROM;10 reps;Seated Neck Lateral Flexion - Left: AROM;10 reps;Seated  Shoulder Instructions Shoulder Instructions Donning/doffing shirt without moving shoulder: Caregiver independent with task Method for sponge bathing under operated UE: Caregiver independent with task;Patient able to independently direct caregiver Donning/doffing sling/immobilizer: Caregiver independent with task Correct positioning of  sling/immobilizer: Caregiver independent with task;Patient able to independently direct caregiver ROM for elbow, wrist and digits of operated UE: Supervision/safety Sling wearing schedule (on at all times/off for ADL's): Independent Proper positioning of operated UE when showering: Supervision/safety;Caregiver independent with task Positioning of UE while sleeping: Independent;Patient able to independently direct caregiver    Home Living Family/patient expects to be discharged to:: Private residence Living Arrangements: Other relatives;Non-relatives/Friends Available Help at Discharge: Family;Friend(s);Available 24 hours/day Type of Home: House             Bathroom Shower/Tub: Tub/shower unit;Curtain   Bathroom Toilet: Handicapped height Bathroom Accessibility: Yes   Home Equipment: Grab bars - toilet;Grab bars - tub/shower          Prior Functioning/Environment Level of Independence: Independent        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         OT Problem List: Decreased strength;Decreased range of motion;Decreased knowledge of use of DME or AE;Decreased knowledge of precautions;Pain;Impaired UE functional use      OT Treatment/Interventions:      OT Goals(Current goals can be found in the care plan section) Acute Rehab OT Goals Patient Stated Goal: to get arm better  OT Goal Formulation: All assessment and education complete, DC therapy  OT Frequency:     Barriers to D/C:            Co-evaluation              AM-PAC PT "6 Clicks" Daily Activity     Outcome Measure Help from another person eating meals?: None Help from another person taking care of personal grooming?: A Little Help from another person toileting, which includes using toliet, bedpan, or urinal?: A Lot Help from another person bathing (including washing, rinsing, drying)?: A Lot Help from another person to put on and taking off regular upper body clothing?: A Lot Help from another  person to put on and taking off regular lower body clothing?: A Lot 6 Click Score: 15   End of Session Equipment Utilized During Treatment: Gait belt;Other (comment)(sling) Nurse Communication: Mobility status  Activity Tolerance:   Patient left: in chair;with call bell/phone within reach;with family/visitor present  OT Visit Diagnosis: Pain Pain - Right/Left: Left Pain - part of body: Shoulder                Time: 3794-3276 OT Time Calculation (min): 33 min Charges:  OT General Charges $OT Visit: 1 Visit OT Evaluation $OT Eval Low Complexity: 1 Low OT Treatments $Self Care/Home Management : 8-22 mins  [facility], OTR/L [phone]   Lucille Passy M 02/24/2018, 7:40 PM

## 2018-02-24 NOTE — Discharge Summary (Signed)
PATIENT ID:      [name]  MRN:     [id number] DOB/AGE:    11/12/1940 / 77 y.o.     DISCHARGE SUMMARY  ADMISSION DATE:    02/23/2018 DISCHARGE DATE:  02/24/2018  ADMISSION DIAGNOSIS: left shoulder rotator cuff arthropathy, endstage osteoarthritis Past Medical History:  Diagnosis Date  . Anemia   . Arthritis    "hands, feet; shoulders; all over" (02/23/2018)  . Asthma    "as a child; acts up a little sometimes" (02/23/2018)  . Breast cancer, left breast ([place]) 1967  . Cancer of right breast ([place]) 2003  . Depression   . Diverticulosis   . GERD (gastroesophageal reflux disease)   . History of blood transfusion 2003   "several; before my right breast OR"  . History of hiatal hernia   . Hypertension    after weight loss, pt came off medications and has not had it since  . Lymphedema of left arm   . Osteoporosis   . Pneumonia    "once" (02/23/2018)  . Vitamin D deficiency     DISCHARGE DIAGNOSIS:   Active Problems:   H/O total shoulder replacement, left   PROCEDURE: Procedure(s): REVERSE LEFT SHOULDER ARTHROPLASTY on 02/23/2018  CONSULTS:    HISTORY:  See H&P in chart.  HOSPITAL COURSE:  [name] is a 77 y.o. admitted on 02/23/2018 with a diagnosis of left shoulder rotator cuff arthropathy, endstage osteoarthritis.  They were brought to the operating room on 02/23/2018 and underwent Procedure(s): REVERSE LEFT SHOULDER ARTHROPLASTY.    They were given perioperative antibiotics:  Anti-infectives (From admission, onward)   Start     Dose/Rate Route Frequency Ordered Stop   02/23/18 1330  clindamycin (CLEOCIN) IVPB 600 mg     600 mg 100 mL/hr over 30 Minutes Intravenous Every 6 hours 02/23/18 1140 02/24/18 0252   02/23/18 0645  clindamycin (CLEOCIN) IVPB 900 mg     900 mg 100 mL/hr over 30 Minutes Intravenous On call to O.R. 02/23/18 9518 02/23/18 0815   02/23/18 8416  clindamycin (CLEOCIN) 900 MG/50ML IVPB    Note to Pharmacy:  Merryl Hacker   : cabinet  override      02/23/18 6063 02/23/18 0745    .  Patient underwent the above named procedure and tolerated it well. The following day they were hemodynamically stable and pain was controlled on oral analgesics. They were neurovascularly intact to the operative extremity. OT was ordered and worked with patient per protocol. They were medically and orthopaedically stable for discharge on day 1 .    DIAGNOSTIC STUDIES:  RECENT RADIOGRAPHIC STUDIES :  Dg Shoulder Left Port  Result Date: 02/23/2018 CLINICAL DATA:  Left shoulder replacement. EXAM: LEFT SHOULDER - 1 VIEW COMPARISON:  CT 11/27/2017. FINDINGS: Total left shoulder replacement. Hardware intact. Anatomic alignment. IMPRESSION: Total left shoulder replacement with anatomic alignment. Electronically Signed   By: Marcello Moores  Register   On: 02/23/2018 11:22    RECENT VITAL SIGNS:   Patient Vitals for the past 24 hrs:  BP Temp Temp src Pulse Resp SpO2  02/24/18 0821 112/68 98.8 F (37.1 C) Oral 77 - 92 %  02/24/18 0404 117/62 98.7 F (37.1 C) Oral 79 - 90 %  02/23/18 2352 108/65 98.4 F (36.9 C) Oral 87 - 92 %  02/23/18 2002 124/63 98.6 F (37 C) Oral 72 - 96 %  02/23/18 1140 (!) 115/54 97.6 F (36.4 C) - - 16 98 %  02/23/18 1115 106/72 Marland Kitchen)  97.2 F (36.2 C) - 65 15 95 %  02/23/18 1100 101/73 - - 71 18 97 %  02/23/18 1045 106/62 - - 69 20 97 %  02/23/18 1030 106/67 - - 69 20 97 %  02/23/18 1015 117/64 - - 71 20 97 %  02/23/18 1000 126/66 - - 67 14 97 %  02/23/18 0945 128/66 - - 72 20 97 %  02/23/18 0938 115/78 (!) 97.2 F (36.2 C) - 70 16 95 %  .  RECENT EKG RESULTS:    Orders placed or performed in visit on 01/16/18  . EKG 12-Lead    DISCHARGE INSTRUCTIONS:    DISCHARGE MEDICATIONS:   Allergies as of 02/24/2018      Reactions   Penicillins Hives, Rash, Other (See Comments)   PATIENT HAS HAD A PCN REACTION WITH IMMEDIATE RASH, FACIAL/TONGUE/THROAT SWELLING, SOB, OR LIGHTHEADEDNESS WITH HYPOTENSION:  #  #  YES  #  #   Has patient had a PCN reaction causing severe rash involving mucus membranes or skin necrosis: No Has patient had a PCN reaction that required hospitalization: No Has patient had a PCN reaction occurring within the last 10 years: No If all of the above answers are "NO", then may proceed with Cephalosporin use.      Medication List    STOP taking these medications   doxycycline 100 MG tablet Commonly known as:  VIBRA-TABS   fluticasone 50 MCG/ACT nasal spray Commonly known as:  FLONASE     TAKE these medications   albuterol (2.5 MG/3ML) 0.083% nebulizer solution Commonly known as:  PROVENTIL Take 3 mLs (2.5 mg total) by nebulization every 6 (six) hours as needed for wheezing or shortness of breath. What changed:  Another medication with the same name was changed. Make sure you understand how and when to take each.   albuterol 108 (90 Base) MCG/ACT inhaler Commonly known as:  PROAIR HFA Inhale 2 puffs into the lungs every 6 (six) hours as needed. What changed:  reasons to take this   diclofenac sodium 1 % Gel Commonly known as:  VOLTAREN APPLY 4 G TOPICALLY 4 TIMES DAILY What changed:  See the new instructions.   escitalopram 10 MG tablet Commonly known as:  LEXAPRO TAKE 1 TABLET BY MOUTH ONCE DAILY What changed:    how much to take  how to take this  when to take this   gabapentin 100 MG capsule Commonly known as:  NEURONTIN Take two (2) capsules by mouth at bed time. What changed:    how much to take  how to take this  when to take this  reasons to take this  additional instructions   HYDROcodone-acetaminophen 5-325 MG tablet Commonly known as:  NORCO/VICODIN Take 1 tablet by mouth 2 (two) times daily as needed. What changed:  reasons to take this   HYDROcodone-acetaminophen 5-325 MG tablet Commonly known as:  NORCO Take 1-2 tablets by mouth every 6 (six) hours as needed for moderate pain or severe pain. What changed:  You were already taking a  medication with the same name, and this prescription was added. Make sure you understand how and when to take each.   multivitamin with minerals Tabs tablet Take 1 tablet by mouth at bedtime. CENTRUM FOR WOMEN 50+   omeprazole 20 MG capsule Commonly known as:  PRILOSEC TAKE ONE CAPSULE BY MOUTH TWICE DAILY What changed:    how much to take  how to take this  when to take this  Phenylephrine-Acetaminophen 5-325 MG Tabs Take 2 tablets by mouth every 4 (four) hours as needed (for sinus and headaches.). [facility] Sinus plus Headache   vitamin B-12 1000 MCG tablet Commonly known as:  CYANOCOBALAMIN Take 1,000 mcg by mouth at bedtime.       FOLLOW UP VISIT:   Follow-up Information    Netta Cedars, MD. Call in 2 weeks.   Specialty:  Orthopedic Surgery Why:  [phone] Contact information: [address] [place] [address] [phone]           DISCHARGE TO: Home   DISCHARGE CONDITION:  Festus Barren, PA-C 02/24/2018, 9:34 AM

## 2018-02-27 ENCOUNTER — Encounter (HOSPITAL_COMMUNITY): Payer: Self-pay | Admitting: Orthopedic Surgery

## 2018-03-08 ENCOUNTER — Telehealth: Payer: Self-pay | Admitting: Family Medicine

## 2018-03-08 DIAGNOSIS — M19012 Primary osteoarthritis, left shoulder: Secondary | ICD-10-CM | POA: Diagnosis not present

## 2018-03-08 DIAGNOSIS — Z96612 Presence of left artificial shoulder joint: Secondary | ICD-10-CM | POA: Diagnosis not present

## 2018-03-08 DIAGNOSIS — Z471 Aftercare following joint replacement surgery: Secondary | ICD-10-CM | POA: Diagnosis not present

## 2018-03-08 MED ORDER — FUROSEMIDE 20 MG PO TABS: 20.0000 mg | ORAL_TABLET | Freq: Every day | ORAL | 3 refills | Status: DC

## 2018-03-08 NOTE — Telephone Encounter (Signed)
Pt had shoulder surgery and is doing good but is having some swelling in feet & legs and Dr. Veverly Fells recommended that she call us to see if we could give her a little bit of lasix to help get the fluid down??

## 2018-03-08 NOTE — Telephone Encounter (Signed)
Patient aware of results and med sent to pharm 

## 2018-03-08 NOTE — Telephone Encounter (Signed)
Lasix 20 mg poqd prn leg swelling.  NTbS if no better

## 2018-03-13 ENCOUNTER — Encounter: Payer: Self-pay | Admitting: Family Medicine

## 2018-03-13 ENCOUNTER — Ambulatory Visit (INDEPENDENT_AMBULATORY_CARE_PROVIDER_SITE_OTHER): Payer: PPO | Admitting: Family Medicine

## 2018-03-13 VITALS — BP 128/82 | HR 70 | Temp 98.1°F | Resp 18 | Ht 64.0 in | Wt 227.0 lb

## 2018-03-13 DIAGNOSIS — R601 Generalized edema: Secondary | ICD-10-CM | POA: Diagnosis not present

## 2018-03-13 DIAGNOSIS — I890 Lymphedema, not elsewhere classified: Secondary | ICD-10-CM | POA: Diagnosis not present

## 2018-03-13 MED ORDER — FUROSEMIDE 40 MG PO TABS: 40.0000 mg | ORAL_TABLET | Freq: Every day | ORAL | 0 refills | Status: DC

## 2018-03-13 MED ORDER — POTASSIUM CHLORIDE CRYS ER 20 MEQ PO TBCR: 20.0000 meq | EXTENDED_RELEASE_TABLET | Freq: Every day | ORAL | 0 refills | Status: DC

## 2018-03-13 NOTE — Progress Notes (Signed)
Subjective:    Patient ID: [name], female    DOB: March 08, 1941, 77 y.o.   MRN: [id number]  HPI   Patient recently had surgery on her left shoulder.  She has been out of the hospital now approximately 2 weeks.  Since leaving the hospital, the patient has been battling swelling in her left arm and in both legs.  She has +2 pitting edema in her left arm distal to her left shoulder.  She has +1 pitting edema in both legs distal to the knees.  She denies any chest pain.  She denies any shortness of breath.  She denies any dyspnea on exertion.  She denies any pleurisy.  She denies any pain in her arms or legs.  She is up walking around.  She has been mobile immediately after surgery making DVT unlikely.  She has been using her right arm.  She has a history of chronic lymphedema in the left arm that predates the surgery and has been a problem even recently.  However she has never experienced swelling in both legs.  Past Medical History:  Diagnosis Date  . Anemia   . Arthritis    "hands, feet; shoulders; all over" (02/23/2018)  . Asthma    "as a child; acts up a little sometimes" (02/23/2018)  . Breast cancer, left breast ([place]) 1967  . Cancer of right breast ([place]) 2003  . Depression   . Diverticulosis   . GERD (gastroesophageal reflux disease)   . History of blood transfusion 2003   "several; before my right breast OR"  . History of hiatal hernia   . Hypertension    after weight loss, pt came off medications and has not had it since  . Lymphedema of left arm   . Osteoporosis   . Pneumonia    "once" (02/23/2018)  . Vitamin D deficiency    Past Surgical History:  Procedure Laterality Date  . ABDOMINAL HYSTERECTOMY    . APPENDECTOMY    . BASAL CELL CARCINOMA EXCISION Right    "top of my forehead"  . BREAST BIOPSY Left 1967  . BREAST BIOPSY Right 2003  . CATARACT EXTRACTION W/ INTRAOCULAR LENS  IMPLANT, BILATERAL Bilateral   . CHOLECYSTECTOMY OPEN    . COLONOSCOPY    . JOINT  REPLACEMENT    . MASTECTOMY Left 1967  . MASTECTOMY Right 2003  . REVERSE SHOULDER ARTHROPLASTY Left 02/23/2018  . REVERSE SHOULDER ARTHROPLASTY Left 02/23/2018   Procedure: REVERSE LEFT SHOULDER ARTHROPLASTY;  Surgeon: Netta Cedars, MD;  Location: [facility];  Service: Orthopedics;  Laterality: Left;  . TONSILLECTOMY    . TOTAL KNEE ARTHROPLASTY Bilateral    Current Outpatient Medications on File Prior to Visit  Medication Sig Dispense Refill  . albuterol (PROAIR HFA) 108 (90 Base) MCG/ACT inhaler Inhale 2 puffs into the lungs every 6 (six) hours as needed. (Patient taking differently: Inhale 2 puffs into the lungs every 6 (six) hours as needed for wheezing or shortness of breath. ) 1 Inhaler 3  . albuterol (PROVENTIL) (2.5 MG/3ML) 0.083% nebulizer solution Take 3 mLs (2.5 mg total) by nebulization every 6 (six) hours as needed for wheezing or shortness of breath. 75 mL 1  . diclofenac sodium (VOLTAREN) 1 % GEL APPLY 4 G TOPICALLY 4 TIMES DAILY (Patient taking differently: APPLY 4 G TOPICALLY 4 TIMES DAILY AS NEEDED FOR PAIN.) 100 g 1  . escitalopram (LEXAPRO) 10 MG tablet TAKE 1 TABLET BY MOUTH ONCE DAILY (Patient taking differently: TAKE 1  TABLET (10 MG) BY MOUTH ONCE DAILY AT 11 AM) 30 tablet 2  . furosemide (LASIX) 20 MG tablet Take 1 tablet (20 mg total) by mouth daily. PRN swelling 30 tablet 3  . gabapentin (NEURONTIN) 100 MG capsule Take two (2) capsules by mouth at bed time. (Patient taking differently: Take 200 mg by mouth at bedtime as needed (for pain/neuropathy). ) 180 capsule 3  . HYDROcodone-acetaminophen (NORCO) 5-325 MG tablet Take 1-2 tablets by mouth every 6 (six) hours as needed for moderate pain or severe pain. 30 tablet 0  . HYDROcodone-acetaminophen (NORCO/VICODIN) 5-325 MG tablet Take 1 tablet by mouth 2 (two) times daily as needed. (Patient taking differently: Take 1 tablet by mouth 2 (two) times daily as needed (for pain (scheduled at bedtime)). ) 60 tablet 0  . Multiple  Vitamin (MULTIVITAMIN WITH MINERALS) TABS tablet Take 1 tablet by mouth at bedtime. CENTRUM FOR WOMEN 50+    . omeprazole (PRILOSEC) 20 MG capsule TAKE ONE CAPSULE BY MOUTH TWICE DAILY (Patient taking differently: TAKE ONE CAPSULE (20 MG) BY MOUTH ONCE DAILY IN THE MORNING.) 180 capsule 3  . Phenylephrine-Acetaminophen 5-325 MG TABS Take 2 tablets by mouth every 4 (four) hours as needed (for sinus and headaches.). [facility] Sinus plus Headache    . vitamin B-12 (CYANOCOBALAMIN) 1000 MCG tablet Take 1,000 mcg by mouth at bedtime.     No current facility-administered medications on file prior to visit.    Allergies  Allergen Reactions  . Penicillins Hives, Rash and Other (See Comments)    PATIENT HAS HAD A PCN REACTION WITH IMMEDIATE RASH, FACIAL/TONGUE/THROAT SWELLING, SOB, OR LIGHTHEADEDNESS WITH HYPOTENSION:  #  #  YES  #  #  Has patient had a PCN reaction causing severe rash involving mucus membranes or skin necrosis: No Has patient had a PCN reaction that required hospitalization: No Has patient had a PCN reaction occurring within the last 10 years: No If all of the above answers are "NO", then may proceed with Cephalosporin use.    Social History   Socioeconomic History  . Marital status: Divorced    Spouse name: Not on file  . Number of children: Not on file  . Years of education: Not on file  . Highest education level: Not on file  Occupational History  . Not on file  Social Needs  . Financial resource strain: Not on file  . Food insecurity:    Worry: Not on file    Inability: Not on file  . Transportation needs:    Medical: Not on file    Non-medical: Not on file  Tobacco Use  . Smoking status: Former Smoker    Packs/day: 0.75    Years: 4.00    Pack years: 3.00    Types: Cigarettes    Last attempt to quit: 1958    Years since quitting: 61.6  . Smokeless tobacco: Never Used  Substance and Sexual Activity  . Alcohol use: Not Currently    Alcohol/week: 0.0 standard  drinks    Comment: 02/23/2018 "glass of wine 1-2 times/yr; if that"  . Drug use: Never  . Sexual activity: Not Currently  Lifestyle  . Physical activity:    Days per week: Not on file    Minutes per session: Not on file  . Stress: Not on file  Relationships  . Social connections:    Talks on phone: Not on file    Gets together: Not on file    Attends religious service: Not on  file    Active member of club or organization: Not on file    Attends meetings of clubs or organizations: Not on file    Relationship status: Not on file  . Intimate partner violence:    Fear of current or ex partner: Not on file    Emotionally abused: Not on file    Physically abused: Not on file    Forced sexual activity: Not on file  Other Topics Concern  . Not on file  Social History Narrative  . Not on file      Review of Systems  All other systems reviewed and are negative.      Objective:   Physical Exam  Constitutional: She appears well-developed and well-nourished.  Cardiovascular: Normal rate, regular rhythm and normal heart sounds.  No murmur heard. Pulmonary/Chest: Effort normal and breath sounds normal. No respiratory distress. She has no wheezes. She has no rales.  Abdominal: Soft. Bowel sounds are normal. She exhibits no distension. There is no tenderness. There is no rebound.  Musculoskeletal:       Left shoulder: She exhibits decreased range of motion, tenderness, pain and decreased strength.   Unable to abduct left shoulder greater than 80 degrees.  Shoulder pops and clicks every time she tries to raise her arm or across her body.  This causes mild pain in her left shoulder. Patient has significant lymphedema in the left arm.  Patient has +2 edema in her left hand, her left forearm, and the distal left bicep area.  As you approach the shoulder, the edema resolves and goes away.  She also has +1 pitting edema in both legs.  Both her feet, her ankles, and her shins up to her knees are  edematous and swollen.  There is no erythema.  There is no weeping edema.  Her lungs are clear to auscultation.  There are no crackles or rails.     Assessment & Plan:  Lymphedema of left arm - Plan: CBC with Differential/Platelet, Brain natriuretic peptide, COMPLETE METABOLIC PANEL WITH GFR  Generalized edema - Plan: CBC with Differential/Platelet, Brain natriuretic peptide, COMPLETE METABOLIC PANEL WITH GFR  Patient has a history of chronic lymphedema in her left arm even prior to her surgery.  I think that the surgery has only exacerbated that condition due to the immobility caused by the surgery.  Also given the swelling in both legs, I believe the patient is fluid overloaded likely secondary to IV fluids received during surgery and possibly in the hospital stay.  I will give the patient 40 mg of Lasix this afternoon and 80 mg of Lasix tomorrow.  We will also take K-Dur 20 milliequivalents today and 20 mEq tomorrow.  I will check a CMP to monitor her kidney function as well as her potassium.  I will check a BNP to evaluate for signs of fluid overload/CHF.  I will reassess the patient on Thursday.  She was placed in a compression wrap with an Ace bandage.  If the swelling persist or is not improving, consider an echocardiogram of the heart to evaluate for decreased ejection fraction.  Consider also an ultrasound of the left upper extremity to rule out DVT however I feel that this is unlikely given the fact the patient also has edema in both legs which would not be explained by an upper extremity DVT.  Furthermore she has had lymphedema in this arm even prior to her surgery.  Therefore I think this is an acute on chronic problem  rather than a DVT after surgery.

## 2018-03-14 LAB — COMPLETE METABOLIC PANEL WITH GFR
AG Ratio: 1.8 (calc) (ref 1.0–2.5)
ALKALINE PHOSPHATASE (APISO): 61 U/L (ref 33–130)
ALT: 13 U/L (ref 6–29)
AST: 23 U/L (ref 10–35)
Albumin: 3.5 g/dL — ABNORMAL LOW (ref 3.6–5.1)
BILIRUBIN TOTAL: 0.6 mg/dL (ref 0.2–1.2)
BUN / CREAT RATIO: 25 (calc) — AB (ref 6–22)
BUN: 14 mg/dL (ref 7–25)
CHLORIDE: 101 mmol/L (ref 98–110)
CO2: 31 mmol/L (ref 20–32)
Calcium: 9.1 mg/dL (ref 8.6–10.4)
Creat: 0.56 mg/dL — ABNORMAL LOW (ref 0.60–0.93)
GFR, Est African American: 103 mL/min/{1.73_m2} (ref >=60)
GFR, Est Non African American: 89 mL/min/{1.73_m2} (ref >=60)
Globulin: 2 g/dL (calc) (ref 1.9–3.7)
Glucose, Bld: 97 mg/dL (ref 65–99)
Potassium: 4.4 mmol/L (ref 3.5–5.3)
Sodium: 139 mmol/L (ref 135–146)
Total Protein: 5.5 g/dL — ABNORMAL LOW (ref 6.1–8.1)

## 2018-03-14 LAB — CBC WITH DIFFERENTIAL/PLATELET
BASOS ABS: 46 {cells}/uL (ref 0–200)
Basophils Relative: 0.6 %
EOS ABS: 370 {cells}/uL (ref 15–500)
Eosinophils Relative: 4.8 %
HCT: 40.4 % (ref 35.0–45.0)
Hemoglobin: 13.4 g/dL (ref 11.7–15.5)
Lymphs Abs: 1409 cells/uL (ref 850–3900)
MCH: 32.4 pg (ref 27.0–33.0)
MCHC: 33.2 g/dL (ref 32.0–36.0)
MCV: 97.8 fL (ref 80.0–100.0)
MONOS PCT: 7.3 %
MPV: 9.3 fL (ref 7.5–12.5)
NEUTROS ABS: 5313 {cells}/uL (ref 1500–7800)
Neutrophils Relative %: 69 %
PLATELETS: 297 10*3/uL (ref 140–400)
RBC: 4.13 10*6/uL (ref 3.80–5.10)
RDW: 11.7 % (ref 11.0–15.0)
TOTAL LYMPHOCYTE: 18.3 %
WBC mixed population: 562 cells/uL (ref 200–950)
WBC: 7.7 10*3/uL (ref 3.8–10.8)

## 2018-03-14 LAB — BRAIN NATRIURETIC PEPTIDE: BRAIN NATRIURETIC PEPTIDE: 68 pg/mL (ref <100)

## 2018-03-15 ENCOUNTER — Encounter: Payer: Self-pay | Admitting: Family Medicine

## 2018-03-15 ENCOUNTER — Ambulatory Visit (INDEPENDENT_AMBULATORY_CARE_PROVIDER_SITE_OTHER): Payer: PPO | Admitting: Family Medicine

## 2018-03-15 VITALS — BP 108/70 | HR 66 | Temp 97.9°F | Resp 20 | Ht 64.0 in | Wt 222.0 lb

## 2018-03-15 DIAGNOSIS — I890 Lymphedema, not elsewhere classified: Secondary | ICD-10-CM

## 2018-03-15 DIAGNOSIS — R601 Generalized edema: Secondary | ICD-10-CM | POA: Diagnosis not present

## 2018-03-15 NOTE — Progress Notes (Signed)
Subjective:    Patient ID: [name], female    DOB: 1941/02/26, 77 y.o.   MRN: [id number]  HPI  03/13/18 Patient recently had surgery on her left shoulder.  She has been out of the hospital now approximately 2 weeks.  Since leaving the hospital, the patient has been battling swelling in her left arm and in both legs.  She has +2 pitting edema in her left arm distal to her left shoulder.  She has +1 pitting edema in both legs distal to the knees.  She denies any chest pain.  She denies any shortness of breath.  She denies any dyspnea on exertion.  She denies any pleurisy.  She denies any pain in her arms or legs.  She is up walking around.  She has been mobile immediately after surgery making DVT unlikely.  She has been using her right arm.  She has a history of chronic lymphedema in the left arm that predates the surgery and has been a problem even recently.  However she has never experienced swelling in both legs.  At that time, my plan was: Patient has a history of chronic lymphedema in her left arm even prior to her surgery.  I think that the surgery has only exacerbated that condition due to the immobility caused by the surgery.  Also given the swelling in both legs, I believe the patient is fluid overloaded likely secondary to IV fluids received during surgery and possibly in the hospital stay.  I will give the patient 40 mg of Lasix this afternoon and 80 mg of Lasix tomorrow.  We will also take K-Dur 20 milliequivalents today and 20 mEq tomorrow.  I will check a CMP to monitor her kidney function as well as her potassium.  I will check a BNP to evaluate for signs of fluid overload/CHF.  I will reassess the patient on Thursday.  She was placed in a compression wrap with an Ace bandage.  If the swelling persist or is not improving, consider an echocardiogram of the heart to evaluate for decreased ejection fraction.  Consider also an ultrasound of the left upper extremity to rule out DVT however  I feel that this is unlikely given the fact the patient also has edema in both legs which would not be explained by an upper extremity DVT.  Furthermore she has had lymphedema in this arm even prior to her surgery.  Therefore I think this is an acute on chronic problem rather than a DVT after surgery.  03/15/18 Patient has lost 5 pounds since I last saw her.  The pitting edema has improved though it still present in both legs.  The swelling in her left arm has improved although it still present.  However now the swelling is good enough in her left arm that she can start wearing her compression sleeve again.  She continues to deny any shortness of breath dyspnea on exertion or chest pain.  Her dry weight is 218 pounds.  She was 227 when I saw her on the 13th.  She is down to 222 now.  Therefore I believe she has an additional 4 pounds of fluid that needs to be removed  Past Medical History:  Diagnosis Date  . Anemia   . Arthritis    "hands, feet; shoulders; all over" (02/23/2018)  . Asthma    "as a child; acts up a little sometimes" (02/23/2018)  . Breast cancer, left breast ([place]) 1967  . Cancer of right breast (  [place]) 2003  . Depression   . Diverticulosis   . GERD (gastroesophageal reflux disease)   . History of blood transfusion 2003   "several; before my right breast OR"  . History of hiatal hernia   . Hypertension    after weight loss, pt came off medications and has not had it since  . Lymphedema of left arm   . Osteoporosis   . Pneumonia    "once" (02/23/2018)  . Vitamin D deficiency    Past Surgical History:  Procedure Laterality Date  . ABDOMINAL HYSTERECTOMY    . APPENDECTOMY    . BASAL CELL CARCINOMA EXCISION Right    "top of my forehead"  . BREAST BIOPSY Left 1967  . BREAST BIOPSY Right 2003  . CATARACT EXTRACTION W/ INTRAOCULAR LENS  IMPLANT, BILATERAL Bilateral   . CHOLECYSTECTOMY OPEN    . COLONOSCOPY    . JOINT REPLACEMENT    . MASTECTOMY Left 1967  . MASTECTOMY  Right 2003  . REVERSE SHOULDER ARTHROPLASTY Left 02/23/2018  . REVERSE SHOULDER ARTHROPLASTY Left 02/23/2018   Procedure: REVERSE LEFT SHOULDER ARTHROPLASTY;  Surgeon: Netta Cedars, MD;  Location: [place];  Service: Orthopedics;  Laterality: Left;  . TONSILLECTOMY    . TOTAL KNEE ARTHROPLASTY Bilateral    Current Outpatient Medications on File Prior to Visit  Medication Sig Dispense Refill  . albuterol (PROAIR HFA) 108 (90 Base) MCG/ACT inhaler Inhale 2 puffs into the lungs every 6 (six) hours as needed. (Patient taking differently: Inhale 2 puffs into the lungs every 6 (six) hours as needed for wheezing or shortness of breath. ) 1 Inhaler 3  . albuterol (PROVENTIL) (2.5 MG/3ML) 0.083% nebulizer solution Take 3 mLs (2.5 mg total) by nebulization every 6 (six) hours as needed for wheezing or shortness of breath. 75 mL 1  . diclofenac sodium (VOLTAREN) 1 % GEL APPLY 4 G TOPICALLY 4 TIMES DAILY (Patient taking differently: APPLY 4 G TOPICALLY 4 TIMES DAILY AS NEEDED FOR PAIN.) 100 g 1  . escitalopram (LEXAPRO) 10 MG tablet TAKE 1 TABLET BY MOUTH ONCE DAILY (Patient taking differently: TAKE 1 TABLET (10 MG) BY MOUTH ONCE DAILY AT 11 AM) 30 tablet 2  . furosemide (LASIX) 20 MG tablet Take 1 tablet (20 mg total) by mouth daily. PRN swelling 30 tablet 3  . furosemide (LASIX) 40 MG tablet Take 1 tablet (40 mg total) by mouth daily. 30 tablet 0  . gabapentin (NEURONTIN) 100 MG capsule Take two (2) capsules by mouth at bed time. (Patient taking differently: Take 200 mg by mouth at bedtime as needed (for pain/neuropathy). ) 180 capsule 3  . HYDROcodone-acetaminophen (NORCO) 5-325 MG tablet Take 1-2 tablets by mouth every 6 (six) hours as needed for moderate pain or severe pain. 30 tablet 0  . HYDROcodone-acetaminophen (NORCO/VICODIN) 5-325 MG tablet Take 1 tablet by mouth 2 (two) times daily as needed. (Patient taking differently: Take 1 tablet by mouth 2 (two) times daily as needed (for pain (scheduled at  bedtime)). ) 60 tablet 0  . Multiple Vitamin (MULTIVITAMIN WITH MINERALS) TABS tablet Take 1 tablet by mouth at bedtime. CENTRUM FOR WOMEN 50+    . omeprazole (PRILOSEC) 20 MG capsule TAKE ONE CAPSULE BY MOUTH TWICE DAILY (Patient taking differently: TAKE ONE CAPSULE (20 MG) BY MOUTH ONCE DAILY IN THE MORNING.) 180 capsule 3  . Phenylephrine-Acetaminophen 5-325 MG TABS Take 2 tablets by mouth every 4 (four) hours as needed (for sinus and headaches.). [facility] Sinus plus Headache    .  potassium chloride SA (K-DUR,KLOR-CON) 20 MEQ tablet Take 1 tablet (20 mEq total) by mouth daily. 30 tablet 0  . vitamin B-12 (CYANOCOBALAMIN) 1000 MCG tablet Take 1,000 mcg by mouth at bedtime.     No current facility-administered medications on file prior to visit.    Allergies  Allergen Reactions  . Penicillins Hives, Rash and Other (See Comments)    PATIENT HAS HAD A PCN REACTION WITH IMMEDIATE RASH, FACIAL/TONGUE/THROAT SWELLING, SOB, OR LIGHTHEADEDNESS WITH HYPOTENSION:  #  #  YES  #  #  Has patient had a PCN reaction causing severe rash involving mucus membranes or skin necrosis: No Has patient had a PCN reaction that required hospitalization: No Has patient had a PCN reaction occurring within the last 10 years: No If all of the above answers are "NO", then may proceed with Cephalosporin use.    Social History   Socioeconomic History  . Marital status: Divorced    Spouse name: Not on file  . Number of children: Not on file  . Years of education: Not on file  . Highest education level: Not on file  Occupational History  . Not on file  Social Needs  . Financial resource strain: Not on file  . Food insecurity:    Worry: Not on file    Inability: Not on file  . Transportation needs:    Medical: Not on file    Non-medical: Not on file  Tobacco Use  . Smoking status: Former Smoker    Packs/day: 0.75    Years: 4.00    Pack years: 3.00    Types: Cigarettes    Last attempt to quit: 1958    Years  since quitting: 61.6  . Smokeless tobacco: Never Used  Substance and Sexual Activity  . Alcohol use: Not Currently    Alcohol/week: 0.0 standard drinks    Comment: 02/23/2018 "glass of wine 1-2 times/yr; if that"  . Drug use: Never  . Sexual activity: Not Currently  Lifestyle  . Physical activity:    Days per week: Not on file    Minutes per session: Not on file  . Stress: Not on file  Relationships  . Social connections:    Talks on phone: Not on file    Gets together: Not on file    Attends religious service: Not on file    Active member of club or organization: Not on file    Attends meetings of clubs or organizations: Not on file    Relationship status: Not on file  . Intimate partner violence:    Fear of current or ex partner: Not on file    Emotionally abused: Not on file    Physically abused: Not on file    Forced sexual activity: Not on file  Other Topics Concern  . Not on file  Social History Narrative  . Not on file      Review of Systems  All other systems reviewed and are negative.      Objective:   Physical Exam  Constitutional: She appears well-developed and well-nourished.  Cardiovascular: Normal rate, regular rhythm and normal heart sounds.  No murmur heard. Pulmonary/Chest: Effort normal and breath sounds normal. No respiratory distress. She has no wheezes. She has no rales.  Abdominal: Soft. Bowel sounds are normal. She exhibits no distension. There is no tenderness. There is no rebound.  Musculoskeletal:       Left shoulder: She exhibits decreased range of motion, tenderness, pain and decreased strength.  Assessment & Plan:  Leg swelling  Obtain echocardiogram of the heart to evaluate for depressed ejection fraction.  BNP is still pending.  Decrease Lasix to 40 mg a day for 3 more doses and then discontinue altogether.  Continue potassium for 3 more doses and then discontinue altogether.

## 2018-03-20 ENCOUNTER — Encounter: Payer: Self-pay | Admitting: Podiatry

## 2018-03-20 ENCOUNTER — Ambulatory Visit: Payer: PPO | Admitting: Podiatry

## 2018-03-20 DIAGNOSIS — M79675 Pain in left toe(s): Secondary | ICD-10-CM | POA: Diagnosis not present

## 2018-03-20 DIAGNOSIS — M79674 Pain in right toe(s): Secondary | ICD-10-CM | POA: Diagnosis not present

## 2018-03-20 DIAGNOSIS — B351 Tinea unguium: Secondary | ICD-10-CM | POA: Diagnosis not present

## 2018-03-21 NOTE — Progress Notes (Signed)
She presents today chief complaint of painful bilateral feet and painful elongated toenails.  States that she has had a lot of swelling.  States that she has been seen by her primary care provider and is going to the cardiologist for an echocardiogram in the near future.  Objective: Vital signs are stable alert and oriented x3 pulses are palpable.  Considerable pitting edema to the bilateral legs and feet.  Toenails are long thick yellow dystrophic-like mycotic painful palpation.  Assessment: Pain in limb secondary to onychomycosis.  Plan: Debridement of toenails 1 through 5 bilateral.

## 2018-03-23 ENCOUNTER — Ambulatory Visit (HOSPITAL_COMMUNITY): Payer: PPO | Attending: Cardiology

## 2018-03-23 ENCOUNTER — Other Ambulatory Visit: Payer: Self-pay

## 2018-03-23 DIAGNOSIS — I110 Hypertensive heart disease with heart failure: Secondary | ICD-10-CM | POA: Diagnosis not present

## 2018-03-23 DIAGNOSIS — Z853 Personal history of malignant neoplasm of breast: Secondary | ICD-10-CM | POA: Insufficient documentation

## 2018-03-23 DIAGNOSIS — I081 Rheumatic disorders of both mitral and tricuspid valves: Secondary | ICD-10-CM | POA: Insufficient documentation

## 2018-03-23 DIAGNOSIS — I5030 Unspecified diastolic (congestive) heart failure: Secondary | ICD-10-CM | POA: Diagnosis not present

## 2018-03-23 DIAGNOSIS — R601 Generalized edema: Secondary | ICD-10-CM | POA: Diagnosis not present

## 2018-04-03 ENCOUNTER — Other Ambulatory Visit: Payer: Self-pay | Admitting: Family Medicine

## 2018-04-03 ENCOUNTER — Telehealth: Payer: Self-pay | Admitting: Podiatry

## 2018-04-03 MED ORDER — GABAPENTIN 100 MG PO CAPS: ORAL_CAPSULE | ORAL | 3 refills | Status: DC

## 2018-04-03 MED ORDER — HYDROCODONE-ACETAMINOPHEN 5-325 MG PO TABS: 1.0000 | ORAL_TABLET | Freq: Two times a day (BID) | ORAL | 0 refills | Status: DC | PRN

## 2018-04-03 NOTE — Addendum Note (Signed)
Addended by: Harriett Sine D on: 04/03/2018 10:19 AM   Modules accepted: Orders

## 2018-04-03 NOTE — Telephone Encounter (Signed)
Pt needs medication refill on gabapentin. [facility] [address]  Phone number [phone]

## 2018-04-03 NOTE — Telephone Encounter (Signed)
[facility] [address]

## 2018-04-03 NOTE — Telephone Encounter (Signed)
Patient is requesting a refill on Hydrocodone   LOV: 03/15/18  LRF:    01/30/18

## 2018-04-05 DIAGNOSIS — M19012 Primary osteoarthritis, left shoulder: Secondary | ICD-10-CM | POA: Diagnosis not present

## 2018-04-05 DIAGNOSIS — M25511 Pain in right shoulder: Secondary | ICD-10-CM | POA: Diagnosis not present

## 2018-04-06 ENCOUNTER — Encounter: Payer: Self-pay | Admitting: Family Medicine

## 2018-04-10 ENCOUNTER — Telehealth: Payer: Self-pay | Admitting: Podiatry

## 2018-04-10 MED ORDER — GABAPENTIN 100 MG PO CAPS: ORAL_CAPSULE | ORAL | 3 refills | Status: DC

## 2018-04-10 NOTE — Addendum Note (Signed)
Addended by: Harriett Sine D on: 04/10/2018 04:23 PM   Modules accepted: Orders

## 2018-04-10 NOTE — Telephone Encounter (Signed)
I informed pt the rx had been sent to the wrong pharmacy, [facility] [address].

## 2018-04-10 NOTE — Telephone Encounter (Signed)
I called last week to get a refill of my gabapentin at [facility] [address]. You said you would get a message to the nurse and I was informed by Dr. Milinda Pointer that I could have refills. I have not got this yet. Could you please give me a call at [phone] if you can't call it in. Thank you.

## 2018-04-12 ENCOUNTER — Other Ambulatory Visit: Payer: Self-pay | Admitting: Family Medicine

## 2018-04-12 DIAGNOSIS — I890 Lymphedema, not elsewhere classified: Secondary | ICD-10-CM

## 2018-04-16 ENCOUNTER — Other Ambulatory Visit: Payer: Self-pay

## 2018-04-16 ENCOUNTER — Encounter: Payer: Self-pay | Admitting: Rehabilitation

## 2018-04-16 ENCOUNTER — Ambulatory Visit: Payer: PPO | Attending: Family Medicine | Admitting: Rehabilitation

## 2018-04-16 DIAGNOSIS — R293 Abnormal posture: Secondary | ICD-10-CM | POA: Insufficient documentation

## 2018-04-16 DIAGNOSIS — M25612 Stiffness of left shoulder, not elsewhere classified: Secondary | ICD-10-CM | POA: Insufficient documentation

## 2018-04-16 DIAGNOSIS — I972 Postmastectomy lymphedema syndrome: Secondary | ICD-10-CM | POA: Insufficient documentation

## 2018-04-16 NOTE — Therapy (Signed)
[facility] [place], [place], [address] Phone: [phone]   Fax:  [phone]  Physical Therapy Evaluation  Patient Details  Name: [name] MRN: [id number] Date of Birth: 07-31-1941 Referring Provider: Jenna Luo, MD   Encounter Date: 04/16/2018  PT End of Session - 04/16/18 2105    Visit Number  1    Number of Visits  18    Date for PT Re-Evaluation  05/28/18    PT Start Time  1017    PT Stop Time  1613    PT Time Calculation (min)  57 min    Activity Tolerance  Patient tolerated treatment well    Behavior During Therapy  [facility] for tasks assessed/performed       Past Medical History:  Diagnosis Date  . Anemia   . Arthritis    "hands, feet; shoulders; all over" (02/23/2018)  . Asthma    "as a child; acts up a little sometimes" (02/23/2018)  . Breast cancer, left breast ([place]) 1967  . Cancer of right breast ([place]) 2003  . Depression   . Diverticulosis   . GERD (gastroesophageal reflux disease)   . History of blood transfusion 2003   "several; before my right breast OR"  . History of hiatal hernia   . Hypertension    after weight loss, pt came off medications and has not had it since  . Lymphedema of left arm   . Osteoporosis   . Pneumonia    "once" (02/23/2018)  . Vitamin D deficiency     Past Surgical History:  Procedure Laterality Date  . ABDOMINAL HYSTERECTOMY    . APPENDECTOMY    . BASAL CELL CARCINOMA EXCISION Right    "top of my forehead"  . BREAST BIOPSY Left 1967  . BREAST BIOPSY Right 2003  . CATARACT EXTRACTION W/ INTRAOCULAR LENS  IMPLANT, BILATERAL Bilateral   . CHOLECYSTECTOMY OPEN    . COLONOSCOPY    . JOINT REPLACEMENT    . MASTECTOMY Left 1967  . MASTECTOMY Right 2003  . REVERSE SHOULDER ARTHROPLASTY Left 02/23/2018  . REVERSE SHOULDER ARTHROPLASTY Left 02/23/2018   Procedure: REVERSE LEFT SHOULDER ARTHROPLASTY;  Surgeon: Netta Cedars, MD;  Location: [facility];   Service: Orthopedics;  Laterality: Left;  . TONSILLECTOMY    . TOTAL KNEE ARTHROPLASTY Bilateral     There were no vitals filed for this visit.   Subjective Assessment - 04/16/18 1515    Subjective  Pt presents with onset of Lt UE swelling x 6 months.  The MD put a soft cast on her arm x 4 days and it looked good, so he told me to get a sleeve.  Got it at [facility].  currently has a Juzo 15-76mmHg.  It sometimes weeps    Pertinent History  Lt breast cancer in 1967 with mastectomy with chemotherapy and radiation, Rt breast cancer in 2003 with Rt mastectomy with radiation with antiestrogens, HTN, osteoporosis, hx of skin cancer, L reverse total shoulder 2019, B TKR     Limitations  Lifting;House hold activities    Patient Stated Goals  decrease swelling in the arm    Currently in Pain?  No/denies         [facility] PT Assessment - 04/16/18 0001      Assessment   Medical Diagnosis  Lt lymphedema UE    Referring Provider  Jenna Luo, MD    Onset Date/Surgical Date  09/29/17   approximate   Hand Dominance  Right    Next MD Visit  as needed    Prior Therapy  at [employer] location for the other arm      Precautions   Precaution Comments  cancer, lymphedema      Restrictions   Weight Bearing Restrictions  No    Other Position/Activity Restrictions  n      Balance Screen   Has the patient fallen in the past 6 months  No    Has the patient had a decrease in activity level because of a fear of falling?   No    Is the patient reluctant to leave their home because of a fear of falling?   No      Home Environment   Living Environment  Private residence    Available Help at Discharge  Family      Prior Function   Level of [place]  Retired      Associate Professor   Overall Cognitive Status  Within Functional Limits for tasks assessed      Observation/Other Assessments   Observations  pt arrives with a compression sleeve with no hand with lots of edema  pooling in the hand    Skin Integrity  intact today but pt reports weeping in the arm that can even pool on the sheets      Coordination   Gross Motor Movements are Fluid and Coordinated  Yes      Posture/Postural Control   Posture/Postural Control  Postural limitations    Postural Limitations  Rounded Shoulders;Increased thoracic kyphosis    Posture Comments  difficulty moving Lt UE      ROM / Strength   AROM / PROM / Strength  AROM;PROM;Strength      AROM   Overall AROM Comments  Lt shoulder limited by Rev TSR    AROM Assessment Site  Shoulder    Right/Left Shoulder  Right;Left    Right Shoulder Flexion  100 Degrees    Right Shoulder ABduction  115 Degrees    Left Shoulder Flexion  40 Degrees    Left Shoulder ABduction  50 Degrees        LYMPHEDEMA/ONCOLOGY QUESTIONNAIRE - 04/16/18 1516      Type   Cancer Type  breast cancer bilateral      Surgeries   Mastectomy Date  08/01/65   Rt 2003   Sentinel Lymph Node Biopsy Date  08/01/65    Number Lymph Nodes Removed  --   pt unsure but most likely ALND due to date of surgery     Date Lymphedema/Swelling Started   Date  09/29/17      Treatment   Active Chemotherapy Treatment  No    Past Chemotherapy Treatment  Yes    Date  08/01/65    Active Radiation Treatment  No    Past Radiation Treatment  Yes    Current Hormone Treatment  No    Past Hormone Therapy  Yes      What other symptoms do you have   Are you Having Heaviness or Tightness  Yes    Are you having Pain  No    Are you having pitting edema  Yes    Is it Hard or Difficult finding clothes that fit  Yes    Do you have infections  No      Lymphedema Assessments   Lymphedema Assessments  Upper extremities      Right Upper Extremity Lymphedema  10 cm Proximal to Olecranon Process  31.6 cm    Olecranon Process  25.3 cm    10 cm Proximal to Ulnar Styloid Process  21.8 cm    Just Proximal to Ulnar Styloid Process  16.8 cm    Across Hand at [employer]  19  cm    At [place] of 2nd Digit  6.1 cm      Left Upper Extremity Lymphedema   10 cm Proximal to Olecranon Process  38.7 cm    Olecranon Process  32.7 cm    15 cm Proximal to Ulnar Styloid Process  31 cm    10 cm Proximal to Ulnar Styloid Process  28 cm    Just Proximal to Ulnar Styloid Process  22.5 cm    Across Hand at [employer]  22 cm    At [place] of 2nd Digit  7 cm             Objective measurements completed on examination: See above findings.      [facility] Adult PT Treatment/Exercise - 04/16/18 0001      Manual Therapy   Manual Therapy  Compression Bandaging    Compression Bandaging  Lotion applied to left UE; thin stockineete on entire arm; elastomull x2 on all 5 fingers; Artiflex x2 on left UE from hand to axilla; 1-6 cm, 2-10 cm, and 1-12 cm Comprilan short stretch compression bandage on left UE from hand to axilla. Pt was then encouraged to use her left UE as much as possible with AROM to promote lymphatic circulation.             PT Education - 04/16/18 2104    Education Details  POC, treatment options    Person(s) Educated  Patient;Other (comment)   sister present   Methods  Explanation    Comprehension  Verbalized understanding          PT Long Term Goals - 04/16/18 2114      PT LONG TERM GOAL #1   Title  Pt will have an understanding of lymphedema precautions and risk reduction strategies    Time  6    Period  Weeks    Status  New    Target Date  05/28/18      PT LONG TERM GOAL #2   Title  Pt will reduce the Lt UE maximally with transition to appropriate daytime and nighttime garments to prevent reoccurence    Time  6    Period  Weeks    Status  New    Target Date  05/28/18      PT LONG TERM GOAL #3   Title  Pt will be independent with HEP for Lt shoulder mobility to improve lymphatic circulation    Time  6    Period  Weeks    Status  New    Target Date  05/28/18      PT LONG TERM GOAL #4   Title  Pt will be educated on self MLD for  the UE    Time  6    Period  Weeks    Status  New    Target Date  05/28/18             Plan - 04/16/18 2106    Clinical Impression Statement  Thailyn presents today with new onset of Lt UE lymphedema after mastectomy and most likely ALND in 1967.  Pt reports that is was a bit swollen before her Lt  Reverse totatl shoulder replacement this year but they decided to do it anyway thinking improved movement of the shoulder would improve swelling, but the swellling just got worse.  She has a hx of lymphedema on the Rt UE and was treated a few year ago per pt at the [employer] location.  She arrives wearing an off the shelf Juzu 15-20 circular knit sleeve with no hand piece with extreme swelling in the hand.  Her ROM is very limited due to shoulder status with only 40deg of flexion and less of abduction.  Her edema is very pitting and should reduce quickly.  Will need a flatm knit garment after reduction.      History and Personal Factors relevant to plan of care:  Lt breast cancer in 1967 with mastectomy with chemotherapy and radiation, Rt breast cancer in 2003 with Rt mastectomy with radiation with antiestrogens, HTN, osteoporosis, hx of skin cancer, L reverse total shoulder 2019, B TKR    Clinical Presentation  Evolving    Clinical Presentation due to:  new onset swelling    Clinical Decision Making  Moderate    Rehab Potential  Good    PT Frequency  3x / week    PT Duration  6 weeks    PT Treatment/Interventions  ADLs/Self Care Home Management;Therapeutic exercise;Manual techniques;Manual lymph drainage;Passive range of motion;Compression bandaging    PT Next Visit Plan  continue CDT Lt UE, write prescption for compression garments (day and night if needed), assist with some shoulder ROM    Consulted and Agree with Plan of Care  Patient       Patient will benefit from skilled therapeutic intervention in order to improve the following deficits and impairments:  Decreased mobility,  Decreased skin integrity, Decreased range of motion, Increased edema, Decreased activity tolerance, Impaired UE functional use  Visit Diagnosis: Postmastectomy lymphedema  Stiffness of left shoulder, not elsewhere classified  Abnormal posture     Problem List Patient Active Problem List   Diagnosis Date Noted  . H/O total shoulder replacement, left 02/23/2018  . Lymphedema of left arm   . Hemorrhoids 09/08/2015  . Obesity 09/08/2015  . OA (osteoarthritis) 05/19/2014  . Vitamin D deficiency   . Diverticulosis   . Primary malignant neoplasm of breast with stage 2 nodal metastasis per American joint committee on cancer 7th edition (n2), right [facility])     Shan Levans, PT 04/16/2018, 9:18 PM  [place], [place], [address] Phone: [phone]   Fax:  [phone]  Name: EVANGELINE UTLEY MRN: [id number] Date of Birth: 03-31-1941

## 2018-04-18 ENCOUNTER — Ambulatory Visit: Payer: PPO

## 2018-04-18 DIAGNOSIS — R293 Abnormal posture: Secondary | ICD-10-CM

## 2018-04-18 DIAGNOSIS — M25612 Stiffness of left shoulder, not elsewhere classified: Secondary | ICD-10-CM

## 2018-04-18 DIAGNOSIS — I972 Postmastectomy lymphedema syndrome: Secondary | ICD-10-CM

## 2018-04-18 NOTE — Therapy (Signed)
[name], [place], [address] Phone: [phone]   Fax:  [phone]  Physical Therapy Treatment  Patient Details  Name: [name] MRN: [id number] Date of Birth: 05/16/1941 Referring Provider: Jenna Luo, MD   Encounter Date: 04/18/2018  PT End of Session - 04/18/18 1157    Visit Number  2    Number of Visits  18    Date for PT Re-Evaluation  05/28/18    PT Start Time  1103    PT Stop Time  1150    PT Time Calculation (min)  47 min    Activity Tolerance  Patient tolerated treatment well    Behavior During Therapy  [facility] for tasks assessed/performed       Past Medical History:  Diagnosis Date  . Anemia   . Arthritis    "hands, feet; shoulders; all over" (02/23/2018)  . Asthma    "as a child; acts up a little sometimes" (02/23/2018)  . Breast cancer, left breast ([place]) 1967  . Cancer of right breast ([place]) 2003  . Depression   . Diverticulosis   . GERD (gastroesophageal reflux disease)   . History of blood transfusion 2003   "several; before my right breast OR"  . History of hiatal hernia   . Hypertension    after weight loss, pt came off medications and has not had it since  . Lymphedema of left arm   . Osteoporosis   . Pneumonia    "once" (02/23/2018)  . Vitamin D deficiency     Past Surgical History:  Procedure Laterality Date  . ABDOMINAL HYSTERECTOMY    . APPENDECTOMY    . BASAL CELL CARCINOMA EXCISION Right    "top of my forehead"  . BREAST BIOPSY Left 1967  . BREAST BIOPSY Right 2003  . CATARACT EXTRACTION W/ INTRAOCULAR LENS  IMPLANT, BILATERAL Bilateral   . CHOLECYSTECTOMY OPEN    . COLONOSCOPY    . JOINT REPLACEMENT    . MASTECTOMY Left 1967  . MASTECTOMY Right 2003  . REVERSE SHOULDER ARTHROPLASTY Left 02/23/2018  . REVERSE SHOULDER ARTHROPLASTY Left 02/23/2018   Procedure: REVERSE LEFT SHOULDER ARTHROPLASTY;  Surgeon: Netta Cedars, MD;  Location: [place];   Service: Orthopedics;  Laterality: Left;  . TONSILLECTOMY    . TOTAL KNEE ARTHROPLASTY Bilateral     There were no vitals filed for this visit.  Subjective Assessment - 04/18/18 1112    Subjective  Tolerated the bandage well, it just tried to unravel on me some.     Pertinent History  Lt breast cancer in 1967 with mastectomy with chemotherapy and radiation, Rt breast cancer in 2003 with Rt mastectomy with radiation with antiestrogens, HTN, osteoporosis, hx of skin cancer, L reverse total shoulder 2019, B TKR     Patient Stated Goals  decrease swelling in the arm    Currently in Pain?  No/denies                       [facility] Adult PT Treatment/Exercise - 04/18/18 0001      Manual Therapy   Manual Therapy  Manual Lymphatic Drainage (MLD);Compression Bandaging    Manual Lymphatic Drainage (MLD)  In Supine: Short neck, superfical and deep abdominals, Lt inguinal nodes, then Lt UE from lateral upper arm to dorsal hand working from proximal to distal then retracing all steps.     Compression Bandaging  Lotion applied to left UE; thin stockinette on entire  arm; elastomull x1 on fingers 1-4; Artiflex x2 on left UE from hand to axilla; 1-6 cm, 2-10 cm (second 10 in herring bone pattern from wrist to elbow for increased pressure to increased circumference), and 1-12 cm Comprilan short stretch compression bandage on left UE from hand to axilla.                   PT Long Term Goals - 04/16/18 2114      PT LONG TERM GOAL #1   Title  Pt will have an understanding of lymphedema precautions and risk reduction strategies    Time  6    Period  Weeks    Status  New    Target Date  05/28/18      PT LONG TERM GOAL #2   Title  Pt will reduce the Lt UE maximally with transition to appropriate daytime and nighttime garments to prevent reoccurence    Time  6    Period  Weeks    Status  New    Target Date  05/28/18      PT LONG TERM GOAL #3   Title  Pt will be independent with  HEP for Lt shoulder mobility to improve lymphatic circulation    Time  6    Period  Weeks    Status  New    Target Date  05/28/18      PT LONG TERM GOAL #4   Title  Pt will be educated on self MLD for the UE    Time  6    Period  Weeks    Status  New    Target Date  05/28/18            Plan - 04/18/18 1202    Clinical Impression Statement  Removed pts bandages from arm assessing skin throughout, which looked great. Pt reported tolerating the bandages really well except that they had loosened some. She was able to wash arm and then continued with complete decongestive therapy. Altered bandaging to include herring bone fashion at forearm where pt has greatest circumference. She reported bandaging felt good after session and that she will continue to move and use her arm as she was instructed at last visit.     Rehab Potential  Good    PT Frequency  3x / week    PT Duration  6 weeks    PT Treatment/Interventions  ADLs/Self Care Home Management;Therapeutic exercise;Manual techniques;Manual lymph drainage;Passive range of motion;Compression bandaging    PT Next Visit Plan  continue CDT Lt UE, write prescption for compression garments (day and night if needed), assist with some shoulder ROM    Recommended Other Services  Faxed script to referring doctor for day and nighttime garments.     Consulted and Agree with Plan of Care  Patient       Patient will benefit from skilled therapeutic intervention in order to improve the following deficits and impairments:  Decreased mobility, Decreased skin integrity, Decreased range of motion, Increased edema, Decreased activity tolerance, Impaired UE functional use  Visit Diagnosis: Postmastectomy lymphedema  Stiffness of left shoulder, not elsewhere classified  Abnormal posture     Problem List Patient Active Problem List   Diagnosis Date Noted  . H/O total shoulder replacement, left 02/23/2018  . Lymphedema of left arm   . Hemorrhoids  09/08/2015  . Obesity 09/08/2015  . OA (osteoarthritis) 05/19/2014  . Vitamin D deficiency   . Diverticulosis   . Primary malignant neoplasm  of breast with stage 2 nodal metastasis per American joint committee on cancer 7th edition (n2), right [facility])     [name], PTA 04/18/2018, 12:10 PM  [place], [place], [address] Phone: [phone]   Fax:  [phone]  Name: ALLEY NEILS MRN: [id number] Date of Birth: 10/09/1940

## 2018-04-20 ENCOUNTER — Ambulatory Visit: Payer: PPO | Admitting: Physical Therapy

## 2018-04-20 ENCOUNTER — Encounter: Payer: Self-pay | Admitting: Physical Therapy

## 2018-04-20 DIAGNOSIS — I972 Postmastectomy lymphedema syndrome: Secondary | ICD-10-CM | POA: Diagnosis not present

## 2018-04-20 DIAGNOSIS — R293 Abnormal posture: Secondary | ICD-10-CM

## 2018-04-20 DIAGNOSIS — M25612 Stiffness of left shoulder, not elsewhere classified: Secondary | ICD-10-CM

## 2018-04-20 NOTE — Patient Instructions (Signed)

## 2018-04-20 NOTE — Therapy (Signed)
[place], [place], [address] Phone: [phone]   Fax:  [phone]  Physical Therapy Treatment  Patient Details  Name: [name] MRN: [id number] Date of Birth: 05-10-1941 Referring Provider: Jenna Luo, MD   Encounter Date: 04/20/2018  PT End of Session - 04/20/18 1208    Visit Number  3    Number of Visits  18    Date for PT Re-Evaluation  05/28/18    PT Start Time  4235    PT Stop Time  1100    PT Time Calculation (min)  45 min    Activity Tolerance  Patient tolerated treatment well    Behavior During Therapy  [facility] for tasks assessed/performed       Past Medical History:  Diagnosis Date  . Anemia   . Arthritis    "hands, feet; shoulders; all over" (02/23/2018)  . Asthma    "as a child; acts up a little sometimes" (02/23/2018)  . Breast cancer, left breast ([place]) 1967  . Cancer of right breast ([place]) 2003  . Depression   . Diverticulosis   . GERD (gastroesophageal reflux disease)   . History of blood transfusion 2003   "several; before my right breast OR"  . History of hiatal hernia   . Hypertension    after weight loss, pt came off medications and has not had it since  . Lymphedema of left arm   . Osteoporosis   . Pneumonia    "once" (02/23/2018)  . Vitamin D deficiency     Past Surgical History:  Procedure Laterality Date  . ABDOMINAL HYSTERECTOMY    . APPENDECTOMY    . BASAL CELL CARCINOMA EXCISION Right    "top of my forehead"  . BREAST BIOPSY Left 1967  . BREAST BIOPSY Right 2003  . CATARACT EXTRACTION W/ INTRAOCULAR LENS  IMPLANT, BILATERAL Bilateral   . CHOLECYSTECTOMY OPEN    . COLONOSCOPY    . JOINT REPLACEMENT    . MASTECTOMY Left 1967  . MASTECTOMY Right 2003  . REVERSE SHOULDER ARTHROPLASTY Left 02/23/2018  . REVERSE SHOULDER ARTHROPLASTY Left 02/23/2018   Procedure: REVERSE LEFT SHOULDER ARTHROPLASTY;  Surgeon: Netta Cedars, MD;  Location: [place];   Service: Orthopedics;  Laterality: Left;  . TONSILLECTOMY    . TOTAL KNEE ARTHROPLASTY Bilateral     There were no vitals filed for this visit.  Subjective Assessment - 04/20/18 1023    Subjective  Pt pleased with how her arm is reducing     Pertinent History  Lt breast cancer in 1967 with mastectomy with chemotherapy and radiation, Rt breast cancer in 2003 with Rt mastectomy with radiation with antiestrogens, HTN, osteoporosis, hx of skin cancer, L reverse total shoulder 2019, B TKR     Patient Stated Goals  decrease swelling in the arm    Currently in Pain?  No/denies                       [facility] Adult PT Treatment/Exercise - 04/20/18 0001      Manual Therapy   Manual Therapy  Edema management    Manual therapy comments  --    Edema Management  Pt has been instructed in elevation and exercise for wrist,hand  finger, elbow and shoulder range of motion  skin checked and looks good     Manual Lymphatic Drainage (MLD)  In Supine: Short neck, superfical and deep abdominals, Lt inguinal nodes, then Lt UE  from lateral upper arm to dorsal hand working from proximal to distal then retracing all steps.     Compression Bandaging  Lotion applied to left UE; thin stockinette on entire arm; elastomull x1 on fingers 1-4; Artiflex x2 on left UE from hand to axilla; 1-6 cm, 2-10 cm (second 10 in herring bone pattern from wrist to elbow for increased pressure to increased circumference), and 1-12 cm Comprilan short stretch compression bandage on left UE from hand to axilla.                   PT Long Term Goals - 04/16/18 2114      PT LONG TERM GOAL #1   Title  Pt will have an understanding of lymphedema precautions and risk reduction strategies    Time  6    Period  Weeks    Status  New    Target Date  05/28/18      PT LONG TERM GOAL #2   Title  Pt will reduce the Lt UE maximally with transition to appropriate daytime and nighttime garments to prevent reoccurence    Time   6    Period  Weeks    Status  New    Target Date  05/28/18      PT LONG TERM GOAL #3   Title  Pt will be independent with HEP for Lt shoulder mobility to improve lymphatic circulation    Time  6    Period  Weeks    Status  New    Target Date  05/28/18      PT LONG TERM GOAL #4   Title  Pt will be educated on self MLD for the UE    Time  6    Period  Weeks    Status  New    Target Date  05/28/18            Plan - 04/20/18 1209    Clinical Impression Statement  Pt continues to do well with complete decongestive therapy. Visual improvments in hand with pitting edema still evident in forearm and upper medial arm.  Anticipate she my be ready for tribute wrap from [employer] to wear at times that she does not have the bandages at home until she is ready for compression sleeve.     Rehab Potential  Good    PT Frequency  3x / week    PT Duration  6 weeks    PT Next Visit Plan  Remeasure next time she comes in with bandages intact. continue CDT Lt UE, write prescption for compression garments (day and night if needed), assist with some shoulder ROM    Consulted and Agree with Plan of Care  Patient       Patient will benefit from skilled therapeutic intervention in order to improve the following deficits and impairments:  Decreased mobility, Decreased skin integrity, Decreased range of motion, Increased edema, Decreased activity tolerance, Impaired UE functional use  Visit Diagnosis: Postmastectomy lymphedema  Stiffness of left shoulder, not elsewhere classified  Abnormal posture     Problem List Patient Active Problem List   Diagnosis Date Noted  . H/O total shoulder replacement, left 02/23/2018  . Lymphedema of left arm   . Hemorrhoids 09/08/2015  . Obesity 09/08/2015  . OA (osteoarthritis) 05/19/2014  . Vitamin D deficiency   . Diverticulosis   . Primary malignant neoplasm of breast with stage 2 nodal metastasis per American joint committee on cancer 7th edition (n2),  right ([place])    Donato Heinz. Owens Shark PT  Norwood Levo 04/20/2018, 12:13 PM  [place], [place], [address] Phone: [phone]   Fax:  [phone]  Name: [name] MRN: [id number] Date of Birth: 03/30/1941

## 2018-04-23 ENCOUNTER — Ambulatory Visit: Payer: PPO | Admitting: Rehabilitation

## 2018-04-23 ENCOUNTER — Encounter: Payer: Self-pay | Admitting: Rehabilitation

## 2018-04-23 DIAGNOSIS — I972 Postmastectomy lymphedema syndrome: Secondary | ICD-10-CM | POA: Diagnosis not present

## 2018-04-23 DIAGNOSIS — M25612 Stiffness of left shoulder, not elsewhere classified: Secondary | ICD-10-CM

## 2018-04-23 DIAGNOSIS — R293 Abnormal posture: Secondary | ICD-10-CM

## 2018-04-23 NOTE — Therapy (Signed)
[place], [place], [address] Phone: [phone]   Fax:  [phone]  Physical Therapy Treatment  Patient Details  Name: [name] MRN: [id number] Date of Birth: 1941-06-12 Referring Provider: Jenna Luo, MD   Encounter Date: 04/23/2018  PT End of Session - 04/23/18 1705    Visit Number  4    Number of Visits  18    Date for PT Re-Evaluation  05/28/18    PT Start Time  0350    PT Stop Time  1700    PT Time Calculation (min)  45 min    Activity Tolerance  Patient tolerated treatment well    Behavior During Therapy  [facility] for tasks assessed/performed       Past Medical History:  Diagnosis Date  . Anemia   . Arthritis    "hands, feet; shoulders; all over" (02/23/2018)  . Asthma    "as a child; acts up a little sometimes" (02/23/2018)  . Breast cancer, left breast ([place]) 1967  . Cancer of right breast ([place]) 2003  . Depression   . Diverticulosis   . GERD (gastroesophageal reflux disease)   . History of blood transfusion 2003   "several; before my right breast OR"  . History of hiatal hernia   . Hypertension    after weight loss, pt came off medications and has not had it since  . Lymphedema of left arm   . Osteoporosis   . Pneumonia    "once" (02/23/2018)  . Vitamin D deficiency     Past Surgical History:  Procedure Laterality Date  . ABDOMINAL HYSTERECTOMY    . APPENDECTOMY    . BASAL CELL CARCINOMA EXCISION Right    "top of my forehead"  . BREAST BIOPSY Left 1967  . BREAST BIOPSY Right 2003  . CATARACT EXTRACTION W/ INTRAOCULAR LENS  IMPLANT, BILATERAL Bilateral   . CHOLECYSTECTOMY OPEN    . COLONOSCOPY    . JOINT REPLACEMENT    . MASTECTOMY Left 1967  . MASTECTOMY Right 2003  . REVERSE SHOULDER ARTHROPLASTY Left 02/23/2018  . REVERSE SHOULDER ARTHROPLASTY Left 02/23/2018   Procedure: REVERSE LEFT SHOULDER ARTHROPLASTY;  Surgeon: Netta Cedars, MD;  Location: [place];   Service: Orthopedics;  Laterality: Left;  . TONSILLECTOMY    . TOTAL KNEE ARTHROPLASTY Bilateral     There were no vitals filed for this visit.  Subjective Assessment - 04/23/18 1612    Subjective  Doing well     Pertinent History  Lt breast cancer in 1967 with mastectomy with chemotherapy and radiation, Rt breast cancer in 2003 with Rt mastectomy with radiation with antiestrogens, HTN, osteoporosis, hx of skin cancer, L reverse total shoulder 2019, B TKR     Patient Stated Goals  decrease swelling in the arm    Currently in Pain?  No/denies                       [facility] Adult PT Treatment/Exercise - 04/23/18 0001      Manual Therapy   Manual Lymphatic Drainage (MLD)  In Supine: Short neck, superfical and deep abdominals, Lt inguinal nodes, then Lt UE from lateral upper arm to dorsal hand working from proximal to distal then retracing all steps.     Compression Bandaging  Lotion applied to left UE; thin stockinette on entire arm; elastomull x1 on fingers 1-4 with paper tape covering; Artiflexon left UE from hand to axilla; 1-6 cm, 2-10 cm (  second 10 in herring bone pattern from wrist to elbow for increased pressure to increased circumference), and 1-12 cm Comprilan short stretch compression bandage on left UE from hand to axilla.                   PT Long Term Goals - 04/16/18 2114      PT LONG TERM GOAL #1   Title  Pt will have an understanding of lymphedema precautions and risk reduction strategies    Time  6    Period  Weeks    Status  New    Target Date  05/28/18      PT LONG TERM GOAL #2   Title  Pt will reduce the Lt UE maximally with transition to appropriate daytime and nighttime garments to prevent reoccurence    Time  6    Period  Weeks    Status  New    Target Date  05/28/18      PT LONG TERM GOAL #3   Title  Pt will be independent with HEP for Lt shoulder mobility to improve lymphatic circulation    Time  6    Period  Weeks    Status  New     Target Date  05/28/18      PT LONG TERM GOAL #4   Title  Pt will be educated on self MLD for the UE    Time  6    Period  Weeks    Status  New    Target Date  05/28/18            Plan - 04/23/18 1706    Clinical Impression Statement  [name] continues to tolerate CDT well with new tubigrip looser on her arm and without ballooning in the hand.  Still with pitting edema Lt forearm.      PT Frequency  3x / week    PT Duration  6 weeks    PT Treatment/Interventions  ADLs/Self Care Home Management;Therapeutic exercise;Manual techniques;Manual lymph drainage;Passive range of motion;Compression bandaging    PT Next Visit Plan  remeasure next visit unless she was unable to get in until friday, continue CDT Lt UE, will set up at a special place for day and night garments through [employer] when ready     Consulted and Agree with Plan of Care  Patient       Patient will benefit from skilled therapeutic intervention in order to improve the following deficits and impairments:  Decreased mobility, Decreased skin integrity, Decreased range of motion, Increased edema, Decreased activity tolerance, Impaired UE functional use  Visit Diagnosis: Postmastectomy lymphedema  Stiffness of left shoulder, not elsewhere classified  Abnormal posture     Problem List Patient Active Problem List   Diagnosis Date Noted  . H/O total shoulder replacement, left 02/23/2018  . Lymphedema of left arm   . Hemorrhoids 09/08/2015  . Obesity 09/08/2015  . OA (osteoarthritis) 05/19/2014  . Vitamin D deficiency   . Diverticulosis   . Primary malignant neoplasm of breast with stage 2 nodal metastasis per American joint committee on cancer 7th edition (n2), right [facility])     Shan Levans, PT 04/23/2018, 5:08 PM  [place], [place], [address] Phone: [phone]   Fax:  [phone]  Name: [name] MRN: [id number] Date  of Birth: [birth year]

## 2018-04-25 ENCOUNTER — Ambulatory Visit: Payer: PPO

## 2018-04-27 ENCOUNTER — Encounter: Payer: Self-pay | Admitting: Rehabilitation

## 2018-04-27 ENCOUNTER — Ambulatory Visit: Payer: PPO | Admitting: Rehabilitation

## 2018-04-27 DIAGNOSIS — M25612 Stiffness of left shoulder, not elsewhere classified: Secondary | ICD-10-CM

## 2018-04-27 DIAGNOSIS — R293 Abnormal posture: Secondary | ICD-10-CM

## 2018-04-27 DIAGNOSIS — I972 Postmastectomy lymphedema syndrome: Secondary | ICD-10-CM | POA: Diagnosis not present

## 2018-04-27 NOTE — Therapy (Signed)
[place], [place], [address] Phone: [phone]   Fax:  [phone]  Physical Therapy Treatment  Patient Details  Name: [name] MRN: [id number] Date of Birth: Dec 04, 1940 Referring Provider (PT): Jenna Luo, MD   Encounter Date: 04/27/2018  PT End of Session - 04/27/18 1055    Visit Number  5    Number of Visits  18    Date for PT Re-Evaluation  05/28/18    PT Start Time  9449    PT Stop Time  1058    PT Time Calculation (min)  43 min    Activity Tolerance  Patient tolerated treatment well    Behavior During Therapy  [facility] for tasks assessed/performed       Past Medical History:  Diagnosis Date  . Anemia   . Arthritis    "hands, feet; shoulders; all over" (02/23/2018)  . Asthma    "as a child; acts up a little sometimes" (02/23/2018)  . Breast cancer, left breast ([place]) 1967  . Cancer of right breast ([place]) 2003  . Depression   . Diverticulosis   . GERD (gastroesophageal reflux disease)   . History of blood transfusion 2003   "several; before my right breast OR"  . History of hiatal hernia   . Hypertension    after weight loss, pt came off medications and has not had it since  . Lymphedema of left arm   . Osteoporosis   . Pneumonia    "once" (02/23/2018)  . Vitamin D deficiency     Past Surgical History:  Procedure Laterality Date  . ABDOMINAL HYSTERECTOMY    . APPENDECTOMY    . BASAL CELL CARCINOMA EXCISION Right    "top of my forehead"  . BREAST BIOPSY Left 1967  . BREAST BIOPSY Right 2003  . CATARACT EXTRACTION W/ INTRAOCULAR LENS  IMPLANT, BILATERAL Bilateral   . CHOLECYSTECTOMY OPEN    . COLONOSCOPY    . JOINT REPLACEMENT    . MASTECTOMY Left 1967  . MASTECTOMY Right 2003  . REVERSE SHOULDER ARTHROPLASTY Left 02/23/2018  . REVERSE SHOULDER ARTHROPLASTY Left 02/23/2018   Procedure: REVERSE LEFT SHOULDER ARTHROPLASTY;  Surgeon: Netta Cedars, MD;  Location: [place];   Service: Orthopedics;  Laterality: Left;  . TONSILLECTOMY    . TOTAL KNEE ARTHROPLASTY Bilateral     There were no vitals filed for this visit.  Subjective Assessment - 04/27/18 1015    Subjective  bandages lasted until last night and then took it off to take a shower.      Pertinent History  Lt breast cancer in 1967 with mastectomy with chemotherapy and radiation, Rt breast cancer in 2003 with Rt mastectomy with radiation with antiestrogens, HTN, osteoporosis, hx of skin cancer, L reverse total shoulder 2019, B TKR     Patient Stated Goals  decrease swelling in the arm    Currently in Pain?  No/denies            LYMPHEDEMA/ONCOLOGY QUESTIONNAIRE - 04/27/18 1019      Left Upper Extremity Lymphedema   10 cm Proximal to Olecranon Process  38.9 cm    Olecranon Process  33 cm    10 cm Proximal to Ulnar Styloid Process  24.9 cm    Just Proximal to Ulnar Styloid Process  19 cm    Across Hand at [employer]  19.2 cm    At [place] of 2nd Digit  6.4 cm  [place] Adult PT Treatment/Exercise - 04/27/18 0001      Manual Therapy   Manual Therapy  Passive ROM    Manual Lymphatic Drainage (MLD)  In Supine: Short neck, superfical and deep abdominals, Lt inguinal nodes, then Lt UE from lateral upper arm to dorsal hand working from proximal to distal then retracing all steps.     Compression Bandaging  Lotion applied to left UE; thin stockinette on entire arm; elastomull x1 on fingers 1-4 with paper tape covering; Artiflexon left UE from hand to axilla; 1-6 cm, 2-10 cm (second 10 in herring bone pattern from wrist to elbow for increased pressure to increased circumference), and 1-12 cm Comprilan short stretch compression bandage on left UE from hand to axilla.     Passive ROM  to the Lt shoulder after MLD into flexion, ABD and ER due to shoulder replacement status                  PT Long Term Goals - 04/27/18 1059      PT LONG TERM GOAL #1   Title  Pt will  have an understanding of lymphedema precautions and risk reduction strategies    Status  On-going      PT LONG TERM GOAL #2   Title  Pt will reduce the Lt UE maximally with transition to appropriate daytime and nighttime garments to prevent reoccurence    Status  On-going      PT LONG TERM GOAL #3   Title  Pt will be independent with HEP for Lt shoulder mobility to improve lymphatic circulation    Status  On-going      PT LONG TERM GOAL #4   Title  Pt will be educated on self MLD for the UE    Status  On-going            Plan - 04/27/18 1055    Clinical Impression Statement  Lower arm and hand measurements have decreased by at least 3cm but the upper arm remains the same.  Pt is very pleased with the progress.      PT Frequency  3x / week    PT Duration  6 weeks    PT Treatment/Interventions  ADLs/Self Care Home Management;Therapeutic exercise;Manual techniques;Manual lymph drainage;Passive range of motion;Compression bandaging    PT Next Visit Plan  continue CDT Lt UE, does pt want to learn MLD? will set up at a special place for day and night garments through [employer] when ready        Patient will benefit from skilled therapeutic intervention in order to improve the following deficits and impairments:  Decreased mobility, Decreased skin integrity, Decreased range of motion, Increased edema, Decreased activity tolerance, Impaired UE functional use  Visit Diagnosis: Postmastectomy lymphedema  Stiffness of left shoulder, not elsewhere classified  Abnormal posture     Problem List Patient Active Problem List   Diagnosis Date Noted  . H/O total shoulder replacement, left 02/23/2018  . Lymphedema of left arm   . Hemorrhoids 09/08/2015  . Obesity 09/08/2015  . OA (osteoarthritis) 05/19/2014  . Vitamin D deficiency   . Diverticulosis   . Primary malignant neoplasm of breast with stage 2 nodal metastasis per American joint committee on cancer 7th edition (n2), right [facility])      Shan Levans, PT 04/27/2018, 11:01 AM  [place], [place], [address] Phone: [phone]   Fax:  [phone]  Name: AVON MOLOCK MRN:  [id number] Date of Birth: 03-13-1941

## 2018-04-30 ENCOUNTER — Ambulatory Visit: Payer: PPO

## 2018-04-30 DIAGNOSIS — I972 Postmastectomy lymphedema syndrome: Secondary | ICD-10-CM

## 2018-04-30 DIAGNOSIS — M25612 Stiffness of left shoulder, not elsewhere classified: Secondary | ICD-10-CM

## 2018-04-30 DIAGNOSIS — R293 Abnormal posture: Secondary | ICD-10-CM

## 2018-04-30 NOTE — Therapy (Signed)
[place], [place], [address] Phone: [phone]   Fax:  [phone]  Physical Therapy Treatment  Patient Details  Name: [name] MRN: [id number] Date of Birth: 07/14/1941 Referring Provider (PT): Jenna Luo, MD   Encounter Date: 04/30/2018  PT End of Session - 04/30/18 1102    Visit Number  6    Number of Visits  18    Date for PT Re-Evaluation  05/28/18    PT Start Time  1022    PT Stop Time  1101    PT Time Calculation (min)  39 min    Activity Tolerance  Patient tolerated treatment well    Behavior During Therapy  [facility] for tasks assessed/performed       Past Medical History:  Diagnosis Date  . Anemia   . Arthritis    "hands, feet; shoulders; all over" (02/23/2018)  . Asthma    "as a child; acts up a little sometimes" (02/23/2018)  . Breast cancer, left breast ([place]) 1967  . Cancer of right breast ([place]) 2003  . Depression   . Diverticulosis   . GERD (gastroesophageal reflux disease)   . History of blood transfusion 2003   "several; before my right breast OR"  . History of hiatal hernia   . Hypertension    after weight loss, pt came off medications and has not had it since  . Lymphedema of left arm   . Osteoporosis   . Pneumonia    "once" (02/23/2018)  . Vitamin D deficiency     Past Surgical History:  Procedure Laterality Date  . ABDOMINAL HYSTERECTOMY    . APPENDECTOMY    . BASAL CELL CARCINOMA EXCISION Right    "top of my forehead"  . BREAST BIOPSY Left 1967  . BREAST BIOPSY Right 2003  . CATARACT EXTRACTION W/ INTRAOCULAR LENS  IMPLANT, BILATERAL Bilateral   . CHOLECYSTECTOMY OPEN    . COLONOSCOPY    . JOINT REPLACEMENT    . MASTECTOMY Left 1967  . MASTECTOMY Right 2003  . REVERSE SHOULDER ARTHROPLASTY Left 02/23/2018  . REVERSE SHOULDER ARTHROPLASTY Left 02/23/2018   Procedure: REVERSE LEFT SHOULDER ARTHROPLASTY;  Surgeon: Netta Cedars, MD;  Location: [facility];   Service: Orthopedics;  Laterality: Left;  . TONSILLECTOMY    . TOTAL KNEE ARTHROPLASTY Bilateral     There were no vitals filed for this visit.  Subjective Assessment - 04/30/18 1028    Subjective  I took my bandages off last night bc my arm was feeling achy and I could tell I was perspiring under it as well ad just wanted it to breath.     Pertinent History  Lt breast cancer in 1967 with mastectomy with chemotherapy and radiation, Rt breast cancer in 2003 with Rt mastectomy with radiation with antiestrogens, HTN, osteoporosis, hx of skin cancer, L reverse total shoulder 2019, B TKR     Patient Stated Goals  decrease swelling in the arm    Currently in Pain?  No/denies                       [facility] Adult PT Treatment/Exercise - 04/30/18 0001      Manual Therapy   Manual Therapy  Passive ROM;Manual Lymphatic Drainage (MLD);Compression Bandaging    Manual Lymphatic Drainage (MLD)  In Supine: Short neck, superfical and deep abdominals, Lt inguinal nodes, then Lt UE from lateral upper arm to dorsal hand working from proximal to distal  then retracing all steps.     Compression Bandaging  Lotion applied to left UE; thin stockinette on entire arm; elastomull x1 on fingers 1-4; Artiflex on left UE from hand to axilla; 1-6 cm, 2-10 cm (second 10 in herring bone pattern), and 1-12 cm Comprilan short stretch compression bandage on left UE from hand to axilla.     Passive ROM  to the Lt shoulder after MLD into flexion, ABD and ER due to shoulder replacement status                  PT Long Term Goals - 04/27/18 1059      PT LONG TERM GOAL #1   Title  Pt will have an understanding of lymphedema precautions and risk reduction strategies    Status  On-going      PT LONG TERM GOAL #2   Title  Pt will reduce the Lt UE maximally with transition to appropriate daytime and nighttime garments to prevent reoccurence    Status  On-going      PT LONG TERM GOAL #3   Title  Pt will  be independent with HEP for Lt shoulder mobility to improve lymphatic circulation    Status  On-going      PT LONG TERM GOAL #4   Title  Pt will be educated on self MLD for the UE    Status  On-going            Plan - 04/30/18 1103    Clinical Impression Statement  Pt had removed bandages yesterday due to discomfort and brought them back today. Her circumference is visibly much reduced from when this therapist saw her last and pt reports noting much improvement as well since start of care. Contined with complete decongestive therapy as pt is tolerating this well.     Rehab Potential  Good    PT Frequency  3x / week    PT Duration  6 weeks    PT Treatment/Interventions  ADLs/Self Care Home Management;Therapeutic exercise;Manual techniques;Manual lymph drainage;Passive range of motion;Compression bandaging    PT Next Visit Plan  continue CDT Lt UE, does pt want to learn MLD? will set up at a special place for day and night garments through alight when ready     Consulted and Agree with Plan of Care  Patient       Patient will benefit from skilled therapeutic intervention in order to improve the following deficits and impairments:  Decreased mobility, Decreased skin integrity, Decreased range of motion, Increased edema, Decreased activity tolerance, Impaired UE functional use  Visit Diagnosis: Postmastectomy lymphedema  Stiffness of left shoulder, not elsewhere classified  Abnormal posture     Problem List Patient Active Problem List   Diagnosis Date Noted  . H/O total shoulder replacement, left 02/23/2018  . Lymphedema of left arm   . Hemorrhoids 09/08/2015  . Obesity 09/08/2015  . OA (osteoarthritis) 05/19/2014  . Vitamin D deficiency   . Diverticulosis   . Primary malignant neoplasm of breast with stage 2 nodal metastasis per American joint committee on cancer 7th edition (n2), right [facility])     Otelia Limes, PTA 04/30/2018, 11:05 AM  [place], [place], [address] Phone: [phone]   Fax:  [phone]  Name: [name] MRN: [id number] Date of Birth: January 15, 1941

## 2018-05-02 ENCOUNTER — Ambulatory Visit: Payer: PPO | Admitting: Rehabilitation

## 2018-05-02 ENCOUNTER — Other Ambulatory Visit: Payer: Self-pay | Admitting: Family Medicine

## 2018-05-04 ENCOUNTER — Encounter: Payer: Self-pay | Admitting: Rehabilitation

## 2018-05-04 ENCOUNTER — Other Ambulatory Visit: Payer: Self-pay | Admitting: Family Medicine

## 2018-05-04 ENCOUNTER — Ambulatory Visit: Payer: PPO | Attending: Family Medicine | Admitting: Rehabilitation

## 2018-05-04 DIAGNOSIS — M25612 Stiffness of left shoulder, not elsewhere classified: Secondary | ICD-10-CM

## 2018-05-04 DIAGNOSIS — R293 Abnormal posture: Secondary | ICD-10-CM

## 2018-05-04 DIAGNOSIS — I972 Postmastectomy lymphedema syndrome: Secondary | ICD-10-CM | POA: Diagnosis not present

## 2018-05-04 MED ORDER — HYDROCODONE-ACETAMINOPHEN 5-325 MG PO TABS: 1.0000 | ORAL_TABLET | Freq: Two times a day (BID) | ORAL | 0 refills | Status: DC | PRN

## 2018-05-04 NOTE — Telephone Encounter (Signed)
Patient is requesting a refill on Hydrocodone   LOV: 03/15/18  LRF:   04/03/18

## 2018-05-04 NOTE — Therapy (Signed)
[place], [place], [address] Phone: [phone]   Fax:  [phone]  Physical Therapy Treatment  Patient Details  Name: [name] MRN: [id number] Date of Birth: [birth year] Referring Provider (PT): Jenna Luo, MD   Encounter Date: 05/04/2018  PT End of Session - 05/04/18 1113    Visit Number  7    Number of Visits  18    Date for PT Re-Evaluation  05/28/18    PT Start Time  1025    PT Stop Time  1110    PT Time Calculation (min)  45 min    Activity Tolerance  Patient tolerated treatment well    Behavior During Therapy  [facility] for tasks assessed/performed       Past Medical History:  Diagnosis Date  . Anemia   . Arthritis    "hands, feet; shoulders; all over" (02/23/2018)  . Asthma    "as a child; acts up a little sometimes" (02/23/2018)  . Breast cancer, left breast ([place]) 1967  . Cancer of right breast ([place]) 2003  . Depression   . Diverticulosis   . GERD (gastroesophageal reflux disease)   . History of blood transfusion 2003   "several; before my right breast OR"  . History of hiatal hernia   . Hypertension    after weight loss, pt came off medications and has not had it since  . Lymphedema of left arm   . Osteoporosis   . Pneumonia    "once" (02/23/2018)  . Vitamin D deficiency     Past Surgical History:  Procedure Laterality Date  . ABDOMINAL HYSTERECTOMY    . APPENDECTOMY    . BASAL CELL CARCINOMA EXCISION Right    "top of my forehead"  . BREAST BIOPSY Left 1967  . BREAST BIOPSY Right 2003  . CATARACT EXTRACTION W/ INTRAOCULAR LENS  IMPLANT, BILATERAL Bilateral   . CHOLECYSTECTOMY OPEN    . COLONOSCOPY    . JOINT REPLACEMENT    . MASTECTOMY Left 1967  . MASTECTOMY Right 2003  . REVERSE SHOULDER ARTHROPLASTY Left 02/23/2018  . REVERSE SHOULDER ARTHROPLASTY Left 02/23/2018   Procedure: REVERSE LEFT SHOULDER ARTHROPLASTY;  Surgeon: Netta Cedars, MD;  Location: [facility];   Service: Orthopedics;  Laterality: Left;  . TONSILLECTOMY    . TOTAL KNEE ARTHROPLASTY Bilateral     There were no vitals filed for this visit.  Subjective Assessment - 05/04/18 1028    Subjective  I think my arm looks good.  Took my bandages off last night.  The Rt shoulder is hurting today     Pertinent History  Lt breast cancer in 1967 with mastectomy with chemotherapy and radiation, Rt breast cancer in 2003 with Rt mastectomy with radiation with antiestrogens, HTN, osteoporosis, hx of skin cancer, L reverse total shoulder 2019, B TKR     Patient Stated Goals  decrease swelling in the arm    Currently in Pain?  --   pain today only in the Rt shoulder after doing too much housework yesterday  5/10           LYMPHEDEMA/ONCOLOGY QUESTIONNAIRE - 05/04/18 1032      Left Upper Extremity Lymphedema   10 cm Proximal to Olecranon Process  39 cm    Olecranon Process  31.8 cm    10 cm Proximal to Ulnar Styloid Process  26 cm    Just Proximal to Ulnar Styloid Process  19.7 cm    Across Hand at  Thumb Web Space  19.4 cm    At [place] of 2nd Digit  6.3 cm                [facility] Adult PT Treatment/Exercise - 05/04/18 0001      Manual Therapy   Manual Lymphatic Drainage (MLD)  In Supine: Short neck, superfical and deep abdominals, Lt inguinal nodes, then Lt UE from lateral upper arm to dorsal hand working from proximal to distal then retracing all steps.     Compression Bandaging  Lotion applied to left UE; thin stockinette on entire arm; elastomull x1 on fingers 1-4; Artiflex on left UE from hand to axilla; 1-6 cm, 2-10 cm (second 10 in herring bone pattern), and 1-12 cm Comprilan short stretch compression bandage on left UE from hand to axilla.                   PT Long Term Goals - 05/04/18 1115      PT LONG TERM GOAL #1   Title  Pt will have an understanding of lymphedema precautions and risk reduction strategies    Status  On-going      PT LONG TERM GOAL #2    Title  Pt will reduce the Lt UE maximally with transition to appropriate daytime and nighttime garments to prevent reoccurence    Status  On-going      PT LONG TERM GOAL #3   Title  Pt will be independent with HEP for Lt shoulder mobility to improve lymphatic circulation    Status  On-going      PT LONG TERM GOAL #4   Title  Pt will be educated on self MLD for the UE    Status  On-going            Plan - 05/04/18 1113    Clinical Impression Statement  Pt with a slight increase in forearm size today, smaller at the olecranon, and about the same in the upper arm with bandages off x 15 hours or so.  We will measure again on Monday and if they are about the same we will set her up with an appt for a special place and need to use [facility]ight paperwork     PT Frequency  3x / week    PT Duration  6 weeks    PT Treatment/Interventions  ADLs/Self Care Home Management;Therapeutic exercise;Manual techniques;Manual lymph drainage;Passive range of motion;Compression bandaging    PT Next Visit Plan  Remeasure and if no sig changes instruct her on how to set up an appt [facility]. Show [facility]ight form to sign.  continue CDT Lt UE, does pt want to learn MLD? will set up at a special place for day and night garments through alight when ready     Consulted and Agree with Plan of Care  Patient       Patient will benefit from skilled therapeutic intervention in order to improve the following deficits and impairments:  Decreased mobility, Decreased skin integrity, Decreased range of motion, Increased edema, Decreased activity tolerance, Impaired UE functional use  Visit Diagnosis: Postmastectomy lymphedema  Stiffness of left shoulder, not elsewhere classified  Abnormal posture     Problem List Patient Active Problem List   Diagnosis Date Noted  . H/O total shoulder replacement, left 02/23/2018  . Lymphedema of left arm   . Hemorrhoids 09/08/2015  . Obesity 09/08/2015  . OA (osteoarthritis)  05/19/2014  . Vitamin D deficiency   . Diverticulosis   .  Primary malignant neoplasm of breast with stage 2 nodal metastasis per American joint committee on cancer 7th edition (n2), right [facility])     Shan Levans, PT 05/04/2018, 11:16 AM  [place], [place], [address] Phone: [phone]   Fax:  [phone]  Name: [name] MRN: [id number] Date of Birth: 12/13/1940

## 2018-05-04 NOTE — Telephone Encounter (Signed)
Refill on hydrocodone to [facility] [address]  °

## 2018-05-07 ENCOUNTER — Ambulatory Visit: Payer: PPO

## 2018-05-07 DIAGNOSIS — I972 Postmastectomy lymphedema syndrome: Secondary | ICD-10-CM | POA: Diagnosis not present

## 2018-05-07 DIAGNOSIS — R293 Abnormal posture: Secondary | ICD-10-CM

## 2018-05-07 DIAGNOSIS — M25612 Stiffness of left shoulder, not elsewhere classified: Secondary | ICD-10-CM

## 2018-05-07 NOTE — Therapy (Signed)
[place], [place], [address] Phone: [phone]   Fax:  [phone]  Physical Therapy Treatment  Patient Details  Name: [name] MRN: [id number] Date of Birth: 1941-07-19 Referring Provider (PT): Jenna Luo, MD   Encounter Date: 05/07/2018  PT End of Session - 05/07/18 1234    Visit Number  8    Number of Visits  18    Date for PT Re-Evaluation  05/28/18    PT Start Time  1027   Pt arrived late   PT Stop Time  1102    PT Time Calculation (min)  35 min    Activity Tolerance  Patient tolerated treatment well    Behavior During Therapy  [facility] for tasks assessed/performed       Past Medical History:  Diagnosis Date  . Anemia   . Arthritis    "hands, feet; shoulders; all over" (02/23/2018)  . Asthma    "as a child; acts up a little sometimes" (02/23/2018)  . Breast cancer, left breast ([place]) 1967  . Cancer of right breast ([place]) 2003  . Depression   . Diverticulosis   . GERD (gastroesophageal reflux disease)   . History of blood transfusion 2003   "several; before my right breast OR"  . History of hiatal hernia   . Hypertension    after weight loss, pt came off medications and has not had it since  . Lymphedema of left arm   . Osteoporosis   . Pneumonia    "once" (02/23/2018)  . Vitamin D deficiency     Past Surgical History:  Procedure Laterality Date  . ABDOMINAL HYSTERECTOMY    . APPENDECTOMY    . BASAL CELL CARCINOMA EXCISION Right    "top of my forehead"  . BREAST BIOPSY Left 1967  . BREAST BIOPSY Right 2003  . CATARACT EXTRACTION W/ INTRAOCULAR LENS  IMPLANT, BILATERAL Bilateral   . CHOLECYSTECTOMY OPEN    . COLONOSCOPY    . JOINT REPLACEMENT    . MASTECTOMY Left 1967  . MASTECTOMY Right 2003  . REVERSE SHOULDER ARTHROPLASTY Left 02/23/2018  . REVERSE SHOULDER ARTHROPLASTY Left 02/23/2018   Procedure: REVERSE LEFT SHOULDER ARTHROPLASTY;  Surgeon: Netta Cedars, MD;   Location: [facility];  Service: Orthopedics;  Laterality: Left;  . TONSILLECTOMY    . TOTAL KNEE ARTHROPLASTY Bilateral     There were no vitals filed for this visit.  Subjective Assessment - 05/07/18 1029    Subjective  I'm sorry I'm running late. My family was over late last night. I took my bandages off to shower.     Pertinent History  Lt breast cancer in 1967 with mastectomy with chemotherapy and radiation, Rt breast cancer in 2003 with Rt mastectomy with radiation with antiestrogens, HTN, osteoporosis, hx of skin cancer, L reverse total shoulder 2019, B TKR     Patient Stated Goals  decrease swelling in the arm    Currently in Pain?  No/denies                       [facility] Adult PT Treatment/Exercise - 05/07/18 0001      Manual Therapy   Manual Lymphatic Drainage (MLD)  In Supine: Short neck, 5 diaphragmatic breaths, Lt inguinal nodes, then Lt UE from lateral upper arm to dorsal hand working from proximal to distal then retracing all steps.     Compression Bandaging  Lotion applied to left UE; thin stockinette on entire  arm; elastomull x1 on fingers 1-4; Artiflex on left UE from hand to axilla; 1-6 cm, 2-10 cm (second 10 in herring bone pattern), and 1-12 cm Comprilan short stretch compression bandage on left UE from hand to axilla.                   PT Long Term Goals - 05/04/18 1115      PT LONG TERM GOAL #1   Title  Pt will have an understanding of lymphedema precautions and risk reduction strategies    Status  On-going      PT LONG TERM GOAL #2   Title  Pt will reduce the Lt UE maximally with transition to appropriate daytime and nighttime garments to prevent reoccurence    Status  On-going      PT LONG TERM GOAL #3   Title  Pt will be independent with HEP for Lt shoulder mobility to improve lymphatic circulation    Status  On-going      PT LONG TERM GOAL #4   Title  Pt will be educated on self MLD for the UE    Status  On-going             Plan - 05/07/18 1235    Clinical Impression Statement  Pt arrived late so shorter session of MLD today. Pts UE seems to be well reduced from when this therapist last saw her. Pt is also pleased with her current reduction and ready for a sleeve but willing to continue bandaging for 1-2 more weeks.     Rehab Potential  Good    PT Frequency  3x / week    PT Duration  6 weeks    PT Treatment/Interventions  ADLs/Self Care Home Management;Therapeutic exercise;Manual techniques;Manual lymph drainage;Passive range of motion;Compression bandaging    PT Next Visit Plan  Remeasure and if no sig changes instruct her on how to set up an appt [facility]. Show [facility]ight form to sign.  continue CDT Lt UE, does pt want to learn MLD? will set up at a special place for day and night garments through alight when ready     Consulted and Agree with Plan of Care  Patient       Patient will benefit from skilled therapeutic intervention in order to improve the following deficits and impairments:  Decreased mobility, Decreased skin integrity, Decreased range of motion, Increased edema, Decreased activity tolerance, Impaired UE functional use  Visit Diagnosis: Postmastectomy lymphedema  Stiffness of left shoulder, not elsewhere classified  Abnormal posture     Problem List Patient Active Problem List   Diagnosis Date Noted  . H/O total shoulder replacement, left 02/23/2018  . Lymphedema of left arm   . Hemorrhoids 09/08/2015  . Obesity 09/08/2015  . OA (osteoarthritis) 05/19/2014  . Vitamin D deficiency   . Diverticulosis   . Primary malignant neoplasm of breast with stage 2 nodal metastasis per American joint committee on cancer 7th edition (n2), right [facility])     Otelia Limes, PTA 05/07/2018, 12:37 PM  [place], [place], [address] Phone: [phone]   Fax:  [phone]  Name: [name] MRN: [id number] Date of Birth: 24-Jun-1941

## 2018-05-08 ENCOUNTER — Other Ambulatory Visit: Payer: Self-pay | Admitting: *Deleted

## 2018-05-08 MED ORDER — ESCITALOPRAM OXALATE 10 MG PO TABS: 10.0000 mg | ORAL_TABLET | Freq: Every day | ORAL | 2 refills | Status: DC

## 2018-05-09 ENCOUNTER — Ambulatory Visit: Payer: PPO

## 2018-05-09 DIAGNOSIS — I972 Postmastectomy lymphedema syndrome: Secondary | ICD-10-CM | POA: Diagnosis not present

## 2018-05-09 DIAGNOSIS — M25612 Stiffness of left shoulder, not elsewhere classified: Secondary | ICD-10-CM

## 2018-05-09 DIAGNOSIS — R293 Abnormal posture: Secondary | ICD-10-CM

## 2018-05-09 NOTE — Therapy (Signed)
[place], [place], [address] Phone: [phone]   Fax:  [phone]  Physical Therapy Treatment  Patient Details  Name: [name] MRN: [id number] Date of Birth: 07/27/1941 Referring Provider (PT): Jenna Luo, MD   Encounter Date: 05/09/2018  PT End of Session - 05/09/18 1301    Visit Number  9    Number of Visits  18    Date for PT Re-Evaluation  05/28/18    PT Start Time  0935    PT Stop Time  1023    PT Time Calculation (min)  48 min    Activity Tolerance  Patient tolerated treatment well    Behavior During Therapy  [facility] for tasks assessed/performed       Past Medical History:  Diagnosis Date  . Anemia   . Arthritis    "hands, feet; shoulders; all over" (02/23/2018)  . Asthma    "as a child; acts up a little sometimes" (02/23/2018)  . Breast cancer, left breast ([place]) 1967  . Cancer of right breast ([place]) 2003  . Depression   . Diverticulosis   . GERD (gastroesophageal reflux disease)   . History of blood transfusion 2003   "several; before my right breast OR"  . History of hiatal hernia   . Hypertension    after weight loss, pt came off medications and has not had it since  . Lymphedema of left arm   . Osteoporosis   . Pneumonia    "once" (02/23/2018)  . Vitamin D deficiency     Past Surgical History:  Procedure Laterality Date  . ABDOMINAL HYSTERECTOMY    . APPENDECTOMY    . BASAL CELL CARCINOMA EXCISION Right    "top of my forehead"  . BREAST BIOPSY Left 1967  . BREAST BIOPSY Right 2003  . CATARACT EXTRACTION W/ INTRAOCULAR LENS  IMPLANT, BILATERAL Bilateral   . CHOLECYSTECTOMY OPEN    . COLONOSCOPY    . JOINT REPLACEMENT    . MASTECTOMY Left 1967  . MASTECTOMY Right 2003  . REVERSE SHOULDER ARTHROPLASTY Left 02/23/2018  . REVERSE SHOULDER ARTHROPLASTY Left 02/23/2018   Procedure: REVERSE LEFT SHOULDER ARTHROPLASTY;  Surgeon: Netta Cedars, MD;  Location: [facility];   Service: Orthopedics;  Laterality: Left;  . TONSILLECTOMY    . TOTAL KNEE ARTHROPLASTY Bilateral     There were no vitals filed for this visit.  Subjective Assessment - 05/09/18 0938    Subjective  I took my bandages off last night to be able to shower. I think my arm is looking really good.     Pertinent History  Lt breast cancer in 1967 with mastectomy with chemotherapy and radiation, Rt breast cancer in 2003 with Rt mastectomy with radiation with antiestrogens, HTN, osteoporosis, hx of skin cancer, L reverse total shoulder 2019, B TKR     Patient Stated Goals  decrease swelling in the arm    Currently in Pain?  No/denies   Rt shoulder arthritis 8/10 with movement           LYMPHEDEMA/ONCOLOGY QUESTIONNAIRE - 05/09/18 0940      Left Upper Extremity Lymphedema   10 cm Proximal to Olecranon Process  35.8 cm    Olecranon Process  28.6 cm    10 cm Proximal to Ulnar Styloid Process  24.9 cm    Just Proximal to Ulnar Styloid Process  18.6 cm    Across Hand at [employer]  18.4 cm  At [facility] of 2nd Digit  5.6 cm                [facility] Adult PT Treatment/Exercise - 05/09/18 0001      Manual Therapy   Manual Lymphatic Drainage (MLD)  In Supine: Short neck, 5 diaphragmatic breaths, Lt inguinal nodes, then Lt UE from lateral upper arm to dorsal hand working from proximal to distal then retracing all steps.     Compression Bandaging  Lotion applied to left UE; thin stockinette on entire arm; elastomull x1 on fingers 1-4; Artiflex on left UE from hand to axilla; 1-6 cm, 2-10 cm (second 10 in herring bone pattern), and 1-12 cm Comprilan short stretch compression bandage on left UE from hand to axilla.                   PT Long Term Goals - 05/04/18 1115      PT LONG TERM GOAL #1   Title  Pt will have an understanding of lymphedema precautions and risk reduction strategies    Status  On-going      PT LONG TERM GOAL #2   Title  Pt will reduce the Lt UE maximally  with transition to appropriate daytime and nighttime garments to prevent reoccurence    Status  On-going      PT LONG TERM GOAL #3   Title  Pt will be independent with HEP for Lt shoulder mobility to improve lymphatic circulation    Status  On-going      PT LONG TERM GOAL #4   Title  Pt will be educated on self MLD for the UE    Status  On-going            Plan - 05/09/18 1302    Clinical Impression Statement  Discussed with pt if she feels ready to be measured for new compression sleeve as her circumference measurements have reduced well and she is eager to do this. So issued form to her to take to [facility] to bill [employer] for a flat knit sleeve and glove.     Rehab Potential  Good    PT Frequency  3x / week    PT Duration  6 weeks    PT Treatment/Interventions  ADLs/Self Care Home Management;Therapeutic exercise;Manual techniques;Manual lymph drainage;Passive range of motion;Compression bandaging    PT Next Visit Plan  Able to make appt at [facility] to be measured for garments? (make copy of pts form as I forgot today but pt is bringing back Friday); cont complete decongestive therapy to Rt UE until pt receives new garments    Consulted and Agree with Plan of Care  Patient       Patient will benefit from skilled therapeutic intervention in order to improve the following deficits and impairments:  Decreased mobility, Decreased skin integrity, Decreased range of motion, Increased edema, Decreased activity tolerance, Impaired UE functional use  Visit Diagnosis: Postmastectomy lymphedema  Stiffness of left shoulder, not elsewhere classified  Abnormal posture     Problem List Patient Active Problem List   Diagnosis Date Noted  . H/O total shoulder replacement, left 02/23/2018  . Lymphedema of left arm   . Hemorrhoids 09/08/2015  . Obesity 09/08/2015  . OA (osteoarthritis) 05/19/2014  . Vitamin D deficiency   . Diverticulosis   . Primary malignant neoplasm  of breast with stage 2 nodal metastasis per American joint committee on cancer 7th edition (n2), right ([place])  Otelia Limes, PTA 05/09/2018, 1:17 PM  [place], [place], [address] Phone: [phone]   Fax:  [phone]  Name: ENSLIE SAHOTA MRN: [id number] Date of Birth: 08-22-1940

## 2018-05-11 ENCOUNTER — Ambulatory Visit: Payer: PPO | Admitting: Rehabilitation

## 2018-05-11 ENCOUNTER — Encounter: Payer: Self-pay | Admitting: Rehabilitation

## 2018-05-11 DIAGNOSIS — M25612 Stiffness of left shoulder, not elsewhere classified: Secondary | ICD-10-CM

## 2018-05-11 DIAGNOSIS — I972 Postmastectomy lymphedema syndrome: Secondary | ICD-10-CM | POA: Diagnosis not present

## 2018-05-11 DIAGNOSIS — R293 Abnormal posture: Secondary | ICD-10-CM

## 2018-05-11 NOTE — Therapy (Signed)
[place], [place], [address] Phone: [phone]   Fax:  [phone]   Progress Note Reporting Period 04/16/18 to 05/11/18  See note below for Objective Data and Assessment of Progress/Goals.      Physical Therapy Treatment  Patient Details  Name: [name] MRN: [id number] Date of Birth: January 01, 1941 Referring Provider (PT): Jenna Luo, MD   Encounter Date: 05/11/2018  PT End of Session - 05/11/18 1207    Visit Number  10    Number of Visits  18    Date for PT Re-Evaluation  05/28/18    PT Start Time  1026   pt arriving late   PT Stop Time  1100    PT Time Calculation (min)  34 min    Activity Tolerance  Patient tolerated treatment well    Behavior During Therapy  [facility] for tasks assessed/performed       Past Medical History:  Diagnosis Date  . Anemia   . Arthritis    "hands, feet; shoulders; all over" (02/23/2018)  . Asthma    "as a child; acts up a little sometimes" (02/23/2018)  . Breast cancer, left breast ([place]) 1967  . Cancer of right breast ([place]) 2003  . Depression   . Diverticulosis   . GERD (gastroesophageal reflux disease)   . History of blood transfusion 2003   "several; before my right breast OR"  . History of hiatal hernia   . Hypertension    after weight loss, pt came off medications and has not had it since  . Lymphedema of left arm   . Osteoporosis   . Pneumonia    "once" (02/23/2018)  . Vitamin D deficiency     Past Surgical History:  Procedure Laterality Date  . ABDOMINAL HYSTERECTOMY    . APPENDECTOMY    . BASAL CELL CARCINOMA EXCISION Right    "top of my forehead"  . BREAST BIOPSY Left 1967  . BREAST BIOPSY Right 2003  . CATARACT EXTRACTION W/ INTRAOCULAR LENS  IMPLANT, BILATERAL Bilateral   . CHOLECYSTECTOMY OPEN    . COLONOSCOPY    . JOINT REPLACEMENT    . MASTECTOMY Left 1967  . MASTECTOMY Right 2003  . REVERSE SHOULDER ARTHROPLASTY Left  02/23/2018  . REVERSE SHOULDER ARTHROPLASTY Left 02/23/2018   Procedure: REVERSE LEFT SHOULDER ARTHROPLASTY;  Surgeon: Netta Cedars, MD;  Location: [place];  Service: Orthopedics;  Laterality: Left;  . TONSILLECTOMY    . TOTAL KNEE ARTHROPLASTY Bilateral     There were no vitals filed for this visit.  Subjective Assessment - 05/11/18 1024    Subjective  my Rt shoulder is doing well.  I forgot my paper but I will make a copy and bring it back.      Pertinent History  Lt breast cancer in 1967 with mastectomy with chemotherapy and radiation, Rt breast cancer in 2003 with Rt mastectomy with radiation with antiestrogens, HTN, osteoporosis, hx of skin cancer, L reverse total shoulder 2019, B TKR     Patient Stated Goals  decrease swelling in the arm    Currently in Pain?  No/denies                       [facility] Adult PT Treatment/Exercise - 05/11/18 0001      Manual Therapy   Edema Management  [facility]ed [facility] with pt in room and scheduled appt tue at 330pm for fittings.  She knows to  bring her alight paper    Manual Lymphatic Drainage (MLD)  not done today due to time     Compression Bandaging  Lotion applied to left UE; thin stockinette on entire arm; elastomull x1 on fingers 1-4; Artiflex on left UE from hand to axilla; 1-6 cm, 2-10 cm (second 10 in herring bone pattern), and 1-12 cm Comprilan short stretch compression bandage on left UE from hand to axilla.                   PT Long Term Goals - 05/11/18 1210      PT LONG TERM GOAL #1   Title  Pt will have an understanding of lymphedema precautions and risk reduction strategies    Status  On-going      PT LONG TERM GOAL #2   Title  Pt will reduce the Lt UE maximally with transition to appropriate daytime and nighttime garments to prevent reoccurence    Status  On-going      PT LONG TERM GOAL #3   Title  Pt will be independent with HEP for Lt shoulder mobility to improve lymphatic circulation    Status   On-going      PT LONG TERM GOAL #4   Title  Pt will be educated on self MLD for the UE    Status  On-going            Plan - 05/11/18 1207    Clinical Impression Statement  Pt now has appt scheduled for garment fitting tue at 3:30pm. She continues to do well and will continue with wrapping until garments have arrived.      PT Frequency  3x / week    PT Duration  6 weeks    PT Treatment/Interventions  ADLs/Self Care Home Management;Therapeutic exercise;Manual techniques;Manual lymph drainage;Passive range of motion;Compression bandaging    PT Next Visit Plan  cont CDT until garments arrive , did pt bring alight copy back?    Consulted and Agree with Plan of Care  Patient       Patient will benefit from skilled therapeutic intervention in order to improve the following deficits and impairments:  Decreased mobility, Decreased skin integrity, Decreased range of motion, Increased edema, Decreased activity tolerance, Impaired UE functional use  Visit Diagnosis: Postmastectomy lymphedema  Stiffness of left shoulder, not elsewhere classified  Abnormal posture     Problem List Patient Active Problem List   Diagnosis Date Noted  . H/O total shoulder replacement, left 02/23/2018  . Lymphedema of left arm   . Hemorrhoids 09/08/2015  . Obesity 09/08/2015  . OA (osteoarthritis) 05/19/2014  . Vitamin D deficiency   . Diverticulosis   . Primary malignant neoplasm of breast with stage 2 nodal metastasis per American joint committee on cancer 7th edition (n2), right [facility])     Shan Levans, PT 05/11/2018, 12:11 PM  [place], [place], [address] Phone: [phone]   Fax:  [phone]  Name: [name] MRN: [id number] Date of Birth: 12/15/1940

## 2018-05-14 ENCOUNTER — Ambulatory Visit: Payer: PPO

## 2018-05-14 DIAGNOSIS — M25612 Stiffness of left shoulder, not elsewhere classified: Secondary | ICD-10-CM

## 2018-05-14 DIAGNOSIS — R293 Abnormal posture: Secondary | ICD-10-CM

## 2018-05-14 DIAGNOSIS — I972 Postmastectomy lymphedema syndrome: Secondary | ICD-10-CM

## 2018-05-14 NOTE — Therapy (Signed)
[place], [place], [address] Phone: [phone]   Fax:  [phone]  Physical Therapy Treatment  Patient Details  Name: [name] MRN: [id number] Date of Birth: 19-Nov-1940 Referring Provider (PT): Jenna Luo, MD   Encounter Date: 05/14/2018  PT End of Session - 05/14/18 1106    Visit Number  11    Number of Visits  18    Date for PT Re-Evaluation  05/28/18    PT Start Time  1022    PT Stop Time  1101    PT Time Calculation (min)  39 min    Activity Tolerance  Patient tolerated treatment well    Behavior During Therapy  [facility] for tasks assessed/performed       Past Medical History:  Diagnosis Date  . Anemia   . Arthritis    "hands, feet; shoulders; all over" (02/23/2018)  . Asthma    "as a child; acts up a little sometimes" (02/23/2018)  . Breast cancer, left breast ([place]) 1967  . Cancer of right breast ([place]) 2003  . Depression   . Diverticulosis   . GERD (gastroesophageal reflux disease)   . History of blood transfusion 2003   "several; before my right breast OR"  . History of hiatal hernia   . Hypertension    after weight loss, pt came off medications and has not had it since  . Lymphedema of left arm   . Osteoporosis   . Pneumonia    "once" (02/23/2018)  . Vitamin D deficiency     Past Surgical History:  Procedure Laterality Date  . ABDOMINAL HYSTERECTOMY    . APPENDECTOMY    . BASAL CELL CARCINOMA EXCISION Right    "top of my forehead"  . BREAST BIOPSY Left 1967  . BREAST BIOPSY Right 2003  . CATARACT EXTRACTION W/ INTRAOCULAR LENS  IMPLANT, BILATERAL Bilateral   . CHOLECYSTECTOMY OPEN    . COLONOSCOPY    . JOINT REPLACEMENT    . MASTECTOMY Left 1967  . MASTECTOMY Right 2003  . REVERSE SHOULDER ARTHROPLASTY Left 02/23/2018  . REVERSE SHOULDER ARTHROPLASTY Left 02/23/2018   Procedure: REVERSE LEFT SHOULDER ARTHROPLASTY;  Surgeon: Netta Cedars, MD;  Location: [place];   Service: Orthopedics;  Laterality: Left;  . TONSILLECTOMY    . TOTAL KNEE ARTHROPLASTY Bilateral     There were no vitals filed for this visit.  Subjective Assessment - 05/14/18 1026    Subjective  I have an appt to get measured for my compression garments tomorrow.     Pertinent History  Lt breast cancer in 1967 with mastectomy with chemotherapy and radiation, Rt breast cancer in 2003 with Rt mastectomy with radiation with antiestrogens, HTN, osteoporosis, hx of skin cancer, L reverse total shoulder 2019, B TKR     Patient Stated Goals  decrease swelling in the arm    Currently in Pain?  No/denies                       [facility] Adult PT Treatment/Exercise - 05/14/18 0001      Manual Therapy   Manual Lymphatic Drainage (MLD)  In supine: Short neck, superficial and deep abdominals, Lt inguinal nodes, then Lt Ue from lateral upper arm to dorsal hand working from proximal to distal then retracing all steps.     Compression Bandaging  Lotion applied to left UE; thin stockinette on entire arm; elastomull x1 on fingers 1-4; Artiflex on left UE  from hand to axilla; 1-6 cm, 2-10 cm (second 10 in herring bone pattern), and 1-12 cm Comprilan short stretch compression bandage on left UE from hand to axilla.                   PT Long Term Goals - 05/11/18 1210      PT LONG TERM GOAL #1   Title  Pt will have an understanding of lymphedema precautions and risk reduction strategies    Status  On-going      PT LONG TERM GOAL #2   Title  Pt will reduce the Lt UE maximally with transition to appropriate daytime and nighttime garments to prevent reoccurence    Status  On-going      PT LONG TERM GOAL #3   Title  Pt will be independent with HEP for Lt shoulder mobility to improve lymphatic circulation    Status  On-going      PT LONG TERM GOAL #4   Title  Pt will be educated on self MLD for the UE    Status  On-going            Plan - 05/14/18 1106    Clinical  Impression Statement  Pt gets measured tomorrow for her compression garments. Will continue to bandage until garments arrive. Her skin continues to be healthy and tolerate bandaging well.     Rehab Potential  Good    PT Frequency  3x / week    PT Duration  6 weeks    PT Treatment/Interventions  ADLs/Self Care Home Management;Therapeutic exercise;Manual techniques;Manual lymph drainage;Passive range of motion;Compression bandaging    PT Next Visit Plan  cont CDT until garments arrive , did pt bring alight copy back?    Consulted and Agree with Plan of Care  Patient       Patient will benefit from skilled therapeutic intervention in order to improve the following deficits and impairments:  Decreased mobility, Decreased skin integrity, Decreased range of motion, Increased edema, Decreased activity tolerance, Impaired UE functional use  Visit Diagnosis: Postmastectomy lymphedema  Stiffness of left shoulder, not elsewhere classified  Abnormal posture     Problem List Patient Active Problem List   Diagnosis Date Noted  . H/O total shoulder replacement, left 02/23/2018  . Lymphedema of left arm   . Hemorrhoids 09/08/2015  . Obesity 09/08/2015  . OA (osteoarthritis) 05/19/2014  . Vitamin D deficiency   . Diverticulosis   . Primary malignant neoplasm of breast with stage 2 nodal metastasis per American joint committee on cancer 7th edition (n2), right [facility])     Otelia Limes, PTA 05/14/2018, 11:10 AM  [place], [place], [address] Phone: [phone]   Fax:  [phone]  Name: [name] MRN: [id number] Date of Birth: November 18, 1940

## 2018-05-16 ENCOUNTER — Ambulatory Visit: Payer: PPO

## 2018-05-17 DIAGNOSIS — Z96612 Presence of left artificial shoulder joint: Secondary | ICD-10-CM | POA: Diagnosis not present

## 2018-05-18 ENCOUNTER — Encounter: Payer: Self-pay | Admitting: Rehabilitation

## 2018-05-18 ENCOUNTER — Ambulatory Visit: Payer: PPO | Admitting: Rehabilitation

## 2018-05-18 DIAGNOSIS — M25612 Stiffness of left shoulder, not elsewhere classified: Secondary | ICD-10-CM

## 2018-05-18 DIAGNOSIS — R293 Abnormal posture: Secondary | ICD-10-CM

## 2018-05-18 DIAGNOSIS — I972 Postmastectomy lymphedema syndrome: Secondary | ICD-10-CM | POA: Diagnosis not present

## 2018-05-18 NOTE — Therapy (Signed)
[place], [place], [address] Phone: [phone]   Fax:  [phone]  Physical Therapy Treatment  Patient Details  Name: [name] MRN: [id number] Date of Birth: 1940/11/20 Referring Provider (PT): Jenna Luo, MD   Encounter Date: 05/18/2018  PT End of Session - 05/18/18 1049    Visit Number  12    Number of Visits  18    Date for PT Re-Evaluation  05/28/18    PT Start Time  0932    PT Stop Time  1045   needing to leave to meet someone   PT Time Calculation (min)  30 min    Activity Tolerance  Patient tolerated treatment well    Behavior During Therapy  [facility] for tasks assessed/performed       Past Medical History:  Diagnosis Date  . Anemia   . Arthritis    "hands, feet; shoulders; all over" (02/23/2018)  . Asthma    "as a child; acts up a little sometimes" (02/23/2018)  . Breast cancer, left breast ([place]) 1967  . Cancer of right breast ([place]) 2003  . Depression   . Diverticulosis   . GERD (gastroesophageal reflux disease)   . History of blood transfusion 2003   "several; before my right breast OR"  . History of hiatal hernia   . Hypertension    after weight loss, pt came off medications and has not had it since  . Lymphedema of left arm   . Osteoporosis   . Pneumonia    "once" (02/23/2018)  . Vitamin D deficiency     Past Surgical History:  Procedure Laterality Date  . ABDOMINAL HYSTERECTOMY    . APPENDECTOMY    . BASAL CELL CARCINOMA EXCISION Right    "top of my forehead"  . BREAST BIOPSY Left 1967  . BREAST BIOPSY Right 2003  . CATARACT EXTRACTION W/ INTRAOCULAR LENS  IMPLANT, BILATERAL Bilateral   . CHOLECYSTECTOMY OPEN    . COLONOSCOPY    . JOINT REPLACEMENT    . MASTECTOMY Left 1967  . MASTECTOMY Right 2003  . REVERSE SHOULDER ARTHROPLASTY Left 02/23/2018  . REVERSE SHOULDER ARTHROPLASTY Left 02/23/2018   Procedure: REVERSE LEFT SHOULDER ARTHROPLASTY;  Surgeon:  Netta Cedars, MD;  Location: [place];  Service: Orthopedics;  Laterality: Left;  . TONSILLECTOMY    . TOTAL KNEE ARTHROPLASTY Bilateral     There were no vitals filed for this visit.  Subjective Assessment - 05/18/18 1023    Subjective  They gave me a glove and a sleeve at the store    Pertinent History  Lt breast cancer in 1967 with mastectomy with chemotherapy and radiation, Rt breast cancer in 2003 with Rt mastectomy with radiation with antiestrogens, HTN, osteoporosis, hx of skin cancer, L reverse total shoulder 2019, B TKR     Patient Stated Goals  decrease swelling in the arm    Currently in Pain?  No/denies                       [facility] Adult PT Treatment/Exercise - 05/18/18 0001      Manual Therapy   Edema Management  Pt arrives with a Juzo size III/M 30-83mHg and lymphedivas 30-40 glove.  Helped pt donn the sleeve and glove with education on how to use gloves to push it up instead of pulling at the sleeve.  Called [facility] to add another sleeve and glove to her order and they  will call her to pick it up.               PT Education - 05/18/18 1049    Education Details  on how to use gloves to donn sleeve    Person(s) Educated  Patient    Methods  Explanation;Demonstration    Comprehension  Verbalized understanding          PT Long Term Goals - 05/18/18 1052      PT LONG TERM GOAL #1   Title  Pt will have an understanding of lymphedema precautions and risk reduction strategies    Status  Achieved      PT LONG TERM GOAL #2   Title  Pt will reduce the Lt UE maximally with transition to appropriate daytime and nighttime garments to prevent reoccurence    Status  Achieved      PT LONG TERM GOAL #3   Title  Pt will be independent with HEP for Lt shoulder mobility to improve lymphatic circulation    Status  Deferred      PT LONG TERM GOAL #4   Title  Pt will be educated on self MLD for the UE    Baseline  due to shoulder pain    Status   Deferred            Plan - 05/18/18 1050    Clinical Impression Statement  Bib has successfully completed CDT and now has a sleeve and glove for daytime wear.  Another set was ordered today by the therapist to have an extra.  All goals met or deferred     Consulted and Agree with Plan of Care  Patient       Patient will benefit from skilled therapeutic intervention in order to improve the following deficits and impairments:  Decreased mobility, Decreased skin integrity, Decreased range of motion, Increased edema, Decreased activity tolerance, Impaired UE functional use  Visit Diagnosis: Postmastectomy lymphedema  Stiffness of left shoulder, not elsewhere classified  Abnormal posture     Problem List Patient Active Problem List   Diagnosis Date Noted  . H/O total shoulder replacement, left 02/23/2018  . Lymphedema of left arm   . Hemorrhoids 09/08/2015  . Obesity 09/08/2015  . OA (osteoarthritis) 05/19/2014  . Vitamin D deficiency   . Diverticulosis   . Primary malignant neoplasm of breast with stage 2 nodal metastasis per American joint committee on cancer 7th edition (n2), right [facility])     Stark Bray 05/18/2018, 10:53 AM  [place], [place], [address] Phone: [phone]   Fax:  [phone]  Name: [name] MRN: [id number] Date of Birth: 03/09/1941  PHYSICAL THERAPY DISCHARGE SUMMARY  Visits from Start of Care: 12  Current functional level related to goals / functional outcomes: See above   Remaining deficits: See above   Education / Equipment: See above Plan: Patient agrees to discharge.  Patient goals were partially met. Patient is being discharged due to being pleased with the current functional level.  ?????      Shan Levans, PT

## 2018-05-21 ENCOUNTER — Ambulatory Visit: Payer: PPO

## 2018-05-22 ENCOUNTER — Telehealth: Payer: Self-pay | Admitting: Family Medicine

## 2018-05-22 DIAGNOSIS — J209 Acute bronchitis, unspecified: Secondary | ICD-10-CM

## 2018-05-22 DIAGNOSIS — J441 Chronic obstructive pulmonary disease with (acute) exacerbation: Secondary | ICD-10-CM

## 2018-05-22 MED ORDER — ALBUTEROL SULFATE HFA 108 (90 BASE) MCG/ACT IN AERS: 2.0000 | INHALATION_SPRAY | Freq: Four times a day (QID) | RESPIRATORY_TRACT | 3 refills | Status: DC | PRN

## 2018-05-22 MED ORDER — ALBUTEROL SULFATE (2.5 MG/3ML) 0.083% IN NEBU: 2.5000 mg | INHALATION_SOLUTION | Freq: Four times a day (QID) | RESPIRATORY_TRACT | 1 refills | Status: DC | PRN

## 2018-05-22 NOTE — Telephone Encounter (Signed)
Medication called/sent to requested pharmacy  

## 2018-05-22 NOTE — Telephone Encounter (Signed)
PATIENT CALLING TO GET REFILL ON HER ALBUTEROL AND INHALER CALLED INTO [facility] IF POSSIBLE  [phone]

## 2018-05-23 ENCOUNTER — Ambulatory Visit: Payer: PPO

## 2018-05-24 ENCOUNTER — Ambulatory Visit (INDEPENDENT_AMBULATORY_CARE_PROVIDER_SITE_OTHER): Payer: PPO | Admitting: Family Medicine

## 2018-05-24 ENCOUNTER — Encounter: Payer: Self-pay | Admitting: Family Medicine

## 2018-05-24 VITALS — BP 110/68 | HR 64 | Temp 98.2°F | Resp 18 | Ht 64.0 in | Wt 219.0 lb

## 2018-05-24 DIAGNOSIS — J019 Acute sinusitis, unspecified: Secondary | ICD-10-CM | POA: Diagnosis not present

## 2018-05-24 DIAGNOSIS — I890 Lymphedema, not elsewhere classified: Secondary | ICD-10-CM

## 2018-05-24 DIAGNOSIS — L249 Irritant contact dermatitis, unspecified cause: Secondary | ICD-10-CM | POA: Diagnosis not present

## 2018-05-24 MED ORDER — MOMETASONE FUROATE 0.1 % EX CREA: 1.0000 "application " | TOPICAL_CREAM | Freq: Every day | CUTANEOUS | 0 refills | Status: DC

## 2018-05-24 MED ORDER — FLUTICASONE PROPIONATE 50 MCG/ACT NA SUSP: 2.0000 | Freq: Every day | NASAL | 6 refills | Status: DC

## 2018-05-24 MED ORDER — AZITHROMYCIN 250 MG PO TABS: ORAL_TABLET | ORAL | 0 refills | Status: DC

## 2018-05-24 MED ORDER — LEVOCETIRIZINE DIHYDROCHLORIDE 5 MG PO TABS: 5.0000 mg | ORAL_TABLET | Freq: Every evening | ORAL | 3 refills | Status: DC

## 2018-05-24 NOTE — Progress Notes (Signed)
Subjective:    Patient ID: [name], female    DOB: 1941-04-14, 77 y.o.   MRN: [id number]  HPI  Patient developed a red papular rash on the dorsum of her left forearm.  She suffers from lymphedema and is wearing compression sleeves on that arm.  She called Monday stating that the arm had become erythematous with a fine papular rash.  She has since discontinued wearing the compression sleeve.  On exam today, there are numerous violaceous, purple, polygonal shaped papules on the dorsum of her left arm that are coalescent into a patch.  There is no erythema or warmth or pain.  It almost looks like lichen planus.  However the papules are not as well circumscribed and they are not as raised as I would expect with lichen planus.  She also reports sinus congestion and head pressure in her right frontal and right maxillary sinus that has been present there for a week she reports rhinorrhea and congestion and cough.  She denies any fever Past Medical History:  Diagnosis Date  . Anemia   . Arthritis    "hands, feet; shoulders; all over" (02/23/2018)  . Asthma    "as a child; acts up a little sometimes" (02/23/2018)  . Breast cancer, left breast ([place]) 1967  . Cancer of right breast ([place]) 2003  . Depression   . Diverticulosis   . GERD (gastroesophageal reflux disease)   . History of blood transfusion 2003   "several; before my right breast OR"  . History of hiatal hernia   . Hypertension    after weight loss, pt came off medications and has not had it since  . Lymphedema of left arm   . Osteoporosis   . Pneumonia    "once" (02/23/2018)  . Vitamin D deficiency    Past Surgical History:  Procedure Laterality Date  . ABDOMINAL HYSTERECTOMY    . APPENDECTOMY    . BASAL CELL CARCINOMA EXCISION Right    "top of my forehead"  . BREAST BIOPSY Left 1967  . BREAST BIOPSY Right 2003  . CATARACT EXTRACTION W/ INTRAOCULAR LENS  IMPLANT, BILATERAL Bilateral   . CHOLECYSTECTOMY OPEN    .  COLONOSCOPY    . JOINT REPLACEMENT    . MASTECTOMY Left 1967  . MASTECTOMY Right 2003  . REVERSE SHOULDER ARTHROPLASTY Left 02/23/2018  . REVERSE SHOULDER ARTHROPLASTY Left 02/23/2018   Procedure: REVERSE LEFT SHOULDER ARTHROPLASTY;  Surgeon: Netta Cedars, MD;  Location: [facility];  Service: Orthopedics;  Laterality: Left;  . TONSILLECTOMY    . TOTAL KNEE ARTHROPLASTY Bilateral    Current Outpatient Medications on File Prior to Visit  Medication Sig Dispense Refill  . albuterol (PROAIR HFA) 108 (90 Base) MCG/ACT inhaler Inhale 2 puffs into the lungs every 6 (six) hours as needed for wheezing or shortness of breath. 18 g 3  . albuterol (PROVENTIL) (2.5 MG/3ML) 0.083% nebulizer solution Take 3 mLs (2.5 mg total) by nebulization every 6 (six) hours as needed for wheezing or shortness of breath. 75 mL 1  . diclofenac sodium (VOLTAREN) 1 % GEL APPLY 4 G TOPICALLY 4 TIMES DAILY (Patient taking differently: APPLY 4 G TOPICALLY 4 TIMES DAILY AS NEEDED FOR PAIN.) 100 g 1  . escitalopram (LEXAPRO) 10 MG tablet Take 1 tablet (10 mg total) by mouth daily. 90 tablet 2  . furosemide (LASIX) 40 MG tablet TAKE 1 TABLET BY MOUTH EVERY DAY 30 tablet 0  . gabapentin (NEURONTIN) 100 MG capsule Take two (2)  capsules by mouth at bed time. 180 capsule 3  . HYDROcodone-acetaminophen (NORCO/VICODIN) 5-325 MG tablet Take 1 tablet by mouth 2 (two) times daily as needed. 60 tablet 0  . KLOR-CON M20 20 MEQ tablet TAKE 1 TABLET BY MOUTH EVERY DAY 30 tablet 0  . Multiple Vitamin (MULTIVITAMIN WITH MINERALS) TABS tablet Take 1 tablet by mouth at bedtime. CENTRUM FOR WOMEN 50+    . omeprazole (PRILOSEC) 20 MG capsule TAKE 1 CAPSULE BY MOUTH EVERY DAY 90 capsule 3  . Phenylephrine-Acetaminophen 5-325 MG TABS Take 2 tablets by mouth every 4 (four) hours as needed (for sinus and headaches.). [facility] Sinus plus Headache    . vitamin B-12 (CYANOCOBALAMIN) 1000 MCG tablet Take 1,000 mcg by mouth at bedtime.     No current  facility-administered medications on file prior to visit.    Allergies  Allergen Reactions  . Penicillins Hives, Rash and Other (See Comments)    PATIENT HAS HAD A PCN REACTION WITH IMMEDIATE RASH, FACIAL/TONGUE/THROAT SWELLING, SOB, OR LIGHTHEADEDNESS WITH HYPOTENSION:  #  #  YES  #  #  Has patient had a PCN reaction causing severe rash involving mucus membranes or skin necrosis: No Has patient had a PCN reaction that required hospitalization: No Has patient had a PCN reaction occurring within the last 10 years: No If all of the above answers are "NO", then may proceed with Cephalosporin use.    Social History   Socioeconomic History  . Marital status: Divorced    Spouse name: Not on file  . Number of children: Not on file  . Years of education: Not on file  . Highest education level: Not on file  Occupational History  . Not on file  Social Needs  . Financial resource strain: Not on file  . Food insecurity:    Worry: Not on file    Inability: Not on file  . Transportation needs:    Medical: Not on file    Non-medical: Not on file  Tobacco Use  . Smoking status: Former Smoker    Packs/day: 0.75    Years: 4.00    Pack years: 3.00    Types: Cigarettes    Last attempt to quit: 1958    Years since quitting: 61.8  . Smokeless tobacco: Never Used  Substance and Sexual Activity  . Alcohol use: Not Currently    Alcohol/week: 0.0 standard drinks    Comment: 02/23/2018 "glass of wine 1-2 times/yr; if that"  . Drug use: Never  . Sexual activity: Not Currently  Lifestyle  . Physical activity:    Days per week: Not on file    Minutes per session: Not on file  . Stress: Not on file  Relationships  . Social connections:    Talks on phone: Not on file    Gets together: Not on file    Attends religious service: Not on file    Active member of club or organization: Not on file    Attends meetings of clubs or organizations: Not on file    Relationship status: Not on file  .  Intimate partner violence:    Fear of current or ex partner: Not on file    Emotionally abused: Not on file    Physically abused: Not on file    Forced sexual activity: Not on file  Other Topics Concern  . Not on file  Social History Narrative  . Not on file      Review of Systems  All other  systems reviewed and are negative.      Objective:   Physical Exam  Constitutional: She appears well-developed and well-nourished.  HENT:  Nose: Mucosal edema and rhinorrhea present. Right sinus exhibits maxillary sinus tenderness and frontal sinus tenderness.  Cardiovascular: Normal rate, regular rhythm and normal heart sounds.  No murmur heard. Pulmonary/Chest: Effort normal and breath sounds normal. No respiratory distress. She has no wheezes. She has no rales.  Abdominal: Soft. Bowel sounds are normal. She exhibits no distension. There is no tenderness. There is no rebound.  Musculoskeletal:       Left shoulder: She exhibits decreased range of motion, tenderness, pain and decreased strength.  Skin: Rash noted. Rash is papular.            Assessment & Plan:  Lymphedema of left arm  Irritant dermatitis  Acute non-recurrent sinusitis, unspecified location  I believe the rash is secondary to the lymphedema and likely irritation from wearing the compression stocking.  I recommended that she use Elocon cream once a day on the rash for the next week and then resume her stocking for lymphedema.  I see no evidence of a secondary cellulitis.  I do believe she has rhinosinusitis secondary to allergies.  I recommended a combination of Xyzal and Flonase.  If the congestion is not improving after 5-7 more days, I would recommend trying amoxicillin for sinusitis.  She can certainly get the amoxicillin sooner if she develops pain or fever that is worsening

## 2018-05-25 ENCOUNTER — Ambulatory Visit: Payer: PPO | Admitting: Physical Therapy

## 2018-06-06 ENCOUNTER — Other Ambulatory Visit: Payer: Self-pay | Admitting: Family Medicine

## 2018-06-06 NOTE — Telephone Encounter (Signed)
Patient is requesting a refill on Hydrocodone   LOV: 05/24/18  LRF:   05/04/18

## 2018-06-06 NOTE — Telephone Encounter (Signed)
Patient calling to get refill on her hydrocodone  Please sent to [facility]

## 2018-06-07 MED ORDER — HYDROCODONE-ACETAMINOPHEN 5-325 MG PO TABS: 1.0000 | ORAL_TABLET | Freq: Two times a day (BID) | ORAL | 0 refills | Status: DC | PRN

## 2018-06-20 ENCOUNTER — Ambulatory Visit: Payer: PPO | Admitting: Podiatry

## 2018-07-03 ENCOUNTER — Other Ambulatory Visit: Payer: Self-pay | Admitting: Family Medicine

## 2018-07-03 MED ORDER — HYDROCODONE-ACETAMINOPHEN 5-325 MG PO TABS: 1.0000 | ORAL_TABLET | Freq: Two times a day (BID) | ORAL | 0 refills | Status: DC | PRN

## 2018-07-03 NOTE — Telephone Encounter (Signed)
Patient is requesting a refill on Hydrocodone   LOV: 05/24/18  LRF:   06/07/18 (pt going out of town Friday and would like refill a little early)

## 2018-08-06 ENCOUNTER — Telehealth: Payer: Self-pay | Admitting: Family Medicine

## 2018-08-06 MED ORDER — HYDROCODONE-ACETAMINOPHEN 5-325 MG PO TABS: 1.0000 | ORAL_TABLET | Freq: Two times a day (BID) | ORAL | 0 refills | Status: DC | PRN

## 2018-08-06 NOTE — Telephone Encounter (Signed)
[facility] [address]

## 2018-08-14 DIAGNOSIS — M25511 Pain in right shoulder: Secondary | ICD-10-CM | POA: Diagnosis not present

## 2018-08-14 DIAGNOSIS — Z96612 Presence of left artificial shoulder joint: Secondary | ICD-10-CM | POA: Diagnosis not present

## 2018-08-14 DIAGNOSIS — Z471 Aftercare following joint replacement surgery: Secondary | ICD-10-CM | POA: Diagnosis not present

## 2018-08-23 ENCOUNTER — Telehealth: Payer: Self-pay | Admitting: Podiatry

## 2018-08-23 NOTE — Telephone Encounter (Signed)
Pt called wanting to speak with nurse regarding her refill of Gabapentin. Pt was last seen in office on 03/20/18.

## 2018-08-24 MED ORDER — GABAPENTIN 100 MG PO CAPS: ORAL_CAPSULE | ORAL | 0 refills | Status: DC

## 2018-08-24 NOTE — Telephone Encounter (Signed)
I called pt and she states Dr. Milinda Pointer had told her if her feet hurt very bad, she could take one in the morning and 2 at bedtime, and she has run out. I told pt I would refill for #30 as she had been prescribed, then let Dr. Milinda Pointer decide how he would prescribe.

## 2018-08-24 NOTE — Addendum Note (Signed)
Addended by: Harriett Sine D on: 08/24/2018 01:08 PM   Modules accepted: Orders

## 2018-08-28 MED ORDER — GABAPENTIN 100 MG PO CAPS: ORAL_CAPSULE | ORAL | 3 refills | Status: DC

## 2018-08-28 NOTE — Telephone Encounter (Signed)
Left message informing pt of the refills +additional for the gabapentin.

## 2018-08-28 NOTE — Telephone Encounter (Signed)
If that is working for her then that will be fine.  Go ahead and refill for 90 days +3 RF

## 2018-08-28 NOTE — Addendum Note (Signed)
Addended by: Harriett Sine D on: 08/28/2018 08:43 AM   Modules accepted: Orders

## 2018-09-11 ENCOUNTER — Other Ambulatory Visit: Payer: Self-pay | Admitting: Family Medicine

## 2018-09-11 MED ORDER — HYDROCODONE-ACETAMINOPHEN 5-325 MG PO TABS: 1.0000 | ORAL_TABLET | Freq: Two times a day (BID) | ORAL | 0 refills | Status: DC | PRN

## 2018-09-11 NOTE — Telephone Encounter (Signed)
Patient is requesting a refill on Hydrocodone   LOV:  05/24/18  LRF:   08/06/18

## 2018-09-11 NOTE — Telephone Encounter (Signed)
Pt needs refill on hydrocodone to [facility]

## 2018-09-28 ENCOUNTER — Encounter: Payer: Self-pay | Admitting: Family Medicine

## 2018-09-28 ENCOUNTER — Ambulatory Visit (INDEPENDENT_AMBULATORY_CARE_PROVIDER_SITE_OTHER): Payer: PPO | Admitting: Family Medicine

## 2018-09-28 VITALS — BP 126/72 | HR 60 | Temp 97.6°F | Resp 16 | Ht 64.0 in | Wt 214.2 lb

## 2018-09-28 DIAGNOSIS — J0141 Acute recurrent pansinusitis: Secondary | ICD-10-CM | POA: Diagnosis not present

## 2018-09-28 DIAGNOSIS — B029 Zoster without complications: Secondary | ICD-10-CM

## 2018-09-28 DIAGNOSIS — H5789 Other specified disorders of eye and adnexa: Secondary | ICD-10-CM

## 2018-09-28 MED ORDER — OFLOXACIN 0.3 % OP SOLN: 1.0000 [drp] | Freq: Four times a day (QID) | OPHTHALMIC | 0 refills | Status: DC

## 2018-09-28 MED ORDER — CROMOLYN SODIUM 4 % OP SOLN: 1.0000 [drp] | Freq: Four times a day (QID) | OPHTHALMIC | 0 refills | Status: DC

## 2018-09-28 MED ORDER — VALACYCLOVIR HCL 1 G PO TABS: 1000.0000 mg | ORAL_TABLET | Freq: Three times a day (TID) | ORAL | 0 refills | Status: AC

## 2018-09-28 MED ORDER — AMOXICILLIN-POT CLAVULANATE 875-125 MG PO TABS: 1.0000 | ORAL_TABLET | Freq: Two times a day (BID) | ORAL | 0 refills | Status: AC

## 2018-09-28 MED ORDER — MONTELUKAST SODIUM 10 MG PO TABS: 10.0000 mg | ORAL_TABLET | Freq: Every day | ORAL | 0 refills | Status: DC

## 2018-09-28 MED ORDER — PREDNISONE 20 MG PO TABS: ORAL_TABLET | ORAL | 0 refills | Status: DC

## 2018-09-28 NOTE — Progress Notes (Signed)
Patient ID: [name], female    DOB: Dec 19, 1940, 78 y.o.   MRN: [id number]  PCP: Susy Frizzle, MD  Chief Complaint  Patient presents with  . Rash    Patient in with c/o rash to back that is itching, and pain. Also has c/o Sunday.  . Sinusitis    Patient has c/o nasal congestion, sinus pressure  and eyes watering, a    Subjective:   [name] is a 78 y.o. female, presents to clinic with CC of sinus pain/pressure/drainage and eye congestion pain and redness around right eye.  Rash  This is a new problem. The current episode started yesterday. The problem has been gradually worsening since onset. Associated symptoms include congestion and coughing (wheeze). Pertinent negatives include no sore throat.  Sinusitis  Associated symptoms include chills, congestion, coughing (wheeze), diaphoresis, headaches, sinus pressure and sneezing. Pertinent negatives include no ear pain or sore throat.      Patient Active Problem List   Diagnosis Date Noted  . H/O total shoulder replacement, left 02/23/2018  . Lymphedema of left arm   . Hemorrhoids 09/08/2015  . Obesity 09/08/2015  . OA (osteoarthritis) 05/19/2014  . Vitamin D deficiency   . Diverticulosis   . Primary malignant neoplasm of breast with stage 2 nodal metastasis per American joint committee on cancer 7th edition (n2), right [facility])      Prior to Admission medications   Medication Sig Start Date End Date Taking? Authorizing Provider  albuterol (PROAIR HFA) 108 (90 Base) MCG/ACT inhaler Inhale 2 puffs into the lungs every 6 (six) hours as needed for wheezing or shortness of breath. 05/22/18  Yes Susy Frizzle, MD  albuterol (PROVENTIL) (2.5 MG/3ML) 0.083% nebulizer solution Take 3 mLs (2.5 mg total) by nebulization every 6 (six) hours as needed for wheezing or shortness of breath. 05/22/18  Yes Susy Frizzle, MD  diclofenac sodium (VOLTAREN) 1 % GEL APPLY 4 G TOPICALLY 4 TIMES DAILY Patient taking  differently: APPLY 4 G TOPICALLY 4 TIMES DAILY AS NEEDED FOR PAIN. 01/09/17  Yes Susy Frizzle, MD  escitalopram (LEXAPRO) 10 MG tablet Take 1 tablet (10 mg total) by mouth daily. 05/08/18  Yes Volanda Napoleon, MD  fluticasone (FLONASE) 50 MCG/ACT nasal spray Place 2 sprays into both nostrils daily. 05/24/18  Yes Susy Frizzle, MD  furosemide (LASIX) 40 MG tablet TAKE 1 TABLET BY MOUTH EVERY DAY 05/02/18  Yes Susy Frizzle, MD  gabapentin (NEURONTIN) 100 MG capsule Take two (2) capsules by mouth at bed time. 04/10/18  Yes Hyatt, Max T, DPM  HYDROcodone-acetaminophen (NORCO/VICODIN) 5-325 MG tablet Take 1 tablet by mouth 2 (two) times daily as needed. 09/11/18  Yes Susy Frizzle, MD  levocetirizine (XYZAL) 5 MG tablet Take 1 tablet (5 mg total) by mouth every evening. 05/24/18  Yes Susy Frizzle, MD  mometasone (ELOCON) 0.1 % cream Apply 1 application topically daily. 05/24/18  Yes Susy Frizzle, MD  Multiple Vitamin (MULTIVITAMIN WITH MINERALS) TABS tablet Take 1 tablet by mouth at bedtime. CENTRUM FOR WOMEN 50+   Yes [provider]  omeprazole (PRILOSEC) 20 MG capsule TAKE 1 CAPSULE BY MOUTH EVERY DAY 05/04/18  Yes Susy Frizzle, MD  KLOR-CON M20 20 MEQ tablet TAKE 1 TABLET BY MOUTH EVERY DAY Patient not taking: Reported on 09/28/2018 05/02/18   Susy Frizzle, MD  vitamin B-12 (CYANOCOBALAMIN) 1000 MCG tablet Take 1,000 mcg by mouth at bedtime.  [provider]     Allergies  Allergen Reactions  . Penicillins Hives, Rash and Other (See Comments)    PATIENT HAS HAD A PCN REACTION WITH IMMEDIATE RASH, FACIAL/TONGUE/THROAT SWELLING, SOB, OR LIGHTHEADEDNESS WITH HYPOTENSION:  #  #  YES  #  #  Has patient had a PCN reaction causing severe rash involving mucus membranes or skin necrosis: No Has patient had a PCN reaction that required hospitalization: No Has patient had a PCN reaction occurring within the last 10 years: No If all of the above answers  are "NO", then may proceed with Cephalosporin use.      Family History  Problem Relation Age of Onset  . Asthma Mother   . COPD Mother   . Congestive Heart Failure Father   . Heart disease Father      Social History   Socioeconomic History  . Marital status: Divorced    Spouse name: Not on file  . Number of children: Not on file  . Years of education: Not on file  . Highest education level: Not on file  Occupational History  . Not on file  Social Needs  . Financial resource strain: Not on file  . Food insecurity:    Worry: Not on file    Inability: Not on file  . Transportation needs:    Medical: Not on file    Non-medical: Not on file  Tobacco Use  . Smoking status: Former Smoker    Packs/day: 0.75    Years: 4.00    Pack years: 3.00    Types: Cigarettes    Last attempt to quit: 1958    Years since quitting: 62.2  . Smokeless tobacco: Never Used  Substance and Sexual Activity  . Alcohol use: Not Currently    Alcohol/week: 0.0 standard drinks    Comment: 02/23/2018 "glass of wine 1-2 times/yr; if that"  . Drug use: Never  . Sexual activity: Not Currently  Lifestyle  . Physical activity:    Days per week: Not on file    Minutes per session: Not on file  . Stress: Not on file  Relationships  . Social connections:    Talks on phone: Not on file    Gets together: Not on file    Attends religious service: Not on file    Active member of club or organization: Not on file    Attends meetings of clubs or organizations: Not on file    Relationship status: Not on file  . Intimate partner violence:    Fear of current or ex partner: Not on file    Emotionally abused: Not on file    Physically abused: Not on file    Forced sexual activity: Not on file  Other Topics Concern  . Not on file  Social History Narrative  . Not on file     Review of Systems  Constitutional: Positive for chills and diaphoresis.  HENT: Positive for congestion, sinus pressure and  sneezing. Negative for ear pain and sore throat.   Eyes: Negative.   Respiratory: Positive for cough (wheeze).   Cardiovascular: Negative.   Gastrointestinal: Negative.   Endocrine: Negative.   Genitourinary: Negative.   Musculoskeletal: Negative.   Skin: Positive for rash.  Allergic/Immunologic: Negative.   Neurological: Positive for headaches.  Hematological: Negative.   Psychiatric/Behavioral: Negative.   All other systems reviewed and are negative.      Objective:    Vitals:   09/28/18 1446  BP: 126/72  Pulse:  60  Resp: 16  Temp: 97.6 F (36.4 C)  TempSrc: Oral  SpO2: 98%  Weight: 214 lb 4 oz (97.2 kg)  Height: 5\' 4"  (1.626 m)      Physical Exam Vitals signs and nursing note reviewed.  Constitutional:      General: She is not in acute distress.    Appearance: She is well-developed. She is obese. She is not ill-appearing, toxic-appearing or diaphoretic.  HENT:     Head: Normocephalic and atraumatic.     Right Ear: Hearing, tympanic membrane, ear canal and external ear normal.     Left Ear: Hearing, tympanic membrane, ear canal and external ear normal.     Nose: Congestion and rhinorrhea present.     Right Sinus: Maxillary sinus tenderness and frontal sinus tenderness present.     Left Sinus: Maxillary sinus tenderness and frontal sinus tenderness present.     Comments: Sinus ttp    Mouth/Throat:     Mouth: Mucous membranes are moist. Mucous membranes are not pale.     Pharynx: Uvula midline. Posterior oropharyngeal erythema present. No oropharyngeal exudate or uvula swelling.     Tonsils: No tonsillar abscesses.  Eyes:     General:        Right eye: No discharge.        Left eye: No discharge.     Conjunctiva/sclera: Conjunctivae normal.     Pupils: Pupils are equal, round, and reactive to light.  Neck:     Musculoskeletal: Normal range of motion and neck supple.     Trachea: No tracheal deviation.  Cardiovascular:     Rate and Rhythm: Normal rate and  regular rhythm.     Heart sounds: Normal heart sounds.  Pulmonary:     Effort: Pulmonary effort is normal. No respiratory distress.     Breath sounds: No stridor. No wheezing, rhonchi or rales.  Abdominal:     General: Bowel sounds are normal. There is no distension.     Palpations: Abdomen is soft.  Musculoskeletal: Normal range of motion.  Skin:    General: Skin is warm and dry.     Coloration: Skin is not pale.     Findings: Erythema and rash present.     Comments: Scattered erythematous maculopapular rash to right thoracic back, excoriated, no vesicles  Neurological:     Mental Status: She is alert.     Motor: No abnormal muscle tone.     Coordination: Coordination normal.  Psychiatric:        Behavior: Behavior normal.           Assessment & Plan:      ICD-10-CM   1. Acute recurrent pansinusitis J01.41 montelukast (SINGULAIR) 10 MG tablet    predniSONE (DELTASONE) 20 MG tablet    amoxicillin-clavulanate (AUGMENTIN) 875-125 MG tablet    cromolyn (OPTICROM) 4 % ophthalmic solution  2. Herpes zoster without complication C16.6 valACYclovir (VALTREX) 1000 MG tablet  3. Eye drainage H57.89 cromolyn (OPTICROM) 4 % ophthalmic solution    Sinus symptoms I think there is a large allergic component with itchy watery eyes nasal drainage sneezing profuse nasal drainage.  Will start treating with allergy medications, allergy eyedrops, with any severe pain or worsening with fever patient can start Augmentin.  Rash has a lot of burning and itching onset of symptoms was 3 to 4 days ago the rashes developed over the past couple days there are no vesicles and most of the findings believe are from her scratching and  excoriations -it may be herpes zoster, patient is very confident that it is and it does not cross midline so it may likely be an atypical presentation of it she can try Valtrex she is barely in a window where to be helpful for her symptoms.  Oral steroids to help with severe  itching of rash and severe allergy sx   Delsa Grana, PA-C 09/28/18 3:01 PM

## 2018-09-28 NOTE — Patient Instructions (Signed)
Take valtrex starting today  You can keep taking your Xyzal and add to that singulair.  Benadryl as needed for severe itching.    Recommend doing cool compresses to eyes when they are severely itchy.  Also can get over the counter eye drops for allergies and itching.    If your eye symptoms do not get better with the additional allergy medications, allergy drops and cold compresses, or if your have more thick solid (puss) draining from eyes with any worsening eye pain - the START the antibiotic eye drops, and get seen immediately for follow up.  If you start wheezing or are short of breath, more than occasionally, or if it starts waking you up at night, or your start to use your inhaler more than 2x a day, then START the prescription of steroids (prednisone) for worsening wheeze (aka asthma exacerbations, reactive airway)

## 2018-10-17 ENCOUNTER — Other Ambulatory Visit: Payer: Self-pay | Admitting: Family Medicine

## 2018-10-17 NOTE — Telephone Encounter (Signed)
Patient is requesting a refill on Hydrocodone   LOV: 05/24/18  LRF:  09/11/18

## 2018-10-17 NOTE — Telephone Encounter (Signed)
REFILL ON HYDROCODONE TO [facility]

## 2018-10-18 MED ORDER — HYDROCODONE-ACETAMINOPHEN 5-325 MG PO TABS: 1.0000 | ORAL_TABLET | Freq: Two times a day (BID) | ORAL | 0 refills | Status: DC | PRN

## 2018-10-20 ENCOUNTER — Other Ambulatory Visit: Payer: Self-pay | Admitting: Family Medicine

## 2018-10-20 DIAGNOSIS — J0141 Acute recurrent pansinusitis: Secondary | ICD-10-CM

## 2018-11-20 ENCOUNTER — Other Ambulatory Visit: Payer: Self-pay | Admitting: Family Medicine

## 2018-11-20 DIAGNOSIS — J0141 Acute recurrent pansinusitis: Secondary | ICD-10-CM

## 2018-11-27 ENCOUNTER — Other Ambulatory Visit: Payer: Self-pay | Admitting: Family Medicine

## 2018-11-27 MED ORDER — HYDROCODONE-ACETAMINOPHEN 5-325 MG PO TABS: 1.0000 | ORAL_TABLET | Freq: Two times a day (BID) | ORAL | 0 refills | Status: DC | PRN

## 2018-11-27 NOTE — Telephone Encounter (Signed)
Patient is requesting a refill on Hydrocodone   LOV: 05/24/18  LRF:   10/18/18

## 2018-11-27 NOTE — Telephone Encounter (Signed)
Refill on hydrocodone to [facility]

## 2018-12-25 ENCOUNTER — Other Ambulatory Visit: Payer: Self-pay | Admitting: Family Medicine

## 2018-12-25 ENCOUNTER — Other Ambulatory Visit: Payer: Self-pay

## 2018-12-25 ENCOUNTER — Encounter: Payer: Self-pay | Admitting: Hematology & Oncology

## 2018-12-25 ENCOUNTER — Inpatient Hospital Stay: Payer: PPO | Attending: Hematology & Oncology | Admitting: Hematology & Oncology

## 2018-12-25 ENCOUNTER — Inpatient Hospital Stay: Payer: PPO

## 2018-12-25 VITALS — BP 136/79 | HR 61 | Temp 97.5°F | Resp 18 | Wt 209.0 lb

## 2018-12-25 DIAGNOSIS — Z79899 Other long term (current) drug therapy: Secondary | ICD-10-CM

## 2018-12-25 DIAGNOSIS — C50911 Malignant neoplasm of unspecified site of right female breast: Secondary | ICD-10-CM

## 2018-12-25 DIAGNOSIS — M2550 Pain in unspecified joint: Secondary | ICD-10-CM | POA: Diagnosis not present

## 2018-12-25 DIAGNOSIS — Z853 Personal history of malignant neoplasm of breast: Secondary | ICD-10-CM | POA: Diagnosis not present

## 2018-12-25 DIAGNOSIS — Z9221 Personal history of antineoplastic chemotherapy: Secondary | ICD-10-CM | POA: Diagnosis not present

## 2018-12-25 DIAGNOSIS — I972 Postmastectomy lymphedema syndrome: Secondary | ICD-10-CM | POA: Diagnosis not present

## 2018-12-25 DIAGNOSIS — Z9013 Acquired absence of bilateral breasts and nipples: Secondary | ICD-10-CM | POA: Insufficient documentation

## 2018-12-25 DIAGNOSIS — Z7952 Long term (current) use of systemic steroids: Secondary | ICD-10-CM | POA: Diagnosis not present

## 2018-12-25 DIAGNOSIS — Z791 Long term (current) use of non-steroidal anti-inflammatories (NSAID): Secondary | ICD-10-CM | POA: Diagnosis not present

## 2018-12-25 DIAGNOSIS — J0141 Acute recurrent pansinusitis: Secondary | ICD-10-CM

## 2018-12-25 DIAGNOSIS — C779 Secondary and unspecified malignant neoplasm of lymph node, unspecified: Secondary | ICD-10-CM

## 2018-12-25 LAB — CMP (CANCER CENTER ONLY)
ALT: 12 U/L (ref 0–44)
AST: 20 U/L (ref 15–41)
Albumin: 3.9 g/dL (ref 3.5–5.0)
Alkaline Phosphatase: 59 U/L (ref 38–126)
Anion gap: 8 (ref 5–15)
BUN: 9 mg/dL (ref 8–23)
CO2: 31 mmol/L (ref 22–32)
Calcium: 9.1 mg/dL (ref 8.9–10.3)
Chloride: 103 mmol/L (ref 98–111)
Creatinine: 0.62 mg/dL (ref 0.44–1.00)
GFR, Est AFR Am: >60 mL/min (ref >60)
GFR, Estimated: >60 mL/min (ref >60)
Glucose, Bld: 102 mg/dL — ABNORMAL HIGH (ref 70–99)
Potassium: 4.6 mmol/L (ref 3.5–5.1)
Sodium: 142 mmol/L (ref 135–145)
Total Bilirubin: 0.7 mg/dL (ref 0.3–1.2)
Total Protein: 5.9 g/dL — ABNORMAL LOW (ref 6.5–8.1)

## 2018-12-25 LAB — CBC WITH DIFFERENTIAL (CANCER CENTER ONLY)
Abs Immature Granulocytes: 0.02 10*3/uL (ref 0.00–0.07)
Basophils Absolute: 0.1 10*3/uL (ref 0.0–0.1)
Basophils Relative: 1 %
Eosinophils Absolute: 0.2 10*3/uL (ref 0.0–0.5)
Eosinophils Relative: 3 %
HCT: 44.7 % (ref 36.0–46.0)
Hemoglobin: 14.6 g/dL (ref 12.0–15.0)
Immature Granulocytes: 0 %
Lymphocytes Relative: 25 %
Lymphs Abs: 1.9 10*3/uL (ref 0.7–4.0)
MCH: 33 pg (ref 26.0–34.0)
MCHC: 32.7 g/dL (ref 30.0–36.0)
MCV: 100.9 fL — ABNORMAL HIGH (ref 80.0–100.0)
Monocytes Absolute: 0.6 10*3/uL (ref 0.1–1.0)
Monocytes Relative: 9 %
Neutro Abs: 4.6 10*3/uL (ref 1.7–7.7)
Neutrophils Relative %: 62 %
Platelet Count: 219 10*3/uL (ref 150–400)
RBC: 4.43 MIL/uL (ref 3.87–5.11)
RDW: 12.1 % (ref 11.5–15.5)
WBC Count: 7.5 10*3/uL (ref 4.0–10.5)
nRBC: 0 % (ref 0.0–0.2)

## 2018-12-25 NOTE — Progress Notes (Signed)
Hematology and Oncology Follow Up Visit  JULIANNA VANWAGNER [id number] 05/01/1941 78 y.o. 12/25/2018   Principle Diagnosis:  Stage IIB (T2 N1 M0) ductal carcinoma of the right breast.  Current Therapy:    Observation     Interim History:  Ms.  Barcia is comes in for followup. We last saw her a year ago. She's doing well.   I think she had rotator cuff surgery for the left shoulder.  She may need to have surgery for the right shoulder.  She is to hold off on doing this.  Because of the coronavirus, she basically is at home.  Her family comes to visit.  She really has kept her self isolated.  Otherwise, there really is no problems that she has.  She is had no fever.  She has had no change in bowel or bladder habits.  She is had no leg swelling.  She has had no cough.  There is been no rashes.  Overall, her performance status is ECOG 2.  Medications:  Current Outpatient Medications:  .  albuterol (PROAIR HFA) 108 (90 Base) MCG/ACT inhaler, Inhale 2 puffs into the lungs every 6 (six) hours as needed for wheezing or shortness of breath., Disp: 18 g, Rfl: 3 .  albuterol (PROVENTIL) (2.5 MG/3ML) 0.083% nebulizer solution, Take 3 mLs (2.5 mg total) by nebulization every 6 (six) hours as needed for wheezing or shortness of breath., Disp: 75 mL, Rfl: 1 .  cromolyn (OPTICROM) 4 % ophthalmic solution, Place 1 drop into both eyes 4 (four) times daily., Disp: 10 mL, Rfl: 0 .  diclofenac sodium (VOLTAREN) 1 % GEL, APPLY 4 G TOPICALLY 4 TIMES DAILY (Patient taking differently: APPLY 4 G TOPICALLY 4 TIMES DAILY AS NEEDED FOR PAIN.), Disp: 100 g, Rfl: 1 .  escitalopram (LEXAPRO) 10 MG tablet, Take 1 tablet (10 mg total) by mouth daily., Disp: 90 tablet, Rfl: 2 .  fluticasone (FLONASE) 50 MCG/ACT nasal spray, Place 2 sprays into both nostrils daily., Disp: 16 g, Rfl: 6 .  furosemide (LASIX) 40 MG tablet, TAKE 1 TABLET BY MOUTH EVERY DAY, Disp: 30 tablet, Rfl: 0 .  gabapentin (NEURONTIN) 100 MG capsule,  Take two (2) capsules by mouth at bed time., Disp: 180 capsule, Rfl: 3 .  HYDROcodone-acetaminophen (NORCO/VICODIN) 5-325 MG tablet, Take 1 tablet by mouth 2 (two) times daily as needed., Disp: 60 tablet, Rfl: 0 .  KLOR-CON M20 20 MEQ tablet, TAKE 1 TABLET BY MOUTH EVERY DAY (Patient not taking: Reported on 09/28/2018), Disp: 30 tablet, Rfl: 0 .  levocetirizine (XYZAL) 5 MG tablet, Take 1 tablet (5 mg total) by mouth every evening., Disp: 30 tablet, Rfl: 3 .  mometasone (ELOCON) 0.1 % cream, Apply 1 application topically daily., Disp: 45 g, Rfl: 0 .  montelukast (SINGULAIR) 10 MG tablet, TAKE 1 TABLET BY MOUTH EVERYDAY AT BEDTIME, Disp: 30 tablet, Rfl: 0 .  Multiple Vitamin (MULTIVITAMIN WITH MINERALS) TABS tablet, Take 1 tablet by mouth at bedtime. CENTRUM FOR WOMEN 50+, Disp: , Rfl:  .  ofloxacin (OCUFLOX) 0.3 % ophthalmic solution, Place 1 drop into both eyes 4 (four) times daily., Disp: 5 mL, Rfl: 0 .  omeprazole (PRILOSEC) 20 MG capsule, TAKE 1 CAPSULE BY MOUTH EVERY DAY, Disp: 90 capsule, Rfl: 3 .  predniSONE (DELTASONE) 20 MG tablet, 2 tabs poqday 1-3, 1 tabs poqday 4-6, Disp: 9 tablet, Rfl: 0 .  vitamin B-12 (CYANOCOBALAMIN) 1000 MCG tablet, Take 1,000 mcg by mouth at bedtime., Disp: , Rfl:  Allergies:  Allergies  Allergen Reactions  . Penicillins Hives, Rash and Other (See Comments)    PATIENT HAS HAD A PCN REACTION WITH IMMEDIATE RASH, FACIAL/TONGUE/THROAT SWELLING, SOB, OR LIGHTHEADEDNESS WITH HYPOTENSION:  #  #  YES  #  #  Has patient had a PCN reaction causing severe rash involving mucus membranes or skin necrosis: No Has patient had a PCN reaction that required hospitalization: No Has patient had a PCN reaction occurring within the last 10 years: No If all of the above answers are "NO", then may proceed with Cephalosporin use.     Past Medical History, Surgical history, Social history, and Family History were reviewed and updated.  Review of Systems: Review of Systems   Constitutional: Negative.   HENT: Negative.   Eyes: Negative.   Respiratory: Negative.   Cardiovascular: Negative.   Gastrointestinal: Negative.   Genitourinary: Negative.   Musculoskeletal: Positive for joint pain.  Skin: Negative.   Neurological: Negative.   Endo/Heme/Allergies: Negative.   Psychiatric/Behavioral: Negative.      Physical Exam:  weight is 209 lb (94.8 kg). Her oral temperature is 97.5 F (36.4 C) (abnormal). Her blood pressure is 136/79 and her pulse is 61. Her respiration is 18 and oxygen saturation is 96%.   Physical Exam Vitals signs reviewed.  Constitutional:      Comments: She has bilateral mastectomies.  She has osteo-parotic changes in her back.  She has some kyphosis.  HENT:     Head: Normocephalic and atraumatic.  Eyes:     Pupils: Pupils are equal, round, and reactive to light.  Neck:     Musculoskeletal: Normal range of motion.  Cardiovascular:     Rate and Rhythm: Normal rate and regular rhythm.     Heart sounds: Normal heart sounds.  Pulmonary:     Effort: Pulmonary effort is normal.     Breath sounds: Normal breath sounds.  Abdominal:     General: Bowel sounds are normal.     Palpations: Abdomen is soft.  Musculoskeletal: Normal range of motion.        General: No tenderness or deformity.     Comments: She has severe limitation of range of motion of the left arm.  She has mild lymphedema of the left arm.  Lymphadenopathy:     Cervical: No cervical adenopathy.  Skin:    General: Skin is warm and dry.     Findings: No erythema or rash.  Neurological:     Mental Status: She is alert and oriented to person, place, and time.  Psychiatric:        Behavior: Behavior normal.        Thought Content: Thought content normal.        Judgment: Judgment normal.    .  Lab Results  Component Value Date   WBC 7.5 12/25/2018   HGB 14.6 12/25/2018   HCT 44.7 12/25/2018   MCV 100.9 (H) 12/25/2018   PLT 219 12/25/2018     Chemistry       Component Value Date/Time   NA 142 12/25/2018 1140   NA 139 12/23/2016 1018   K 4.6 12/25/2018 1140   K 4.2 12/23/2016 1018   CL 103 12/25/2018 1140   CO2 31 12/25/2018 1140   CO2 29 12/23/2016 1018   BUN 9 12/25/2018 1140   BUN 8.2 12/23/2016 1018   CREATININE 0.62 12/25/2018 1140   CREATININE 0.56 (L) 03/13/2018 1552   CREATININE 0.7 12/23/2016 1018  Component Value Date/Time   CALCIUM 9.1 12/25/2018 1140   CALCIUM 9.5 12/23/2016 1018   ALKPHOS 59 12/25/2018 1140   ALKPHOS 69 12/23/2016 1018   AST 20 12/25/2018 1140   AST 24 12/23/2016 1018   ALT 12 12/25/2018 1140   ALT 15 12/23/2016 1018   BILITOT 0.7 12/25/2018 1140   BILITOT 0.94 12/23/2016 1018         Impression and Plan: Ms. Lantz is 78 year old white female with a history of stage IIB infiltrating ductal carcinoma the right breast. She did receive some adjuvant chemotherapy. She had chemotherapy that was completed back in June of 2004.  I do not see any evidence of recurrent disease. She now is 17 years out from therapy.  She likes to come back to see [place]. She just feels much more confident in our examining her.  We will go ahead and her back in another year.  I do not see problems with her having another shoulder surgery.   Volanda Napoleon, MD 5/26/20201:30 PM

## 2019-01-23 ENCOUNTER — Other Ambulatory Visit: Payer: Self-pay | Admitting: Family Medicine

## 2019-01-23 NOTE — Telephone Encounter (Signed)
Pt needs refill on hydrocodone to [facility] [address].

## 2019-01-23 NOTE — Telephone Encounter (Signed)
Patient is requesting a refill on Hydrocodone   LOV: 05/24/18  LRF:  11/27/18

## 2019-01-24 MED ORDER — HYDROCODONE-ACETAMINOPHEN 5-325 MG PO TABS: 1.0000 | ORAL_TABLET | Freq: Two times a day (BID) | ORAL | 0 refills | Status: DC | PRN

## 2019-01-30 ENCOUNTER — Other Ambulatory Visit: Payer: Self-pay | Admitting: Hematology & Oncology

## 2019-03-06 ENCOUNTER — Other Ambulatory Visit: Payer: Self-pay

## 2019-03-06 NOTE — Telephone Encounter (Signed)
Requested Prescriptions   Pending Prescriptions Disp Refills  . HYDROcodone-acetaminophen (NORCO/VICODIN) 5-325 MG tablet 60 tablet 0    Sig: Take 1 tablet by mouth 2 (two) times daily as needed.    Last OV 09/28/2018  Last written 01/24/2019

## 2019-03-07 MED ORDER — HYDROCODONE-ACETAMINOPHEN 5-325 MG PO TABS: 1.0000 | ORAL_TABLET | Freq: Two times a day (BID) | ORAL | 0 refills | Status: DC | PRN

## 2019-03-25 DIAGNOSIS — H04223 Epiphora due to insufficient drainage, bilateral lacrimal glands: Secondary | ICD-10-CM | POA: Diagnosis not present

## 2019-03-25 DIAGNOSIS — Z961 Presence of intraocular lens: Secondary | ICD-10-CM | POA: Diagnosis not present

## 2019-03-25 DIAGNOSIS — H40013 Open angle with borderline findings, low risk, bilateral: Secondary | ICD-10-CM | POA: Diagnosis not present

## 2019-03-25 DIAGNOSIS — H04123 Dry eye syndrome of bilateral lacrimal glands: Secondary | ICD-10-CM | POA: Diagnosis not present

## 2019-04-23 ENCOUNTER — Other Ambulatory Visit: Payer: Self-pay | Admitting: Family Medicine

## 2019-04-23 MED ORDER — HYDROCODONE-ACETAMINOPHEN 5-325 MG PO TABS: 1.0000 | ORAL_TABLET | Freq: Two times a day (BID) | ORAL | 0 refills | Status: DC | PRN

## 2019-04-23 NOTE — Telephone Encounter (Signed)
Patient requesting Hydrocodone called into [facility] on [address]  CB# [phone]

## 2019-04-23 NOTE — Telephone Encounter (Signed)
Patient is requesting a refill on Hydrocodone   LOV: 05/24/18  LRF:   03/07/19

## 2019-04-28 ENCOUNTER — Other Ambulatory Visit: Payer: Self-pay | Admitting: Family Medicine

## 2019-05-06 ENCOUNTER — Other Ambulatory Visit: Payer: Self-pay | Admitting: Family Medicine

## 2019-06-07 ENCOUNTER — Other Ambulatory Visit: Payer: Self-pay

## 2019-06-07 MED ORDER — HYDROCODONE-ACETAMINOPHEN 5-325 MG PO TABS: 1.0000 | ORAL_TABLET | Freq: Two times a day (BID) | ORAL | 0 refills | Status: DC | PRN

## 2019-06-07 NOTE — Telephone Encounter (Signed)
Requested Prescriptions   Pending Prescriptions Disp Refills  . HYDROcodone-acetaminophen (NORCO/VICODIN) 5-325 MG tablet 60 tablet 0    Sig: Take 1 tablet by mouth 2 (two) times daily as needed.    Last OV 09/28/2018  Last written 04/23/2019

## 2019-07-16 IMAGING — CT CT SHOULDER*L* W/O CM
2 series · 10 of 14 positions shown, 12 images · non-contrast
Comparison: Radiographs dated 10/25/2017

CLINICAL DATA: Left shoulder pain and decreased range of motion.
Glenohumeral joint arthritis.

EXAM:
CT OF THE UPPER LEFT EXTREMITY WITHOUT CONTRAST
TECHNIQUE: Multidetector CT imaging of the upper left extremity was performed
according to the standard protocol.

[Series 2: shoulder 2.00 br40 s3 ax · axial · 0.39mm/px · z∈[-1136,-996]mm · 5 of 106 slices shown, 7 images]
[im 18/106  soft-tissue]
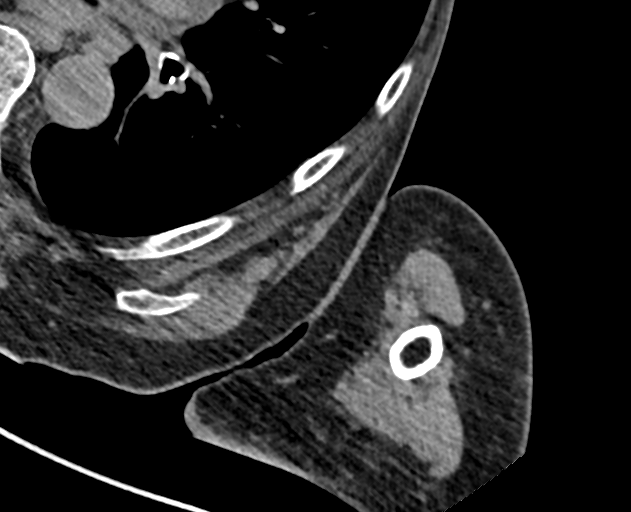
[im 18/106  bone]
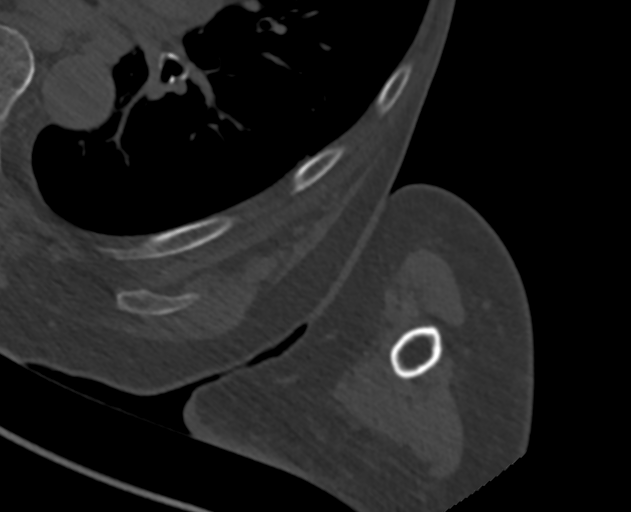
[im 36/106  bone]
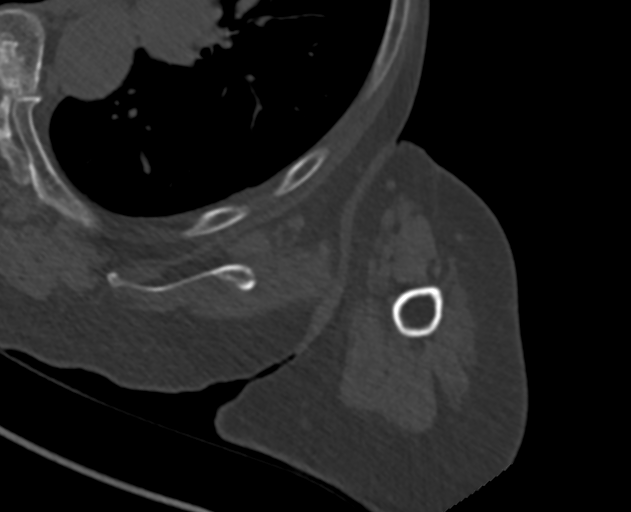
[im 53/106  bone]
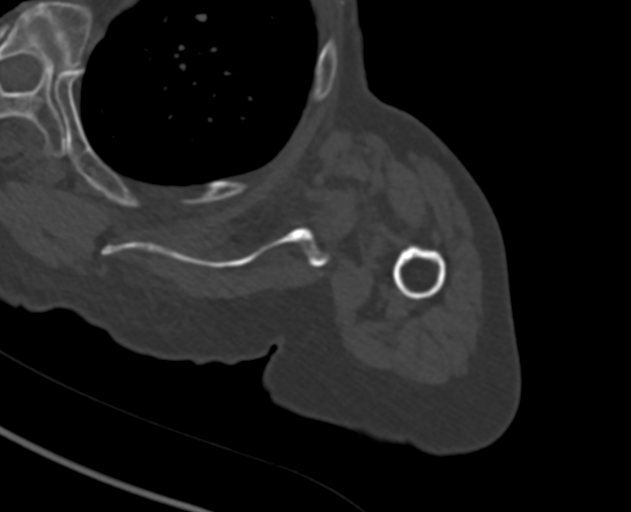
[im 71/106  bone]
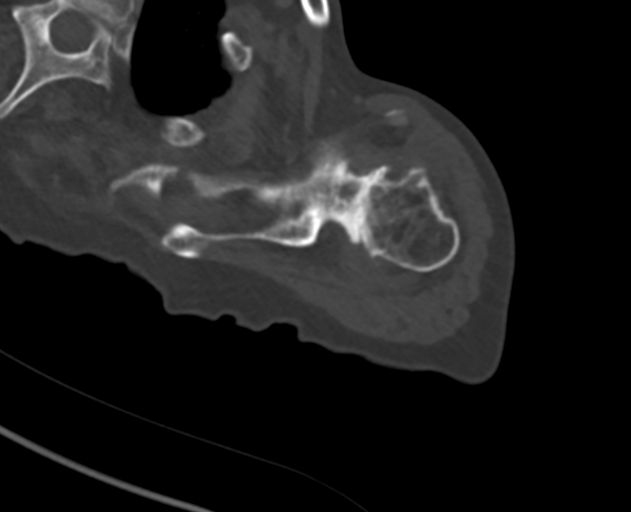
[im 88/106  soft-tissue]
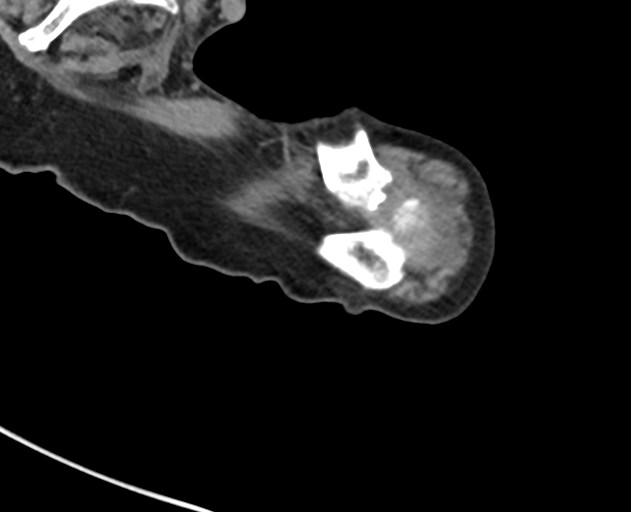
[im 88/106  bone]
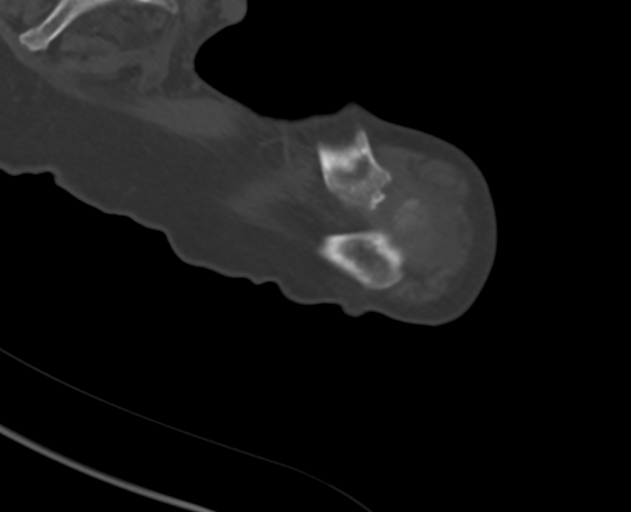

[Series 4: shoulder 2.00 br60 s3 ax · axial · 0.39mm/px · z∈[-1136,-996]mm · 5 of 106 slices shown]
[im 18/106  bone]
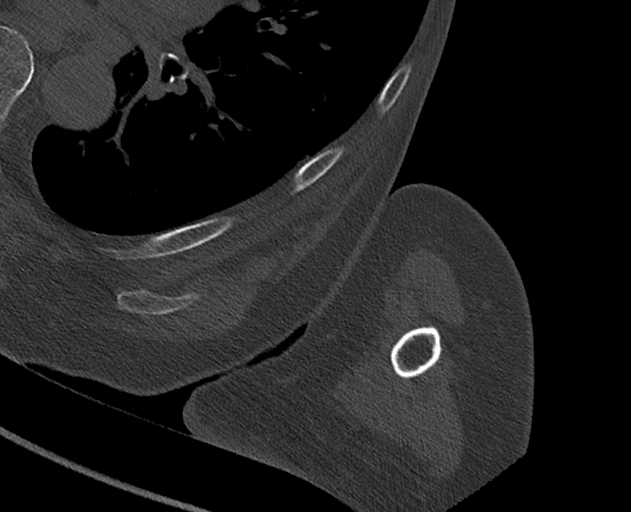
[im 36/106  bone]
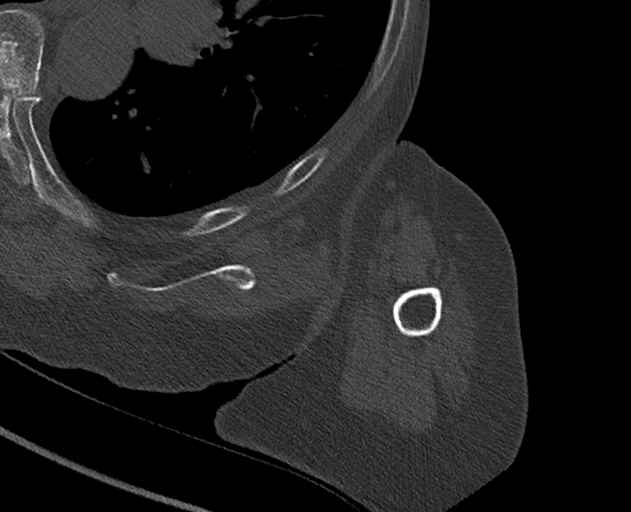
[im 53/106  bone]
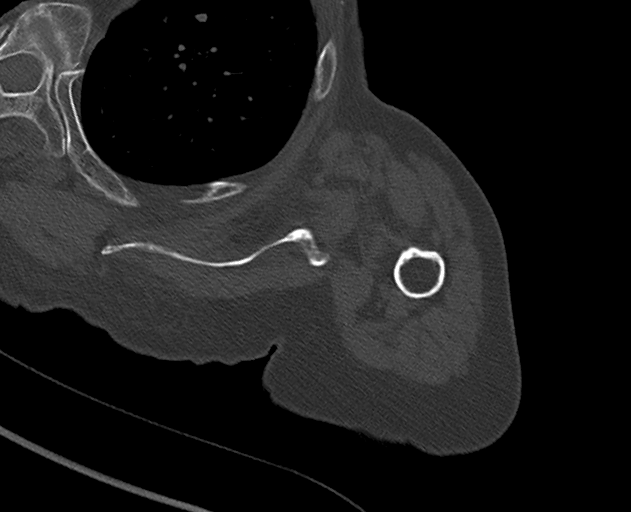
[im 71/106  bone]
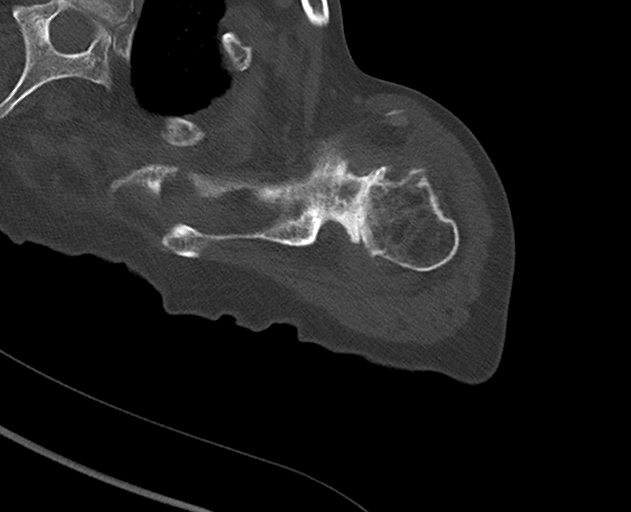
[im 88/106  bone]
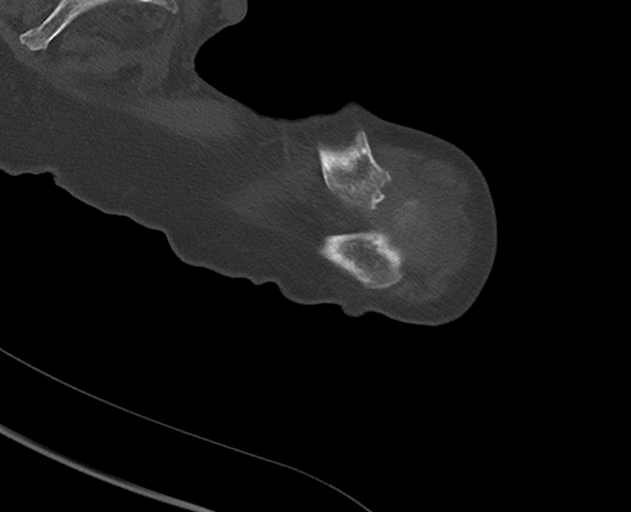

[10 of 14 positions shown; findings below may reference images not displayed]

FINDINGS: Bones/Joint/Cartilage

There is diffuse thinning of the articular cartilage in the
glenohumeral joint with marginal osteophytes on the glenoid and
humeral head. There is a moderate joint effusion. The humeral head
is subluxed superiorly and articulates with the undersurface of the
acromion.

Slight degenerative changes of the distal clavicle.

Muscles and Tendons

Findings are consistent with a chronic full-thickness tear of the
supraspinatus tendon. There is moderate atrophy of all of the
muscles of the rotator cuff.

Soft tissues

No adenopathy. Tiny intrapulmonary node along the major fissure in
the left lung not felt to be of any significance. Large hiatal
hernia.
IMPRESSION: Severe osteoarthritis of the glenohumeral joint.

Joint effusion.

Atrophy of the muscles of the rotator cuff with findings consistent
with complete rotator cuff tear involving the supraspinatus tendon.

## 2019-07-17 ENCOUNTER — Other Ambulatory Visit: Payer: Self-pay

## 2019-07-17 NOTE — Telephone Encounter (Signed)
Requested Prescriptions   Pending Prescriptions Disp Refills  . HYDROcodone-acetaminophen (NORCO/VICODIN) 5-325 MG tablet 60 tablet 0    Sig: Take 1 tablet by mouth 2 (two) times daily as needed.     Last OV 09/28/2018   Last written 06/07/2019

## 2019-07-18 MED ORDER — HYDROCODONE-ACETAMINOPHEN 5-325 MG PO TABS: 1.0000 | ORAL_TABLET | Freq: Two times a day (BID) | ORAL | 0 refills | Status: DC | PRN

## 2019-07-20 ENCOUNTER — Other Ambulatory Visit: Payer: Self-pay | Admitting: Family Medicine

## 2019-07-20 DIAGNOSIS — J0141 Acute recurrent pansinusitis: Secondary | ICD-10-CM

## 2019-08-14 ENCOUNTER — Other Ambulatory Visit: Payer: Self-pay | Admitting: Family Medicine

## 2019-08-14 DIAGNOSIS — J441 Chronic obstructive pulmonary disease with (acute) exacerbation: Secondary | ICD-10-CM

## 2019-08-14 DIAGNOSIS — J209 Acute bronchitis, unspecified: Secondary | ICD-10-CM

## 2019-08-16 ENCOUNTER — Other Ambulatory Visit: Payer: Self-pay | Admitting: Family Medicine

## 2019-09-02 ENCOUNTER — Other Ambulatory Visit: Payer: Self-pay | Admitting: Family Medicine

## 2019-09-02 NOTE — Telephone Encounter (Signed)
Patient asking for refill on hydrocodone  [facility] [address]

## 2019-09-02 NOTE — Telephone Encounter (Signed)
Patient is requesting a refill on Hydrocodone   LOV: 05/24/18  LRF:   07/18/19

## 2019-09-03 MED ORDER — HYDROCODONE-ACETAMINOPHEN 5-325 MG PO TABS: 1.0000 | ORAL_TABLET | Freq: Two times a day (BID) | ORAL | 0 refills | Status: DC | PRN

## 2019-09-16 ENCOUNTER — Other Ambulatory Visit: Payer: Self-pay | Admitting: *Deleted

## 2019-10-12 IMAGING — DX DG SHOULDER 1V*L*
1 series · 1 of 1 positions shown · non-contrast
Comparison: CT 11/27/2017.

CLINICAL DATA: Left shoulder replacement.

EXAM:
LEFT SHOULDER - 1 VIEW

[shoulder axial]
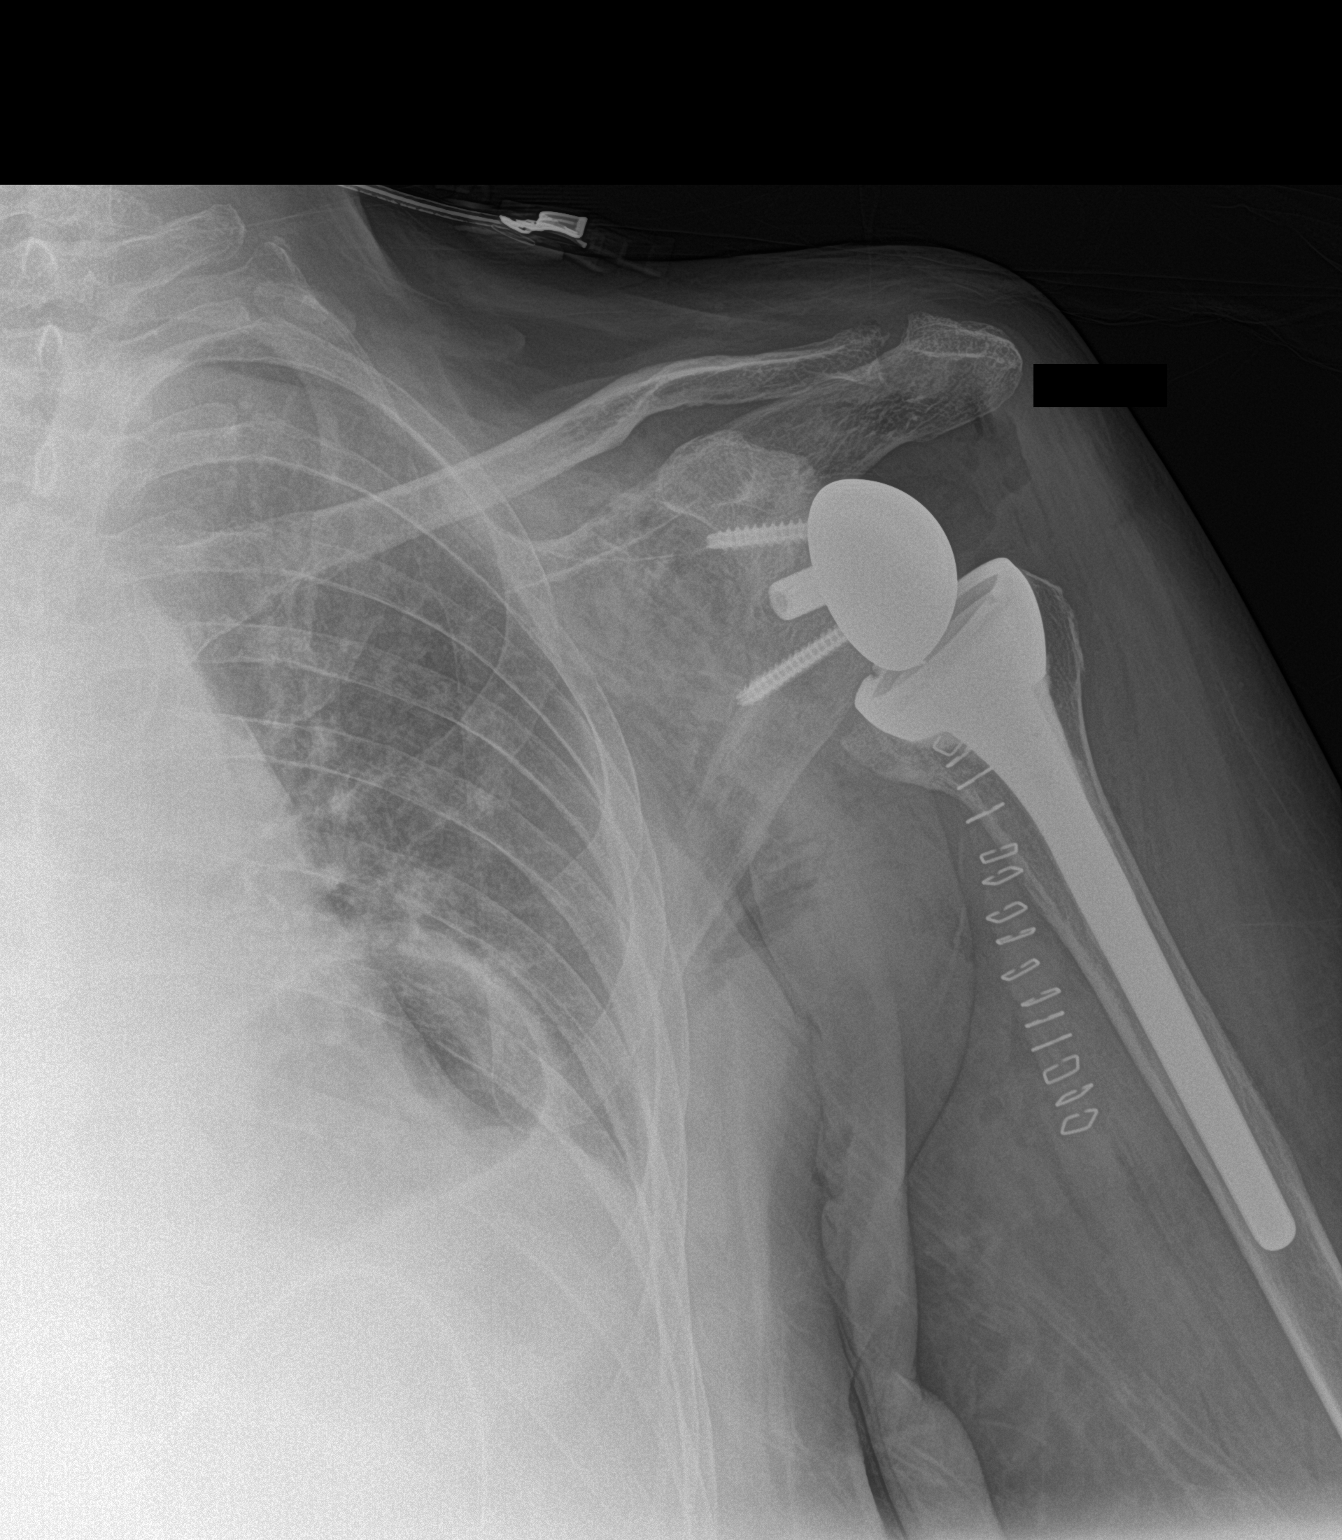

[1 of 1 positions shown; findings below may reference images not displayed]

FINDINGS: Total left shoulder replacement. Hardware intact. Anatomic
alignment.
IMPRESSION: Total left shoulder replacement with anatomic alignment.

## 2019-10-16 ENCOUNTER — Other Ambulatory Visit: Payer: Self-pay | Admitting: Family Medicine

## 2019-10-21 ENCOUNTER — Other Ambulatory Visit: Payer: Self-pay | Admitting: Hematology & Oncology

## 2019-10-24 ENCOUNTER — Other Ambulatory Visit: Payer: Self-pay | Admitting: Family Medicine

## 2019-10-24 MED ORDER — HYDROCODONE-ACETAMINOPHEN 5-325 MG PO TABS: 1.0000 | ORAL_TABLET | Freq: Two times a day (BID) | ORAL | 0 refills | Status: DC | PRN

## 2019-10-24 NOTE — Telephone Encounter (Signed)
Patient calling to get refill on hydrocodone [facility] [address]   

## 2019-10-24 NOTE — Telephone Encounter (Signed)
Patient is requesting a refill on Hydrocodone   LOV: 05/24/18  LRF:   09/03/2019

## 2019-11-29 ENCOUNTER — Other Ambulatory Visit: Payer: Self-pay | Admitting: Family Medicine

## 2019-11-29 MED ORDER — HYDROCODONE-ACETAMINOPHEN 5-325 MG PO TABS: 1.0000 | ORAL_TABLET | Freq: Two times a day (BID) | ORAL | 0 refills | Status: DC | PRN

## 2019-11-29 NOTE — Telephone Encounter (Signed)
Patient is requesting a refill on Hydrocodone   LOV: 05/24/2018  LRF:   10/24/2019

## 2019-11-29 NOTE — Telephone Encounter (Signed)
Patient needs refill on hydrocodone  °[facility] [address]  ° °

## 2019-12-09 ENCOUNTER — Other Ambulatory Visit: Payer: Self-pay | Admitting: Podiatry

## 2019-12-24 ENCOUNTER — Inpatient Hospital Stay: Payer: PPO

## 2019-12-24 ENCOUNTER — Inpatient Hospital Stay: Payer: PPO | Admitting: Hematology & Oncology

## 2020-01-16 ENCOUNTER — Other Ambulatory Visit: Payer: Self-pay | Admitting: Family Medicine

## 2020-01-16 DIAGNOSIS — J0141 Acute recurrent pansinusitis: Secondary | ICD-10-CM

## 2020-01-20 ENCOUNTER — Other Ambulatory Visit: Payer: Self-pay

## 2020-01-20 ENCOUNTER — Telehealth: Payer: Self-pay | Admitting: Family Medicine

## 2020-01-20 NOTE — Telephone Encounter (Signed)
Last refill 11/19/19 

## 2020-01-20 NOTE — Telephone Encounter (Signed)
Patient left vm requesting a refill on her hydrocodone   CB# [phone]

## 2020-01-21 MED ORDER — HYDROCODONE-ACETAMINOPHEN 5-325 MG PO TABS: 1.0000 | ORAL_TABLET | Freq: Two times a day (BID) | ORAL | 0 refills | Status: DC | PRN

## 2020-01-22 ENCOUNTER — Other Ambulatory Visit: Payer: Self-pay | Admitting: *Deleted

## 2020-01-22 DIAGNOSIS — C50911 Malignant neoplasm of unspecified site of right female breast: Secondary | ICD-10-CM

## 2020-01-23 ENCOUNTER — Inpatient Hospital Stay: Payer: PPO

## 2020-01-23 ENCOUNTER — Inpatient Hospital Stay: Payer: PPO | Admitting: Hematology & Oncology

## 2020-01-29 ENCOUNTER — Other Ambulatory Visit: Payer: Self-pay | Admitting: Family Medicine

## 2020-02-17 ENCOUNTER — Ambulatory Visit (INDEPENDENT_AMBULATORY_CARE_PROVIDER_SITE_OTHER): Payer: PPO | Admitting: Nurse Practitioner

## 2020-02-17 ENCOUNTER — Other Ambulatory Visit: Payer: Self-pay

## 2020-02-17 ENCOUNTER — Encounter: Payer: Self-pay | Admitting: Nurse Practitioner

## 2020-02-17 DIAGNOSIS — R7309 Other abnormal glucose: Secondary | ICD-10-CM

## 2020-02-17 DIAGNOSIS — Z0001 Encounter for general adult medical examination with abnormal findings: Secondary | ICD-10-CM

## 2020-02-17 DIAGNOSIS — J0141 Acute recurrent pansinusitis: Secondary | ICD-10-CM | POA: Diagnosis not present

## 2020-02-17 DIAGNOSIS — D519 Vitamin B12 deficiency anemia, unspecified: Secondary | ICD-10-CM

## 2020-02-17 DIAGNOSIS — Z13220 Encounter for screening for lipoid disorders: Secondary | ICD-10-CM

## 2020-02-17 DIAGNOSIS — H5789 Other specified disorders of eye and adnexa: Secondary | ICD-10-CM | POA: Diagnosis not present

## 2020-02-17 DIAGNOSIS — E559 Vitamin D deficiency, unspecified: Secondary | ICD-10-CM | POA: Diagnosis not present

## 2020-02-17 DIAGNOSIS — K219 Gastro-esophageal reflux disease without esophagitis: Secondary | ICD-10-CM | POA: Diagnosis not present

## 2020-02-17 DIAGNOSIS — G629 Polyneuropathy, unspecified: Secondary | ICD-10-CM | POA: Diagnosis not present

## 2020-02-17 DIAGNOSIS — Z13228 Encounter for screening for other metabolic disorders: Secondary | ICD-10-CM

## 2020-02-17 DIAGNOSIS — J441 Chronic obstructive pulmonary disease with (acute) exacerbation: Secondary | ICD-10-CM

## 2020-02-17 DIAGNOSIS — Z0000 Encounter for general adult medical examination without abnormal findings: Secondary | ICD-10-CM

## 2020-02-17 DIAGNOSIS — J209 Acute bronchitis, unspecified: Secondary | ICD-10-CM

## 2020-02-17 NOTE — Patient Instructions (Addendum)
General adult exam completed normal Refills sent for chronic conditions Consider Tetanus and COVID vaccine Blood pressure goals 140/90 or less seek medical attention for out of this range Drink plenty of water Get vitamin D3 supplement Follow low carbohydrate, fat, cholesterol, sugar diet, rich in fiber,  Get 20 minutes of exercise 4 times a week

## 2020-02-17 NOTE — Progress Notes (Signed)
Established Patient Office Visit  Subjective:  Patient ID: [name], female    DOB: 11-Mar-1941  Age: 79 y.o. MRN: [id number]  CC:  Chief Complaint  Patient presents with  . Annual Exam    HPI [name] is a 79 year old female presenting for general anual adult examination. She reports that she is in need of refill on several medications. Pt with h/o mastectomy, cscope 10 years ago asymptomatic, she is not sure date of last Td compelled to get not interested today she will consider and instructed to obtain from local pharmacy, same for COVID vaccine, no falls in past year. Blood pressure, diet, and exercise goals discussed and treatment plan discussed, pt desires/agreed to proceed. No cp, ct, gu/gi sxs, pain, sob, edema, palpitation, or falls.   Past Medical History:  Diagnosis Date  . Anemia   . Arthritis    "hands, feet; shoulders; all over" (02/23/2018)  . Asthma    "as a child; acts up a little sometimes" (02/23/2018)  . Breast cancer, left breast ([place]) 1967  . Cancer of right breast ([place]) 2003  . Depression   . Diverticulosis   . GERD (gastroesophageal reflux disease)   . History of blood transfusion 2003   "several; before my right breast OR"  . History of hiatal hernia   . Hypertension    after weight loss, pt came off medications and has not had it since  . Lymphedema of left arm   . Osteoporosis   . Pneumonia    "once" (02/23/2018)  . Vitamin D deficiency     Past Surgical History:  Procedure Laterality Date  . ABDOMINAL HYSTERECTOMY    . APPENDECTOMY    . BASAL CELL CARCINOMA EXCISION Right    "top of my forehead"  . BREAST BIOPSY Left 1967  . BREAST BIOPSY Right 2003  . CATARACT EXTRACTION W/ INTRAOCULAR LENS  IMPLANT, BILATERAL Bilateral   . CHOLECYSTECTOMY OPEN    . COLONOSCOPY    . JOINT REPLACEMENT    . MASTECTOMY Left 1967  . MASTECTOMY Right 2003  . REVERSE SHOULDER ARTHROPLASTY Left 02/23/2018  . REVERSE SHOULDER ARTHROPLASTY Left  02/23/2018   Procedure: REVERSE LEFT SHOULDER ARTHROPLASTY;  Surgeon: Netta Cedars, MD;  Location: [facility];  Service: Orthopedics;  Laterality: Left;  . TONSILLECTOMY    . TOTAL KNEE ARTHROPLASTY Bilateral     Family History  Problem Relation Age of Onset  . Asthma Mother   . COPD Mother   . Congestive Heart Failure Father   . Heart disease Father     Social History   Socioeconomic History  . Marital status: Divorced    Spouse name: Not on file  . Number of children: Not on file  . Years of education: Not on file  . Highest education level: Not on file  Occupational History  . Not on file  Tobacco Use  . Smoking status: Former Smoker    Packs/day: 0.75    Years: 4.00    Pack years: 3.00    Types: Cigarettes    Quit date: 1958    Years since quitting: 63.5  . Smokeless tobacco: Never Used  Vaping Use  . Vaping Use: Never used  Substance and Sexual Activity  . Alcohol use: Not Currently    Alcohol/week: 0.0 standard drinks    Comment: 02/23/2018 "glass of wine 1-2 times/yr; if that"  . Drug use: Never  . Sexual activity: Not Currently  Other Topics Concern  .  Not on file  Social History Narrative  . Not on file   Social Determinants of Health   Financial Resource Strain:   . Difficulty of Paying Living Expenses:   Food Insecurity:   . Worried About Charity fundraiser in the Last Year:   . Arboriculturist in the Last Year:   Transportation Needs:   . Film/video editor (Medical):   Marland Kitchen Lack of Transportation (Non-Medical):   Physical Activity:   . Days of Exercise per Week:   . Minutes of Exercise per Session:   Stress:   . Feeling of Stress :   Social Connections:   . Frequency of Communication with Friends and Family:   . Frequency of Social Gatherings with Friends and Family:   . Attends Religious Services:   . Active Member of Clubs or Organizations:   . Attends Archivist Meetings:   Marland Kitchen Marital Status:   Intimate Partner Violence:   .  Fear of Current or Ex-Partner:   . Emotionally Abused:   Marland Kitchen Physically Abused:   . Sexually Abused:     Outpatient Medications Prior to Visit  Medication Sig Dispense Refill  . albuterol (PROVENTIL) (2.5 MG/3ML) 0.083% nebulizer solution USE 1 VIAL VIA NEBULIZER EVERY 6 HOURS AS NEEDED FOR WHEEZING OR FOR SHORTNESS OF BREATH 75 mL 1  . escitalopram (LEXAPRO) 10 MG tablet TAKE 1 TABLET BY MOUTH EVERY DAY 90 tablet 2  . fluticasone (FLONASE) 50 MCG/ACT nasal spray SPRAY 2 SPRAYS INTO EACH NOSTRIL EVERY DAY 16 mL 6  . HYDROcodone-acetaminophen (NORCO/VICODIN) 5-325 MG tablet Take 1 tablet by mouth 2 (two) times daily as needed. 60 tablet 0  . levocetirizine (XYZAL) 5 MG tablet TAKE 1 TABLET BY MOUTH EVERY DAY IN THE EVENING 90 tablet 0  . Multiple Vitamin (MULTIVITAMIN WITH MINERALS) TABS tablet Take 1 tablet by mouth at bedtime. CENTRUM FOR WOMEN 50+    . ofloxacin (OCUFLOX) 0.3 % ophthalmic solution Place 1 drop into both eyes 4 (four) times daily. 5 mL 0  . vitamin B-12 (CYANOCOBALAMIN) 1000 MCG tablet Take 1,000 mcg by mouth at bedtime.    . furosemide (LASIX) 40 MG tablet TAKE 1 TABLET BY MOUTH EVERY DAY 30 tablet 0  . KLOR-CON M20 20 MEQ tablet TAKE 1 TABLET BY MOUTH EVERY DAY 30 tablet 0  . mometasone (ELOCON) 0.1 % cream Apply 1 application topically daily. 45 g 0  . montelukast (SINGULAIR) 10 MG tablet TAKE 1 TABLET BY MOUTH EVERYDAY AT BEDTIME 90 tablet 1  . cromolyn (OPTICROM) 4 % ophthalmic solution Place 1 drop into both eyes 4 (four) times daily. 10 mL 0  . diclofenac sodium (VOLTAREN) 1 % GEL APPLY 4 G TOPICALLY 4 TIMES DAILY (Patient taking differently: APPLY 4 G TOPICALLY 4 TIMES DAILY AS NEEDED FOR PAIN.) 100 g 1  . gabapentin (NEURONTIN) 100 MG capsule TAKE 1 CAPSULE BY MOUTH IN THE MORNING AND 2 CAPSULES AT BEDTIME. **PLEASE SCHEDULE APPOINTMENT** 90 capsule 3  . omeprazole (PRILOSEC) 20 MG capsule TAKE 1 CAPSULE BY MOUTH EVERY DAY 90 capsule 3  . predniSONE (DELTASONE) 20 MG  tablet 2 tabs poqday 1-3, 1 tabs poqday 4-6 9 tablet 0  . PROAIR HFA 108 (90 Base) MCG/ACT inhaler TAKE 2 PUFFS BY MOUTH EVERY 6 HOURS AS NEEDED FOR WHEEZE OR SHORTNESS OF BREATH 18 g 3   No facility-administered medications prior to visit.    Allergies  Allergen Reactions  . Penicillins Hives, Rash and  Other (See Comments)    PATIENT HAS HAD A PCN REACTION WITH IMMEDIATE RASH, FACIAL/TONGUE/THROAT SWELLING, SOB, OR LIGHTHEADEDNESS WITH HYPOTENSION:  #  #  YES  #  #  Has patient had a PCN reaction causing severe rash involving mucus membranes or skin necrosis: No Has patient had a PCN reaction that required hospitalization: No Has patient had a PCN reaction occurring within the last 10 years: No If all of the above answers are "NO", then may proceed with Cephalosporin use.     ROS Review of Systems  All other systems reviewed and are negative.     Objective:    Physical Exam Vitals and nursing note reviewed.  Constitutional:      General: She is not in acute distress.    Appearance: Normal appearance. She is well-developed and well-groomed. She is not ill-appearing, toxic-appearing or diaphoretic.  HENT:     Head: Normocephalic.     Right Ear: Hearing, ear canal and external ear normal.     Left Ear: Hearing, ear canal and external ear normal.     Nose: Nose normal. No congestion or rhinorrhea.     Mouth/Throat:     Lips: Pink.     Mouth: Mucous membranes are moist.     Pharynx: Oropharynx is clear. No oropharyngeal exudate or posterior oropharyngeal erythema.  Eyes:     General: Lids are normal. Lids are everted, no foreign bodies appreciated.     Extraocular Movements: Extraocular movements intact.     Conjunctiva/sclera: Conjunctivae normal.     Pupils: Pupils are equal, round, and reactive to light.  Neck:     Thyroid: No thyromegaly.     Vascular: No carotid bruit or JVD.  Cardiovascular:     Rate and Rhythm: Normal rate and regular rhythm.     Pulses: Normal  pulses.     Heart sounds: Normal heart sounds, S1 normal and S2 normal.  Pulmonary:     Effort: Pulmonary effort is normal.     Breath sounds: Normal breath sounds.  Abdominal:     General: Abdomen is flat. Bowel sounds are normal. There is no abdominal bruit.     Palpations: Abdomen is soft. There is no hepatomegaly or splenomegaly.     Tenderness: There is no abdominal tenderness. There is no right CVA tenderness or left CVA tenderness.  Musculoskeletal:        General: Normal range of motion.     Cervical back: Normal, normal range of motion and neck supple.     Thoracic back: Normal.     Lumbar back: Normal.     Right lower leg: Normal. No edema.     Left lower leg: Normal. No edema.  Lymphadenopathy:     Head:     Right side of head: No submental, submandibular or tonsillar adenopathy.     Left side of head: No submental, submandibular or tonsillar adenopathy.     Cervical: No cervical adenopathy.  Skin:    General: Skin is warm.     Capillary Refill: Capillary refill takes less than 2 seconds.     Coloration: Skin is not cyanotic, jaundiced, mottled or pale.     Findings: No bruising or rash.  Neurological:     General: No focal deficit present.     Mental Status: She is alert and oriented to person, place, and time.     GCS: GCS eye subscore is 4. GCS verbal subscore is 5. GCS motor subscore is 6.  Cranial Nerves: Cranial nerves are intact.     Gait: Gait normal.  Psychiatric:        Attention and Perception: Attention and perception normal.        Mood and Affect: Mood and affect normal. Mood is not depressed.        Speech: Speech normal.        Behavior: Behavior normal. Behavior is cooperative.        Thought Content: Thought content normal.        Cognition and Memory: Cognition normal.        Judgment: Judgment normal.     There were no vitals taken for this visit. Wt Readings from Last 3 Encounters:  12/25/18 209 lb (94.8 kg)  09/28/18 214 lb 4 oz (97.2  kg)  05/24/18 219 lb (99.3 kg)     Health Maintenance Due  Topic Date Due  . COVID-19 Vaccine (1) Never done  . TETANUS/TDAP  Never done    There are no preventive care reminders to display for this patient.  No results found for: TSH Lab Results  Component Value Date   WBC 7.1 02/17/2020   HGB 14.6 02/17/2020   HCT 43.6 02/17/2020   MCV 94.8 02/17/2020   PLT 213 02/17/2020   Lab Results  Component Value Date   NA 141 02/17/2020   K 4.8 02/17/2020   CHLORIDE 103 12/23/2016   CO2 31 02/17/2020   GLUCOSE 87 02/17/2020   BUN 11 02/17/2020   CREATININE 0.57 (L) 02/17/2020   BILITOT 0.7 02/17/2020   ALKPHOS 59 12/25/2018   AST 17 02/17/2020   ALT 9 02/17/2020   PROT 6.1 02/17/2020   ALBUMIN 3.9 12/25/2018   CALCIUM 9.6 02/17/2020   ANIONGAP 8 12/25/2018   EGFR 83 (L) 12/23/2016   Lab Results  Component Value Date   CHOL 201 (H) 02/17/2020   Lab Results  Component Value Date   HDL 57 02/17/2020   Lab Results  Component Value Date   LDLCALC 127 (H) 02/17/2020   Lab Results  Component Value Date   TRIG 78 02/17/2020   Lab Results  Component Value Date   CHOLHDL 3.5 02/17/2020   Lab Results  Component Value Date   HGBA1C 5.0 02/17/2020      Assessment & Plan:   Problem List Items Addressed This Visit      Nervous and Auditory   Neuropathy     Other   Vitamin D deficiency   Relevant Orders   VITAMIN D 25 Hydroxy (Vit-D Deficiency, Fractures) (Completed)    Other Visit Diagnoses    Adult general medical exam    -  Primary   Relevant Orders   Hemoglobin A1c (Completed)   Lipid panel (Completed)   CBC with Differential/Platelet (Completed)   COMPLETE METABOLIC PANEL WITH GFR (Completed)   VITAMIN D 25 Hydroxy (Vit-D Deficiency, Fractures) (Completed)   Elevated glucose       Relevant Orders   Hemoglobin A1c (Completed)   Screening, lipid       Relevant Orders   Lipid panel (Completed)   Encounter for screening for other metabolic  disorders       Relevant Orders   CBC with Differential/Platelet (Completed)   COMPLETE METABOLIC PANEL WITH GFR (Completed)   Acute recurrent pansinusitis       Relevant Medications   cromolyn (OPTICROM) 4 % ophthalmic solution   Eye drainage       Relevant Medications   cromolyn (OPTICROM) 4 %  ophthalmic solution   Gastroesophageal reflux disease, unspecified whether esophagitis present       Relevant Medications   omeprazole (PRILOSEC) 20 MG capsule   famotidine (PEPCID) 20 MG tablet   Acute bronchitis, unspecified organism       Relevant Medications   albuterol (PROAIR HFA) 108 (90 Base) MCG/ACT inhaler   COPD exacerbation (HCC)       Relevant Medications   albuterol (PROAIR HFA) 108 (90 Base) MCG/ACT inhaler   Anemia due to vitamin B12 deficiency, unspecified B12 deficiency type       Relevant Orders   Vitamin B12 (Completed)    General adult exam completed normal Refills sent for chronic conditions Consider Tetanus and COVID vaccine Blood pressure goals 140/90 or less seek medical attention for out of this range Drink plenty of water Get vitamin D3 supplement Follow low carbohydrate, fat, cholesterol, sugar diet, rich in fiber,  Get 20 minutes of exercise 4 times a week  Meds ordered this encounter  Medications  . cromolyn (OPTICROM) 4 % ophthalmic solution    Sig: Place 1 drop into both eyes 4 (four) times daily.    Dispense:  10 mL    Refill:  0  . gabapentin (NEURONTIN) 100 MG capsule    Sig: TAKE 1 CAPSULE BY MOUTH IN THE MORNING AND 2 CAPSULES AT BEDTIME.    Dispense:  90 capsule    Refill:  3  . omeprazole (PRILOSEC) 20 MG capsule    Sig: Take 1 capsule (20 mg total) by mouth daily.    Dispense:  90 capsule    Refill:  3  . famotidine (PEPCID) 20 MG tablet    Sig: Take 1 tablet (20 mg total) by mouth 2 (two) times daily as needed for heartburn or indigestion.    Dispense:  60 tablet    Refill:  3  . albuterol (PROAIR HFA) 108 (90 Base) MCG/ACT inhaler     Sig: TAKE 2 PUFFS BY MOUTH EVERY 6 HOURS AS NEEDED FOR WHEEZE OR SHORTNESS OF BREATH    Dispense:  18 g    Refill:  3    DX Code Needed  .    Follow-up: Return in about 6 months (around 08/19/2020).    Annie Main, FNP

## 2020-02-18 LAB — COMPLETE METABOLIC PANEL WITH GFR
AG Ratio: 1.5 (calc) (ref 1.0–2.5)
ALT: 9 U/L (ref 6–29)
AST: 17 U/L (ref 10–35)
Albumin: 3.7 g/dL (ref 3.6–5.1)
Alkaline phosphatase (APISO): 62 U/L (ref 37–153)
BUN/Creatinine Ratio: 19 (calc) (ref 6–22)
BUN: 11 mg/dL (ref 7–25)
CO2: 31 mmol/L (ref 20–32)
Calcium: 9.6 mg/dL (ref 8.6–10.4)
Chloride: 104 mmol/L (ref 98–110)
Creat: 0.57 mg/dL — ABNORMAL LOW (ref 0.60–0.88)
GFR, Est African American: 101 mL/min/{1.73_m2} (ref >=60)
GFR, Est Non African American: 87 mL/min/{1.73_m2} (ref >=60)
Globulin: 2.4 g/dL (calc) (ref 1.9–3.7)
Glucose, Bld: 87 mg/dL (ref 65–99)
Potassium: 4.8 mmol/L (ref 3.5–5.3)
Sodium: 141 mmol/L (ref 135–146)
Total Bilirubin: 0.7 mg/dL (ref 0.2–1.2)
Total Protein: 6.1 g/dL (ref 6.1–8.1)

## 2020-02-18 LAB — CBC WITH DIFFERENTIAL/PLATELET
Absolute Monocytes: 483 cells/uL (ref 200–950)
Basophils Absolute: 43 cells/uL (ref 0–200)
Basophils Relative: 0.6 %
Eosinophils Absolute: 213 cells/uL (ref 15–500)
Eosinophils Relative: 3 %
HCT: 43.6 % (ref 35.0–45.0)
Hemoglobin: 14.6 g/dL (ref 11.7–15.5)
Lymphs Abs: 1711 cells/uL (ref 850–3900)
MCH: 31.7 pg (ref 27.0–33.0)
MCHC: 33.5 g/dL (ref 32.0–36.0)
MCV: 94.8 fL (ref 80.0–100.0)
MPV: 9.8 fL (ref 7.5–12.5)
Monocytes Relative: 6.8 %
Neutro Abs: 4651 cells/uL (ref 1500–7800)
Neutrophils Relative %: 65.5 %
Platelets: 213 10*3/uL (ref 140–400)
RBC: 4.6 10*6/uL (ref 3.80–5.10)
RDW: 11.8 % (ref 11.0–15.0)
Total Lymphocyte: 24.1 %
WBC: 7.1 10*3/uL (ref 3.8–10.8)

## 2020-02-18 LAB — LIPID PANEL
Cholesterol: 201 mg/dL — ABNORMAL HIGH (ref <200)
HDL: 57 mg/dL (ref >=50)
LDL Cholesterol (Calc): 127 mg/dL (calc) — ABNORMAL HIGH
Non-HDL Cholesterol (Calc): 144 mg/dL (calc) — ABNORMAL HIGH (ref <130)
Total CHOL/HDL Ratio: 3.5 (calc) (ref <5.0)
Triglycerides: 78 mg/dL (ref <150)

## 2020-02-18 LAB — VITAMIN D 25 HYDROXY (VIT D DEFICIENCY, FRACTURES): Vit D, 25-Hydroxy: 44 ng/mL (ref 30–100)

## 2020-02-18 LAB — VITAMIN B12: Vitamin B-12: 437 pg/mL (ref 200–1100)

## 2020-02-18 LAB — HEMOGLOBIN A1C
Hgb A1c MFr Bld: 5 % of total Hgb (ref <5.7)
Mean Plasma Glucose: 97 (calc)
eAG (mmol/L): 5.4 (calc)

## 2020-02-19 MED ORDER — OMEPRAZOLE 20 MG PO CPDR: 20.0000 mg | DELAYED_RELEASE_CAPSULE | Freq: Every day | ORAL | 3 refills | Status: DC

## 2020-02-19 MED ORDER — FAMOTIDINE 20 MG PO TABS
20.0000 mg | ORAL_TABLET | Freq: Two times a day (BID) | ORAL | 3 refills | Status: DC | PRN
Start: 2020-02-19 — End: 2020-03-26

## 2020-02-19 MED ORDER — GABAPENTIN 100 MG PO CAPS: ORAL_CAPSULE | ORAL | 3 refills | Status: DC

## 2020-02-19 MED ORDER — ALBUTEROL SULFATE HFA 108 (90 BASE) MCG/ACT IN AERS: INHALATION_SPRAY | RESPIRATORY_TRACT | 3 refills | Status: DC

## 2020-02-19 MED ORDER — CROMOLYN SODIUM 4 % OP SOLN: 1.0000 [drp] | Freq: Four times a day (QID) | OPHTHALMIC | 0 refills | Status: DC

## 2020-02-21 ENCOUNTER — Encounter: Payer: Self-pay | Admitting: *Deleted

## 2020-02-21 ENCOUNTER — Other Ambulatory Visit: Payer: Self-pay | Admitting: Family Medicine

## 2020-02-25 ENCOUNTER — Other Ambulatory Visit: Payer: Self-pay

## 2020-02-25 MED ORDER — FLUTICASONE PROPIONATE 50 MCG/ACT NA SUSP: NASAL | 6 refills | Status: DC

## 2020-02-27 ENCOUNTER — Inpatient Hospital Stay: Payer: PPO | Attending: Hematology & Oncology

## 2020-02-27 ENCOUNTER — Other Ambulatory Visit: Payer: Self-pay

## 2020-02-27 ENCOUNTER — Inpatient Hospital Stay (HOSPITAL_BASED_OUTPATIENT_CLINIC_OR_DEPARTMENT_OTHER): Payer: PPO | Admitting: Hematology & Oncology

## 2020-02-27 ENCOUNTER — Encounter: Payer: Self-pay | Admitting: Hematology & Oncology

## 2020-02-27 VITALS — BP 124/72 | HR 60 | Temp 97.2°F | Resp 16 | Ht 64.17 in | Wt 192.0 lb

## 2020-02-27 DIAGNOSIS — Z853 Personal history of malignant neoplasm of breast: Secondary | ICD-10-CM | POA: Insufficient documentation

## 2020-02-27 DIAGNOSIS — Z9013 Acquired absence of bilateral breasts and nipples: Secondary | ICD-10-CM | POA: Diagnosis not present

## 2020-02-27 DIAGNOSIS — Z79899 Other long term (current) drug therapy: Secondary | ICD-10-CM | POA: Diagnosis not present

## 2020-02-27 DIAGNOSIS — C779 Secondary and unspecified malignant neoplasm of lymph node, unspecified: Secondary | ICD-10-CM | POA: Diagnosis not present

## 2020-02-27 DIAGNOSIS — C50911 Malignant neoplasm of unspecified site of right female breast: Secondary | ICD-10-CM

## 2020-02-27 DIAGNOSIS — Z9221 Personal history of antineoplastic chemotherapy: Secondary | ICD-10-CM | POA: Insufficient documentation

## 2020-02-27 LAB — CBC WITH DIFFERENTIAL (CANCER CENTER ONLY)
Abs Immature Granulocytes: 0.01 10*3/uL (ref 0.00–0.07)
Basophils Absolute: 0 10*3/uL (ref 0.0–0.1)
Basophils Relative: 1 %
Eosinophils Absolute: 0.2 10*3/uL (ref 0.0–0.5)
Eosinophils Relative: 3 %
HCT: 44.1 % (ref 36.0–46.0)
Hemoglobin: 14.4 g/dL (ref 12.0–15.0)
Immature Granulocytes: 0 %
Lymphocytes Relative: 28 %
Lymphs Abs: 1.7 10*3/uL (ref 0.7–4.0)
MCH: 32.1 pg (ref 26.0–34.0)
MCHC: 32.7 g/dL (ref 30.0–36.0)
MCV: 98.2 fL (ref 80.0–100.0)
Monocytes Absolute: 0.4 10*3/uL (ref 0.1–1.0)
Monocytes Relative: 6 %
Neutro Abs: 3.7 10*3/uL (ref 1.7–7.7)
Neutrophils Relative %: 62 %
Platelet Count: 207 10*3/uL (ref 150–400)
RBC: 4.49 MIL/uL (ref 3.87–5.11)
RDW: 12.6 % (ref 11.5–15.5)
WBC Count: 6 10*3/uL (ref 4.0–10.5)
nRBC: 0 % (ref 0.0–0.2)

## 2020-02-27 LAB — CMP (CANCER CENTER ONLY)
ALT: 9 U/L (ref 0–44)
AST: 18 U/L (ref 15–41)
Albumin: 3.8 g/dL (ref 3.5–5.0)
Alkaline Phosphatase: 60 U/L (ref 38–126)
Anion gap: 5 (ref 5–15)
BUN: 10 mg/dL (ref 8–23)
CO2: 32 mmol/L (ref 22–32)
Calcium: 9.5 mg/dL (ref 8.9–10.3)
Chloride: 103 mmol/L (ref 98–111)
Creatinine: 0.55 mg/dL (ref 0.44–1.00)
GFR, Est AFR Am: >60 mL/min (ref >60)
GFR, Estimated: >60 mL/min (ref >60)
Glucose, Bld: 95 mg/dL (ref 70–99)
Potassium: 4.4 mmol/L (ref 3.5–5.1)
Sodium: 140 mmol/L (ref 135–145)
Total Bilirubin: 0.7 mg/dL (ref 0.3–1.2)
Total Protein: 6.3 g/dL — ABNORMAL LOW (ref 6.5–8.1)

## 2020-02-27 NOTE — Progress Notes (Signed)
Hematology and Oncology Follow Up Visit  [name] [id number] April 04, 1941 79 y.o. 02/27/2020   Principle Diagnosis:  Stage IIB (T2 N1 M0) ductal carcinoma of the right breast.  Current Therapy:    Observation     Interim History:  Ms.  Figueroa is comes in for followup. We last saw her a year ago. She's doing well.   She is still having problems with her left shoulder.  I think she has had surgery for the right shoulder.  She really does not want to have any more surgery.  She has not gotten the coronavirus vaccine.  She just is not sure she wants to take it.  She is praying about this.  She has had no problems with fever.  She has had no problems with nausea or vomiting.  She has had no change in bowel or bladder habits.  She has had no issues with rashes.  There is been no leg swelling.    Overall, her performance status is ECOG 2.  Medications:  Current Outpatient Medications:  .  albuterol (PROAIR HFA) 108 (90 Base) MCG/ACT inhaler, TAKE 2 PUFFS BY MOUTH EVERY 6 HOURS AS NEEDED FOR WHEEZE OR SHORTNESS OF BREATH, Disp: 18 g, Rfl: 3 .  albuterol (PROVENTIL) (2.5 MG/3ML) 0.083% nebulizer solution, USE 1 VIAL VIA NEBULIZER EVERY 6 HOURS AS NEEDED FOR WHEEZING OR FOR SHORTNESS OF BREATH, Disp: 75 mL, Rfl: 1 .  cromolyn (OPTICROM) 4 % ophthalmic solution, Place 1 drop into both eyes 4 (four) times daily., Disp: 10 mL, Rfl: 0 .  escitalopram (LEXAPRO) 10 MG tablet, TAKE 1 TABLET BY MOUTH EVERY DAY, Disp: 90 tablet, Rfl: 2 .  famotidine (PEPCID) 20 MG tablet, Take 1 tablet (20 mg total) by mouth 2 (two) times daily as needed for heartburn or indigestion., Disp: 60 tablet, Rfl: 3 .  fluticasone (FLONASE) 50 MCG/ACT nasal spray, SPRAY 2 SPRAYS INTO EACH NOSTRIL EVERY DAY, Disp: 16 mL, Rfl: 6 .  gabapentin (NEURONTIN) 100 MG capsule, TAKE 1 CAPSULE BY MOUTH IN THE MORNING AND 2 CAPSULES AT BEDTIME., Disp: 90 capsule, Rfl: 3 .  HYDROcodone-acetaminophen (NORCO/VICODIN) 5-325 MG tablet,  Take 1 tablet by mouth 2 (two) times daily as needed., Disp: 60 tablet, Rfl: 0 .  levocetirizine (XYZAL) 5 MG tablet, TAKE 1 TABLET BY MOUTH EVERY DAY IN THE EVENING, Disp: 90 tablet, Rfl: 0 .  Multiple Vitamin (MULTIVITAMIN WITH MINERALS) TABS tablet, Take 1 tablet by mouth at bedtime. CENTRUM FOR WOMEN 50+, Disp: , Rfl:  .  ofloxacin (OCUFLOX) 0.3 % ophthalmic solution, Place 1 drop into both eyes 4 (four) times daily., Disp: 5 mL, Rfl: 0 .  omeprazole (PRILOSEC) 20 MG capsule, Take 1 capsule (20 mg total) by mouth daily., Disp: 90 capsule, Rfl: 3 .  vitamin B-12 (CYANOCOBALAMIN) 1000 MCG tablet, Take 1,000 mcg by mouth at bedtime., Disp: , Rfl:   Allergies:  Allergies  Allergen Reactions  . Penicillins Hives, Rash and Other (See Comments)    PATIENT HAS HAD A PCN REACTION WITH IMMEDIATE RASH, FACIAL/TONGUE/THROAT SWELLING, SOB, OR LIGHTHEADEDNESS WITH HYPOTENSION:  #  #  YES  #  #  Has patient had a PCN reaction causing severe rash involving mucus membranes or skin necrosis: No Has patient had a PCN reaction that required hospitalization: No Has patient had a PCN reaction occurring within the last 10 years: No If all of the above answers are "NO", then may proceed with Cephalosporin use.     Past Medical  History, Surgical history, Social history, and Family History were reviewed and updated.  Review of Systems: Review of Systems  Constitutional: Negative.   HENT: Negative.   Eyes: Negative.   Respiratory: Negative.   Cardiovascular: Negative.   Gastrointestinal: Negative.   Genitourinary: Negative.   Musculoskeletal: Positive for joint pain.  Skin: Negative.   Neurological: Negative.   Endo/Heme/Allergies: Negative.   Psychiatric/Behavioral: Negative.      Physical Exam:  height is 5' 4.17" (1.63 m) and weight is 192 lb (87.1 kg). Her oral temperature is 97.2 F (36.2 C) (abnormal). Her blood pressure is 124/72 and her pulse is 60. Her respiration is 16 and oxygen  saturation is 100%.   Physical Exam Vitals reviewed.  Constitutional:      Comments: She has bilateral mastectomies.  She has osteo-parotic changes in her back.  She has some kyphosis.  HENT:     Head: Normocephalic and atraumatic.  Eyes:     Pupils: Pupils are equal, round, and reactive to light.  Cardiovascular:     Rate and Rhythm: Normal rate and regular rhythm.     Heart sounds: Normal heart sounds.  Pulmonary:     Effort: Pulmonary effort is normal.     Breath sounds: Normal breath sounds.  Abdominal:     General: Bowel sounds are normal.     Palpations: Abdomen is soft.  Musculoskeletal:        General: No tenderness or deformity. Normal range of motion.     Cervical back: Normal range of motion.     Comments: She has severe limitation of range of motion of the left arm.  She has mild lymphedema of the left arm.  Lymphadenopathy:     Cervical: No cervical adenopathy.  Skin:    General: Skin is warm and dry.     Findings: No erythema or rash.  Neurological:     Mental Status: She is alert and oriented to person, place, and time.  Psychiatric:        Behavior: Behavior normal.        Thought Content: Thought content normal.        Judgment: Judgment normal.    .  Lab Results  Component Value Date   WBC 6.0 02/27/2020   HGB 14.4 02/27/2020   HCT 44.1 02/27/2020   MCV 98.2 02/27/2020   PLT 207 02/27/2020     Chemistry      Component Value Date/Time   NA 140 02/27/2020 1459   NA 139 12/23/2016 1018   K 4.4 02/27/2020 1459   K 4.2 12/23/2016 1018   CL 103 02/27/2020 1459   CO2 32 02/27/2020 1459   CO2 29 12/23/2016 1018   BUN 10 02/27/2020 1459   BUN 8.2 12/23/2016 1018   CREATININE 0.55 02/27/2020 1459   CREATININE 0.57 (L) 02/17/2020 1554   CREATININE 0.7 12/23/2016 1018      Component Value Date/Time   CALCIUM 9.5 02/27/2020 1459   CALCIUM 9.5 12/23/2016 1018   ALKPHOS 60 02/27/2020 1459   ALKPHOS 69 12/23/2016 1018   AST 18 02/27/2020 1459    AST 24 12/23/2016 1018   ALT 9 02/27/2020 1459   ALT 15 12/23/2016 1018   BILITOT 0.7 02/27/2020 1459   BILITOT 0.94 12/23/2016 1018         Impression and Plan: [name] is 79 year old white female with a history of stage IIB infiltrating ductal carcinoma the right breast. She did receive some adjuvant chemotherapy. She had  chemotherapy that was completed back in June of 2004.  I do not see any evidence of recurrent disease. She now is 18 years out from therapy.  She likes to come back to see [place]. She just feels much more confident in our examining her.  We will go ahead and her back in another year.   Volanda Napoleon, MD 7/29/20214:42 PM

## 2020-02-28 ENCOUNTER — Telehealth: Payer: Self-pay | Admitting: Hematology & Oncology

## 2020-02-28 NOTE — Telephone Encounter (Signed)
lmom to inform patient of appt 02/26/21 at 1 pm per 7/29 los

## 2020-03-16 ENCOUNTER — Telehealth: Payer: Self-pay | Admitting: Family Medicine

## 2020-03-16 NOTE — Telephone Encounter (Signed)
Patient left vm requesting a refill on her hydro.  CB # [phone]

## 2020-03-17 ENCOUNTER — Other Ambulatory Visit: Payer: Self-pay

## 2020-03-17 MED ORDER — HYDROCODONE-ACETAMINOPHEN 5-325 MG PO TABS: 1.0000 | ORAL_TABLET | Freq: Two times a day (BID) | ORAL | 0 refills | Status: DC | PRN

## 2020-03-17 NOTE — Telephone Encounter (Signed)
Requested Prescriptions   Pending Prescriptions Disp Refills  . HYDROcodone-acetaminophen (NORCO/VICODIN) 5-325 MG tablet 60 tablet 0    Sig: Take 1 tablet by mouth 2 (two) times daily as needed.    Last OV 02/17/2020   Last written 01/21/2020

## 2020-03-25 ENCOUNTER — Encounter: Payer: Self-pay | Admitting: Family Medicine

## 2020-03-25 DIAGNOSIS — H04123 Dry eye syndrome of bilateral lacrimal glands: Secondary | ICD-10-CM | POA: Diagnosis not present

## 2020-03-25 DIAGNOSIS — Z961 Presence of intraocular lens: Secondary | ICD-10-CM | POA: Diagnosis not present

## 2020-03-25 DIAGNOSIS — H40013 Open angle with borderline findings, low risk, bilateral: Secondary | ICD-10-CM | POA: Diagnosis not present

## 2020-03-25 DIAGNOSIS — H26493 Other secondary cataract, bilateral: Secondary | ICD-10-CM | POA: Diagnosis not present

## 2020-03-26 ENCOUNTER — Other Ambulatory Visit: Payer: Self-pay | Admitting: Nurse Practitioner

## 2020-03-26 DIAGNOSIS — H5789 Other specified disorders of eye and adnexa: Secondary | ICD-10-CM

## 2020-03-26 DIAGNOSIS — J0141 Acute recurrent pansinusitis: Secondary | ICD-10-CM

## 2020-04-28 ENCOUNTER — Other Ambulatory Visit: Payer: Self-pay | Admitting: Family Medicine

## 2020-04-28 NOTE — Telephone Encounter (Signed)
[phone] Refill Hydrocodone ----[id number] [place], [place] - [address] AT [place]

## 2020-04-29 NOTE — Telephone Encounter (Signed)
Ok to refill??  Last office visit 02/17/2020.  Last refill 03/17/2020.

## 2020-04-30 ENCOUNTER — Other Ambulatory Visit: Payer: Self-pay | Admitting: Family Medicine

## 2020-04-30 DIAGNOSIS — J209 Acute bronchitis, unspecified: Secondary | ICD-10-CM

## 2020-04-30 DIAGNOSIS — J441 Chronic obstructive pulmonary disease with (acute) exacerbation: Secondary | ICD-10-CM

## 2020-04-30 MED ORDER — HYDROCODONE-ACETAMINOPHEN 5-325 MG PO TABS: 1.0000 | ORAL_TABLET | Freq: Two times a day (BID) | ORAL | 0 refills | Status: DC | PRN

## 2020-05-27 ENCOUNTER — Telehealth: Payer: Self-pay | Admitting: Family Medicine

## 2020-05-27 NOTE — Telephone Encounter (Signed)
Refill Hydrocodone     [id number] [place], [place] - [address]

## 2020-05-28 ENCOUNTER — Other Ambulatory Visit: Payer: Self-pay | Admitting: Family Medicine

## 2020-05-28 MED ORDER — HYDROCODONE-ACETAMINOPHEN 5-325 MG PO TABS: 1.0000 | ORAL_TABLET | Freq: Two times a day (BID) | ORAL | 0 refills | Status: DC | PRN

## 2020-07-10 ENCOUNTER — Other Ambulatory Visit: Payer: Self-pay | Admitting: Family Medicine

## 2020-07-10 MED ORDER — HYDROCODONE-ACETAMINOPHEN 5-325 MG PO TABS: 1.0000 | ORAL_TABLET | Freq: Two times a day (BID) | ORAL | 0 refills | Status: DC | PRN

## 2020-07-10 NOTE — Telephone Encounter (Signed)
Ok to refill??  Last office visit 02/17/2020.  Last refill 05/28/2020.

## 2020-07-10 NOTE — Telephone Encounter (Signed)
Pt need refill Hydrocodone

## 2020-07-11 ENCOUNTER — Other Ambulatory Visit: Payer: Self-pay | Admitting: Family Medicine

## 2020-07-11 DIAGNOSIS — J0141 Acute recurrent pansinusitis: Secondary | ICD-10-CM

## 2020-07-13 ENCOUNTER — Other Ambulatory Visit: Payer: Self-pay | Admitting: Hematology & Oncology

## 2020-08-19 ENCOUNTER — Other Ambulatory Visit: Payer: Self-pay | Admitting: Family Medicine

## 2020-08-19 NOTE — Telephone Encounter (Signed)
Pt called requesting med refill for Hydrocodone

## 2020-08-20 MED ORDER — HYDROCODONE-ACETAMINOPHEN 5-325 MG PO TABS: 1.0000 | ORAL_TABLET | Freq: Two times a day (BID) | ORAL | 0 refills | Status: DC | PRN

## 2020-09-09 ENCOUNTER — Telehealth: Payer: Self-pay

## 2020-09-09 NOTE — Telephone Encounter (Signed)
Called and left a vm with a new appt for 7/28 as dr Marin Olp is on call 7/29 , will also mail a new sch    [name]

## 2020-10-22 ENCOUNTER — Other Ambulatory Visit: Payer: Self-pay

## 2020-10-22 NOTE — Telephone Encounter (Signed)
Ok to refill??  Last office visit 02/17/2020.  Last refill 08/20/2020.

## 2020-10-22 NOTE — Telephone Encounter (Signed)
Patient called needs med refill  HYDROcodone-acetaminophen (NORCO/VICODIN) 5-325 MG tablet  Pharmacy [id number] [place], [place] - [address]  [address] [place] [address]  Phone:  [phone] Fax:  [phone]

## 2020-10-23 MED ORDER — HYDROCODONE-ACETAMINOPHEN 5-325 MG PO TABS: 1.0000 | ORAL_TABLET | Freq: Two times a day (BID) | ORAL | 0 refills | Status: DC | PRN

## 2020-11-26 ENCOUNTER — Telehealth: Payer: Self-pay | Admitting: Family Medicine

## 2020-11-26 NOTE — Telephone Encounter (Signed)
Copied from [place] [phone]. Topic: Medicare AWV >> Nov 26, 2020  3:36 PM Cher Nakai R wrote: Reason for CRM:  Left message for patient to call back and schedule Medicare Annual Wellness Visit (AWV) in office.   If not able to come in office, please offer to do virtually or by telephone.   Last AWV: 10/05/2017  Please schedule at anytime with [facility]-Nurse Health Advisor.  If any questions, please contact me at [phone]

## 2020-12-01 ENCOUNTER — Other Ambulatory Visit: Payer: Self-pay | Admitting: Family Medicine

## 2020-12-01 MED ORDER — HYDROCODONE-ACETAMINOPHEN 5-325 MG PO TABS: 1.0000 | ORAL_TABLET | Freq: Two times a day (BID) | ORAL | 0 refills | Status: DC | PRN

## 2020-12-01 NOTE — Telephone Encounter (Signed)
Patient called to request refill of HYDROcodone-acetaminophen (NORCO/VICODIN) 5-325 MG tablet [[id number]]  Pharmacy:   [id number] - [place], [place] - [address]  [address] [place] [address]  Phone:  [phone] Fax:  [phone]  DEA #:  [id number]  Patient going out of town this afternoon and is requesting a refill this morning.   Please advise patient at [phone].

## 2020-12-16 ENCOUNTER — Encounter: Payer: Self-pay | Admitting: Genetic Counselor

## 2021-01-19 ENCOUNTER — Other Ambulatory Visit: Payer: Self-pay

## 2021-01-19 DIAGNOSIS — J0141 Acute recurrent pansinusitis: Secondary | ICD-10-CM

## 2021-01-19 MED ORDER — MONTELUKAST SODIUM 10 MG PO TABS: ORAL_TABLET | ORAL | 1 refills | Status: DC

## 2021-01-25 ENCOUNTER — Other Ambulatory Visit: Payer: Self-pay | Admitting: Family Medicine

## 2021-01-25 MED ORDER — ALBUTEROL SULFATE (2.5 MG/3ML) 0.083% IN NEBU: INHALATION_SOLUTION | RESPIRATORY_TRACT | 1 refills | Status: DC

## 2021-01-25 NOTE — Telephone Encounter (Signed)
Ok to refill??  Last office visit 02/17/2020.  Last refill 12/01/2020.

## 2021-01-25 NOTE — Telephone Encounter (Signed)
Pt called in requesting a refill of HYDROcodone-acetaminophen (NORCO/VICODIN) 5-325  albuterol (PROVENTIL) (2.5 MG/3ML) 0.083% nebulizer solution sent to pharmacy.  Cb#: [phone]

## 2021-01-26 MED ORDER — HYDROCODONE-ACETAMINOPHEN 5-325 MG PO TABS: 1.0000 | ORAL_TABLET | Freq: Two times a day (BID) | ORAL | 0 refills | Status: DC | PRN

## 2021-01-26 NOTE — Telephone Encounter (Signed)
PDMP reviewed.  Appears patient takes this medication chronically.  Refill given today in place of PCP who is out of the office.

## 2021-02-25 ENCOUNTER — Inpatient Hospital Stay: Payer: PPO | Attending: Hematology & Oncology

## 2021-02-25 ENCOUNTER — Inpatient Hospital Stay: Payer: PPO | Admitting: Hematology & Oncology

## 2021-02-26 ENCOUNTER — Other Ambulatory Visit: Payer: PPO

## 2021-02-26 ENCOUNTER — Ambulatory Visit: Payer: PPO | Admitting: Hematology & Oncology

## 2021-03-19 ENCOUNTER — Other Ambulatory Visit: Payer: Self-pay

## 2021-03-19 MED ORDER — HYDROCODONE-ACETAMINOPHEN 5-325 MG PO TABS: 1.0000 | ORAL_TABLET | Freq: Two times a day (BID) | ORAL | 0 refills | Status: DC | PRN

## 2021-03-19 NOTE — Telephone Encounter (Signed)
Pt called in requesting a refill of HYDROcodone-acetaminophen (NORCO/VICODIN) 5-325 MG tablet sent to pharmacy.    Cb#: [phone]

## 2021-03-19 NOTE — Telephone Encounter (Signed)
Ok to refill??  Last office visit 02/17/2020.  Last refill 01/26/2021.

## 2021-03-25 ENCOUNTER — Other Ambulatory Visit: Payer: Self-pay | Admitting: Podiatry

## 2021-04-13 ENCOUNTER — Ambulatory Visit (INDEPENDENT_AMBULATORY_CARE_PROVIDER_SITE_OTHER): Payer: PPO | Admitting: Family Medicine

## 2021-04-13 ENCOUNTER — Other Ambulatory Visit: Payer: Self-pay

## 2021-04-13 ENCOUNTER — Encounter: Payer: Self-pay | Admitting: Family Medicine

## 2021-04-13 VITALS — BP 124/76 | HR 61 | Ht 64.0 in | Wt 181.0 lb

## 2021-04-13 DIAGNOSIS — M8949 Other hypertrophic osteoarthropathy, multiple sites: Secondary | ICD-10-CM | POA: Diagnosis not present

## 2021-04-13 DIAGNOSIS — G629 Polyneuropathy, unspecified: Secondary | ICD-10-CM | POA: Diagnosis not present

## 2021-04-13 DIAGNOSIS — Z96612 Presence of left artificial shoulder joint: Secondary | ICD-10-CM

## 2021-04-13 DIAGNOSIS — I890 Lymphedema, not elsewhere classified: Secondary | ICD-10-CM

## 2021-04-13 DIAGNOSIS — M159 Polyosteoarthritis, unspecified: Secondary | ICD-10-CM

## 2021-04-13 NOTE — Progress Notes (Signed)
Subjective:    Patient ID: [name], female    DOB: 07-23-1941, 80 y.o.   MRN: [id number]  HPI  Patient is a very sweet 80 year old Caucasian female who comes in today for a checkup.  She has a history of arthritis in her hands and in her shoulders and in her feet and knees.  She takes 1 hydrocodone every day at night to alleviate the pain so that she can go to sleep.  Specifically she states that the PIP and DIP joints which are dysmorphic and features today on exam ache and throb at night and keep her awake.  If she takes the hydrocodone she is able to go to sleep.  She also has chronic pain in her right shoulder that required shoulder replacement.  She also deals with pain in her feet and in her knees.  She shows no evidence of abuse or diversion.  She denies any falls or memory loss or dizziness on the medication.  She does take a stool softener to prevent constipation.  She is also on gabapentin for neuropathy in her feet.  She states that she primarily has numbness in her feet.  She denies any burning or stinging pain.  She is not sure if the gabapentin really helps.  I recommended that she temporarily stop the medication to see if it is giving her any benefit.  I want to try to limit any centrally acting medication that could affect her balance or her memory.  If she notices burning pain in her feet she can certainly use the gabapentin as needed however she denies any pain today in her feet related to neuropathy.  She also has a history of asthma and right now she is using the albuterol sparingly.  She states that she uses it mainly during weather changes or with allergies but she is not using it on a daily basis.  Her review of systems is otherwise negative.  She denies any chest pain or shortness of breath or dyspnea on exertion.  She denies any melena or hematochezia.  She denies any stomach pain.  Overall she is doing well.  She refuses a flu shot due to a previous allergic reaction and  she is also afraid to get the COVID-vaccine for the same reason. Past Medical History:  Diagnosis Date   Anemia    Arthritis    "hands, feet; shoulders; all over" (02/23/2018)   Asthma    "as a child; acts up a little sometimes" (02/23/2018)   Breast cancer, left breast ([place]) 1967   Cancer of right breast ([place]) 2003   Depression    Diverticulosis    GERD (gastroesophageal reflux disease)    History of blood transfusion 2003   "several; before my right breast OR"   History of hiatal hernia    Hypertension    after weight loss, pt came off medications and has not had it since   Lymphedema of left arm    Osteoporosis    Pneumonia    "once" (02/23/2018)   Vitamin D deficiency    Past Surgical History:  Procedure Laterality Date   ABDOMINAL HYSTERECTOMY     APPENDECTOMY     BASAL CELL CARCINOMA EXCISION Right    "top of my forehead"   BREAST BIOPSY Left 1967   BREAST BIOPSY Right 2003   CATARACT EXTRACTION W/ INTRAOCULAR LENS  IMPLANT, BILATERAL Bilateral    CHOLECYSTECTOMY OPEN     COLONOSCOPY     JOINT REPLACEMENT  MASTECTOMY Left 1967   MASTECTOMY Right 2003   REVERSE SHOULDER ARTHROPLASTY Left 02/23/2018   REVERSE SHOULDER ARTHROPLASTY Left 02/23/2018   Procedure: REVERSE LEFT SHOULDER ARTHROPLASTY;  Surgeon: Netta Cedars, MD;  Location: [facility];  Service: Orthopedics;  Laterality: Left;   TONSILLECTOMY     TOTAL KNEE ARTHROPLASTY Bilateral    Current Outpatient Medications on File Prior to Visit  Medication Sig Dispense Refill   albuterol (PROVENTIL) (2.5 MG/3ML) 0.083% nebulizer solution USE 1 VIAL VIA NEBULIZER EVERY 6 HOURS AS NEEDED FOR WHEEZING OR FOR SHORTNESS OF BREATH 75 mL 1   albuterol (VENTOLIN HFA) 108 (90 Base) MCG/ACT inhaler INHALE 2 PUFFS INTO THE LUNGS EVERY 6 HOURS AS NEEDED FOR WHEEZE/SHORTNESS OF BREATH 17 each 3   cromolyn (OPTICROM) 4 % ophthalmic solution INSTILL 1 DROP INTO BOTH EYES 4 TIMES A DAY 10 mL 0   escitalopram (LEXAPRO) 10 MG tablet  TAKE 1 TABLET BY MOUTH EVERY DAY 90 tablet 2   famotidine (PEPCID) 20 MG tablet TAKE 1 TABLET (20 MG TOTAL) BY MOUTH 2 (TWO) TIMES DAILY AS NEEDED FOR HEARTBURN OR INDIGESTION. 180 tablet 1   fluticasone (FLONASE) 50 MCG/ACT nasal spray SPRAY 2 SPRAYS INTO EACH NOSTRIL EVERY DAY 16 mL 6   gabapentin (NEURONTIN) 100 MG capsule TAKE 1 CAPSULE BY MOUTH IN THE MORNING AND 2 CAPSULES AT BEDTIME. 90 capsule 3   HYDROcodone-acetaminophen (NORCO/VICODIN) 5-325 MG tablet Take 1 tablet by mouth 2 (two) times daily as needed. 60 tablet 0   levocetirizine (XYZAL) 5 MG tablet TAKE 1 TABLET BY MOUTH EVERY DAY IN THE EVENING 90 tablet 0   montelukast (SINGULAIR) 10 MG tablet TAKE 1 TABLET BY MOUTH EVERYDAY AT BEDTIME 90 tablet 1   Multiple Vitamin (MULTIVITAMIN WITH MINERALS) TABS tablet Take 1 tablet by mouth at bedtime. CENTRUM FOR WOMEN 50+     ofloxacin (OCUFLOX) 0.3 % ophthalmic solution Place 1 drop into both eyes 4 (four) times daily. 5 mL 0   omeprazole (PRILOSEC) 20 MG capsule Take 1 capsule (20 mg total) by mouth daily. 90 capsule 3   vitamin B-12 (CYANOCOBALAMIN) 1000 MCG tablet Take 1,000 mcg by mouth at bedtime.     No current facility-administered medications on file prior to visit.   Allergies  Allergen Reactions   Penicillins Hives, Rash and Other (See Comments)    PATIENT HAS HAD A PCN REACTION WITH IMMEDIATE RASH, FACIAL/TONGUE/THROAT SWELLING, SOB, OR LIGHTHEADEDNESS WITH HYPOTENSION:  #  #  YES  #  #  Has patient had a PCN reaction causing severe rash involving mucus membranes or skin necrosis: No Has patient had a PCN reaction that required hospitalization: No Has patient had a PCN reaction occurring within the last 10 years: No If all of the above answers are "NO", then may proceed with Cephalosporin use.    Social History   Socioeconomic History   Marital status: Divorced    Spouse name: Not on file   Number of children: Not on file   Years of education: Not on file   Highest  education level: Not on file  Occupational History   Not on file  Tobacco Use   Smoking status: Former    Packs/day: 0.75    Years: 4.00    Pack years: 3.00    Types: Cigarettes    Quit date: 58    Years since quitting: 64.7   Smokeless tobacco: Never  Vaping Use   Vaping Use: Never used  Substance and Sexual Activity  Alcohol use: Not Currently    Alcohol/week: 0.0 standard drinks    Comment: 02/23/2018 "glass of wine 1-2 times/yr; if that"   Drug use: Never   Sexual activity: Not Currently  Other Topics Concern   Not on file  Social History Narrative   Not on file   Social Determinants of Health   Financial Resource Strain: Not on file  Food Insecurity: Not on file  Transportation Needs: Not on file  Physical Activity: Not on file  Stress: Not on file  Social Connections: Not on file  Intimate Partner Violence: Not on file      Review of Systems  All other systems reviewed and are negative.     Objective:   Physical Exam Constitutional:      Appearance: She is well-developed.  HENT:     Nose: Mucosal edema and rhinorrhea present.     Right Sinus: Maxillary sinus tenderness and frontal sinus tenderness present.  Cardiovascular:     Rate and Rhythm: Normal rate and regular rhythm.     Heart sounds: Normal heart sounds. No murmur heard. Pulmonary:     Effort: Pulmonary effort is normal. No respiratory distress.     Breath sounds: Normal breath sounds. No wheezing or rales.  Abdominal:     General: Bowel sounds are normal. There is no distension.     Palpations: Abdomen is soft.     Tenderness: There is no abdominal tenderness. There is no rebound.  Musculoskeletal:     Left shoulder: Tenderness present. Decreased range of motion. Decreased strength.  Skin:    Findings: Rash present. Rash is papular.              Assessment & Plan:  Neuropathy - Plan: CBC with Differential/Platelet, COMPLETE METABOLIC PANEL WITH GFR  Lymphedema of left arm -  Plan: CBC with Differential/Platelet, COMPLETE METABOLIC PANEL WITH GFR  Primary osteoarthritis involving multiple joints  H/O total shoulder replacement, left Overall the patient is doing well.  I have no concern about abuse or diversion.  I will gladly refill her hydrocodone that she uses 1 tablet at night to help her sleep.  I recommended that she temporarily discontinue gabapentin to see if the medicine is truly helping neuropathy.  I want to avoid unnecessary sedating medications given her age.  I recommended that she continue a stool softener to avoid constipation on the narcotic.  I recommended the COVID-vaccine but she declined.  She does not also want to try the flu shot either.  I will check a CBC and a CMP today.  Due to age, she does not require further cancer screening

## 2021-04-14 LAB — COMPLETE METABOLIC PANEL WITH GFR
AG Ratio: 1.6 (calc) (ref 1.0–2.5)
ALT: 9 U/L (ref 6–29)
AST: 17 U/L (ref 10–35)
Albumin: 3.8 g/dL (ref 3.6–5.1)
Alkaline phosphatase (APISO): 65 U/L (ref 37–153)
BUN/Creatinine Ratio: 20 (calc) (ref 6–22)
BUN: 11 mg/dL (ref 7–25)
CO2: 31 mmol/L (ref 20–32)
Calcium: 9.6 mg/dL (ref 8.6–10.4)
Chloride: 104 mmol/L (ref 98–110)
Creat: 0.54 mg/dL — ABNORMAL LOW (ref 0.60–0.95)
Globulin: 2.4 g/dL (calc) (ref 1.9–3.7)
Glucose, Bld: 93 mg/dL (ref 65–99)
Potassium: 4.6 mmol/L (ref 3.5–5.3)
Sodium: 141 mmol/L (ref 135–146)
Total Bilirubin: 0.9 mg/dL (ref 0.2–1.2)
Total Protein: 6.2 g/dL (ref 6.1–8.1)
eGFR: 92 mL/min/{1.73_m2} (ref >=60)

## 2021-04-14 LAB — CBC WITH DIFFERENTIAL/PLATELET
Absolute Monocytes: 380 cells/uL (ref 200–950)
Basophils Absolute: 39 cells/uL (ref 0–200)
Basophils Relative: 0.7 %
Eosinophils Absolute: 149 cells/uL (ref 15–500)
Eosinophils Relative: 2.7 %
HCT: 42.4 % (ref 35.0–45.0)
Hemoglobin: 14.2 g/dL (ref 11.7–15.5)
Lymphs Abs: 1199 cells/uL (ref 850–3900)
MCH: 32 pg (ref 27.0–33.0)
MCHC: 33.5 g/dL (ref 32.0–36.0)
MCV: 95.5 fL (ref 80.0–100.0)
MPV: 9.7 fL (ref 7.5–12.5)
Monocytes Relative: 6.9 %
Neutro Abs: 3735 cells/uL (ref 1500–7800)
Neutrophils Relative %: 67.9 %
Platelets: 218 10*3/uL (ref 140–400)
RBC: 4.44 10*6/uL (ref 3.80–5.10)
RDW: 11.8 % (ref 11.0–15.0)
Total Lymphocyte: 21.8 %
WBC: 5.5 10*3/uL (ref 3.8–10.8)

## 2021-04-23 ENCOUNTER — Other Ambulatory Visit: Payer: Self-pay | Admitting: Hematology & Oncology

## 2021-04-24 ENCOUNTER — Other Ambulatory Visit: Payer: Self-pay | Admitting: Family Medicine

## 2021-05-04 ENCOUNTER — Other Ambulatory Visit: Payer: Self-pay | Admitting: Family Medicine

## 2021-05-04 DIAGNOSIS — J441 Chronic obstructive pulmonary disease with (acute) exacerbation: Secondary | ICD-10-CM

## 2021-05-04 DIAGNOSIS — J209 Acute bronchitis, unspecified: Secondary | ICD-10-CM

## 2021-05-12 DIAGNOSIS — H26493 Other secondary cataract, bilateral: Secondary | ICD-10-CM | POA: Diagnosis not present

## 2021-05-12 DIAGNOSIS — H40013 Open angle with borderline findings, low risk, bilateral: Secondary | ICD-10-CM | POA: Diagnosis not present

## 2021-05-12 DIAGNOSIS — H04123 Dry eye syndrome of bilateral lacrimal glands: Secondary | ICD-10-CM | POA: Diagnosis not present

## 2021-05-12 DIAGNOSIS — H524 Presbyopia: Secondary | ICD-10-CM | POA: Diagnosis not present

## 2021-05-12 DIAGNOSIS — H35363 Drusen (degenerative) of macula, bilateral: Secondary | ICD-10-CM | POA: Diagnosis not present

## 2021-05-12 LAB — HM DIABETES EYE EXAM

## 2021-05-13 ENCOUNTER — Encounter: Payer: Self-pay | Admitting: Family Medicine

## 2021-05-24 ENCOUNTER — Other Ambulatory Visit: Payer: Self-pay | Admitting: Family Medicine

## 2021-05-24 MED ORDER — GABAPENTIN 100 MG PO CAPS: ORAL_CAPSULE | ORAL | 3 refills | Status: DC

## 2021-05-24 MED ORDER — HYDROCODONE-ACETAMINOPHEN 5-325 MG PO TABS: 1.0000 | ORAL_TABLET | Freq: Two times a day (BID) | ORAL | 0 refills | Status: DC | PRN

## 2021-05-24 NOTE — Telephone Encounter (Signed)
Ok to refill??  Last office visit 04/13/2021.  Last refill on Gabapentin 02/19/2020.  Last refill on Hydrocodone/APAP 03/19/2021.

## 2021-05-24 NOTE — Telephone Encounter (Signed)
Patient left voicemail messages to request refills of   gabapentin (NEURONTIN) 100 MG capsule [[id number]]   HYDROcodone-acetaminophen (NORCO/VICODIN) 5-325 MG tablet [[id number]]   Pharmacy confirmed as   [id number] - [place], [place]  [address] [place] [address]  Phone:  [phone]  Fax:  [phone]  DEA #:  [id number]  Please advise at [phone].

## 2021-06-02 ENCOUNTER — Other Ambulatory Visit: Payer: Self-pay | Admitting: Family Medicine

## 2021-06-02 DIAGNOSIS — J0141 Acute recurrent pansinusitis: Secondary | ICD-10-CM

## 2021-06-10 ENCOUNTER — Ambulatory Visit (INDEPENDENT_AMBULATORY_CARE_PROVIDER_SITE_OTHER): Payer: PPO

## 2021-06-10 ENCOUNTER — Other Ambulatory Visit: Payer: Self-pay

## 2021-06-10 VITALS — Ht 66.5 in | Wt 181.0 lb

## 2021-06-10 DIAGNOSIS — Z0000 Encounter for general adult medical examination without abnormal findings: Secondary | ICD-10-CM | POA: Diagnosis not present

## 2021-06-10 NOTE — Patient Instructions (Addendum)
[name] , Thank you for taking time to come for your Medicare Wellness Visit. I appreciate your ongoing commitment to your health goals. Please review the following plan we discussed and let me know if I can assist you in the future.   Screening recommendations/referrals: Colonoscopy: No longer required due to age.  Mammogram: No longer required due to age.  Bone Density: Done 08/02/2007 Repeat every 2 years  Recommended yearly ophthalmology/optometry visit for glaucoma screening and checkup Recommended yearly dental visit for hygiene and checkup  Vaccinations: Influenza vaccine: Declined due to previous reaction. Pneumococcal vaccine: Done 07/01/2014 and 10/05/2017 Tdap vaccine: Due Repeat in 10 years  Shingles vaccine: Shingrix discussed. Please contact your pharmacy for coverage information.     Covid-19:Declined.   Advanced directives: Advance directive discussed with you today. Even though you declined this today, please call our office should you change your mind, and we can give you the proper paperwork for you to fill out.   Conditions/risks identified: Aim for 30 minutes of exercise or walking each day, drink 6-8 glasses of water and eat lots of fruits and vegetables.   Next appointment: Follow up in one year for your annual wellness visit 2023.   Preventive Care 82 Years and Older, Female Preventive care refers to lifestyle choices and visits with your health care provider that can promote health and wellness. What does preventive care include? A yearly physical exam. This is also called an annual well check. Dental exams once or twice a year. Routine eye exams. Ask your health care provider how often you should have your eyes checked. Personal lifestyle choices, including: Daily care of your teeth and gums. Regular physical activity. Eating a healthy diet. Avoiding tobacco and drug use. Limiting alcohol use. Practicing safe sex. Taking low-dose aspirin every  day. Taking vitamin and mineral supplements as recommended by your health care provider. What happens during an annual well check? The services and screenings done by your health care provider during your annual well check will depend on your age, overall health, lifestyle risk factors, and family history of disease. Counseling  Your health care provider may ask you questions about your: Alcohol use. Tobacco use. Drug use. Emotional well-being. Home and relationship well-being. Sexual activity. Eating habits. History of falls. Memory and ability to understand (cognition). Work and work Statistician. Reproductive health. Screening  You may have the following tests or measurements: Height, weight, and BMI. Blood pressure. Lipid and cholesterol levels. These may be checked every 5 years, or more frequently if you are over 36 years old. Skin check. Lung cancer screening. You may have this screening every year starting at age 82 if you have a 30-pack-year history of smoking and currently smoke or have quit within the past 15 years. Fecal occult blood test (FOBT) of the stool. You may have this test every year starting at age 53. Flexible sigmoidoscopy or colonoscopy. You may have a sigmoidoscopy every 5 years or a colonoscopy every 10 years starting at age 24. Hepatitis C blood test. Hepatitis B blood test. Sexually transmitted disease (STD) testing. Diabetes screening. This is done by checking your blood sugar (glucose) after you have not eaten for a while (fasting). You may have this done every 1-3 years. Bone density scan. This is done to screen for osteoporosis. You may have this done starting at age 58. Mammogram. This may be done every 1-2 years. Talk to your health care provider about how often you should have regular mammograms. Talk with your  health care provider about your test results, treatment options, and if necessary, the need for more tests. Vaccines  Your health care  provider may recommend certain vaccines, such as: Influenza vaccine. This is recommended every year. Tetanus, diphtheria, and acellular pertussis (Tdap, Td) vaccine. You may need a Td booster every 10 years. Zoster vaccine. You may need this after age 47. Pneumococcal 13-valent conjugate (PCV13) vaccine. One dose is recommended after age 77. Pneumococcal polysaccharide (PPSV23) vaccine. One dose is recommended after age 71. Talk to your health care provider about which screenings and vaccines you need and how often you need them. This information is not intended to replace advice given to you by your health care provider. Make sure you discuss any questions you have with your health care provider. Document Released: 08/14/2015 Document Revised: 04/06/2016 Document Reviewed: 05/19/2015 Elsevier Interactive Patient Education  2017 [employer] Prevention in the Home Falls can cause injuries. They can happen to people of all ages. There are many things you can do to make your home safe and to help prevent falls. What can I do on the outside of my home? Regularly fix the edges of walkways and driveways and fix any cracks. Remove anything that might make you trip as you walk through a door, such as a raised step or threshold. Trim any bushes or trees on the path to your home. Use bright outdoor lighting. Clear any walking paths of anything that might make someone trip, such as rocks or tools. Regularly check to see if handrails are loose or broken. Make sure that both sides of any steps have handrails. Any raised decks and porches should have guardrails on the edges. Have any leaves, snow, or ice cleared regularly. Use sand or salt on walking paths during winter. Clean up any spills in your garage right away. This includes oil or grease spills. What can I do in the bathroom? Use night lights. Install grab bars by the toilet and in the tub and shower. Do not use towel bars as grab  bars. Use non-skid mats or decals in the tub or shower. If you need to sit down in the shower, use a plastic, non-slip stool. Keep the floor dry. Clean up any water that spills on the floor as soon as it happens. Remove soap buildup in the tub or shower regularly. Attach bath mats securely with double-sided non-slip rug tape. Do not have throw rugs and other things on the floor that can make you trip. What can I do in the bedroom? Use night lights. Make sure that you have a light by your bed that is easy to reach. Do not use any sheets or blankets that are too big for your bed. They should not hang down onto the floor. Have a firm chair that has side arms. You can use this for support while you get dressed. Do not have throw rugs and other things on the floor that can make you trip. What can I do in the kitchen? Clean up any spills right away. Avoid walking on wet floors. Keep items that you use a lot in easy-to-reach places. If you need to reach something above you, use a strong step stool that has a grab bar. Keep electrical cords out of the way. Do not use floor polish or wax that makes floors slippery. If you must use wax, use non-skid floor wax. Do not have throw rugs and other things on the floor that can make you trip. What  can I do with my stairs? Do not leave any items on the stairs. Make sure that there are handrails on both sides of the stairs and use them. Fix handrails that are broken or loose. Make sure that handrails are as long as the stairways. Check any carpeting to make sure that it is firmly attached to the stairs. Fix any carpet that is loose or worn. Avoid having throw rugs at the top or bottom of the stairs. If you do have throw rugs, attach them to the floor with carpet tape. Make sure that you have a light switch at the top of the stairs and the bottom of the stairs. If you do not have them, ask someone to add them for you. What else can I do to help prevent  falls? Wear shoes that: Do not have high heels. Have rubber bottoms. Are comfortable and fit you well. Are closed at the toe. Do not wear sandals. If you use a stepladder: Make sure that it is fully opened. Do not climb a closed stepladder. Make sure that both sides of the stepladder are locked into place. Ask someone to hold it for you, if possible. Clearly mark and make sure that you can see: Any grab bars or handrails. First and last steps. Where the edge of each step is. Use tools that help you move around (mobility aids) if they are needed. These include: Canes. Walkers. Scooters. Crutches. Turn on the lights when you go into a dark area. Replace any light bulbs as soon as they burn out. Set up your furniture so you have a clear path. Avoid moving your furniture around. If any of your floors are uneven, fix them. If there are any pets around you, be aware of where they are. Review your medicines with your doctor. Some medicines can make you feel dizzy. This can increase your chance of falling. Ask your doctor what other things that you can do to help prevent falls. This information is not intended to replace advice given to you by your health care provider. Make sure you discuss any questions you have with your health care provider. Document Released: 05/14/2009 Document Revised: 12/24/2015 Document Reviewed: 08/22/2014 Elsevier Interactive Patient Education  2017 [employer].

## 2021-06-10 NOTE — Progress Notes (Signed)
Subjective:   [name] is a 80 y.o. female who presents for an Initial Medicare Annual Wellness Visit. Virtual Visit via Telephone Note  I connected with  Sharlyne Cai on 06/10/21 at  9:45 AM EST by telephone and verified that I am speaking with the correct person using two identifiers.  Location: Patient: Home Provider: [facility] Persons participating in the virtual visit: patient/Nurse Health Advisor   I discussed the limitations, risks, security and privacy concerns of performing an evaluation and management service by telephone and the availability of in person appointments. The patient expressed understanding and agreed to proceed.  Interactive audio and video telecommunications were attempted between this nurse and patient, however failed, due to patient having technical difficulties OR patient did not have access to video capability.  We continued and completed visit with audio only.  Some vital signs may be absent or patient reported.   Chriss Driver, LPN  Review of Systems     Cardiac Risk Factors include: advanced age (>88men, >73 women)     Objective:    Today's Vitals   06/10/21 0942  Weight: 181 lb (82.1 kg)  Height: 5' 6.5" (1.689 m)   Body mass index is 28.78 kg/m.  Advanced Directives 06/10/2021 02/27/2020 04/16/2018 02/23/2018 02/20/2018 12/23/2016 12/24/2015  Does Patient Have a Medical Advance Directive? No No No No No No No  Type of Advance Directive - - - - - - -  Copy of Healthcare Power of Attorney in Chart? - - - - - - -  Would patient like information on creating a medical advance directive? No - Patient declined No - Patient declined No - Patient declined Yes (Inpatient - patient defers creating a medical advance directive at this time) No - Patient declined - No - patient declined information    Current Medications (verified) Outpatient Encounter Medications as of 06/10/2021  Medication Sig   albuterol (PROVENTIL) (2.5 MG/3ML) 0.083%  nebulizer solution USE 1 VIAL VIA NEBULIZER EVERY 6 HOURS AS NEEDED FOR WHEEZING OR FOR SHORTNESS OF BREATH   albuterol (VENTOLIN HFA) 108 (90 Base) MCG/ACT inhaler INHALE 2 PUFFS INTO LUNGS EVERY 6 HOURS AS NEEDED FOR WHEEZE/SHORTNESS OF BREATH   cromolyn (OPTICROM) 4 % ophthalmic solution INSTILL 1 DROP INTO BOTH EYES 4 TIMES A DAY   escitalopram (LEXAPRO) 10 MG tablet TAKE 1 TABLET BY MOUTH EVERY DAY   famotidine (PEPCID) 20 MG tablet TAKE 1 TABLET (20 MG TOTAL) BY MOUTH 2 (TWO) TIMES DAILY AS NEEDED FOR HEARTBURN OR INDIGESTION.   fluticasone (FLONASE) 50 MCG/ACT nasal spray SPRAY 2 SPRAYS INTO EACH NOSTRIL EVERY DAY   gabapentin (NEURONTIN) 100 MG capsule TAKE 1 CAPSULE BY MOUTH IN THE MORNING AND 2 CAPSULES AT BEDTIME.   HYDROcodone-acetaminophen (NORCO/VICODIN) 5-325 MG tablet Take 1 tablet by mouth 2 (two) times daily as needed.   levocetirizine (XYZAL) 5 MG tablet TAKE 1 TABLET BY MOUTH EVERY DAY IN THE EVENING   montelukast (SINGULAIR) 10 MG tablet TAKE 1 TABLET BY MOUTH EVERYDAY AT BEDTIME   Multiple Vitamin (MULTIVITAMIN WITH MINERALS) TABS tablet Take 1 tablet by mouth at bedtime. CENTRUM FOR WOMEN 50+   ofloxacin (OCUFLOX) 0.3 % ophthalmic solution Place 1 drop into both eyes 4 (four) times daily.   omeprazole (PRILOSEC) 20 MG capsule TAKE 1 CAPSULE BY MOUTH EVERY DAY   RESTASIS 0.05 % ophthalmic emulsion 1 drop 2 (two) times daily.   vitamin B-12 (CYANOCOBALAMIN) 1000 MCG tablet Take 1,000 mcg by mouth at  bedtime.   No facility-administered encounter medications on file as of 06/10/2021.    Allergies (verified) Penicillins   History: Past Medical History:  Diagnosis Date   Anemia    Arthritis    "hands, feet; shoulders; all over" (02/23/2018)   Asthma    "as a child; acts up a little sometimes" (02/23/2018)   Breast cancer, left breast ([place]) 1967   Cancer of right breast ([place]) 2003   Depression    Diverticulosis    GERD (gastroesophageal reflux disease)    History of  blood transfusion 2003   "several; before my right breast OR"   History of hiatal hernia    Hypertension    after weight loss, pt came off medications and has not had it since   Lymphedema of left arm    Osteoporosis    Pneumonia    "once" (02/23/2018)   Vitamin D deficiency    Past Surgical History:  Procedure Laterality Date   ABDOMINAL HYSTERECTOMY     APPENDECTOMY     BASAL CELL CARCINOMA EXCISION Right    "top of my forehead"   BREAST BIOPSY Left 1967   BREAST BIOPSY Right 2003   CATARACT EXTRACTION W/ INTRAOCULAR LENS  IMPLANT, BILATERAL Bilateral    CHOLECYSTECTOMY OPEN     COLONOSCOPY     JOINT REPLACEMENT     MASTECTOMY Left 1967   MASTECTOMY Right 2003   REVERSE SHOULDER ARTHROPLASTY Left 02/23/2018   REVERSE SHOULDER ARTHROPLASTY Left 02/23/2018   Procedure: REVERSE LEFT SHOULDER ARTHROPLASTY;  Surgeon: Netta Cedars, MD;  Location: [facility];  Service: Orthopedics;  Laterality: Left;   TONSILLECTOMY     TOTAL KNEE ARTHROPLASTY Bilateral    Family History  Problem Relation Age of Onset   Asthma Mother    COPD Mother    Congestive Heart Failure Father    Heart disease Father    Breast cancer Sister 13   Social History   Socioeconomic History   Marital status: Divorced    Spouse name: Not on file   Number of children: 2   Years of education: Not on file   Highest education level: Not on file  Occupational History   Not on file  Tobacco Use   Smoking status: Former    Packs/day: 0.75    Years: 4.00    Pack years: 3.00    Types: Cigarettes    Quit date: 36    Years since quitting: 64.9   Smokeless tobacco: Never  Vaping Use   Vaping Use: Never used  Substance and Sexual Activity   Alcohol use: Not Currently    Alcohol/week: 0.0 standard drinks    Comment: 02/23/2018 "glass of wine 1-2 times/yr; if that"   Drug use: Never   Sexual activity: Not Currently  Other Topics Concern   Not on file  Social History Narrative   1 son and 1 daughter.   5  grandchildren   73 great grandchildren   Social Determinants of Radio broadcast assistant Strain: Low Risk    Difficulty of Paying Living Expenses: Not hard at all  Food Insecurity: No Food Insecurity   Worried About Charity fundraiser in the Last Year: Never true   Arboriculturist in the Last Year: Never true  Transportation Needs: No Transportation Needs   Lack of Transportation (Medical): No   Lack of Transportation (Non-Medical): No  Physical Activity: Insufficiently Active   Days of Exercise per Week: 5 days   Minutes  of Exercise per Session: 20 min  Stress: No Stress Concern Present   Feeling of Stress : Not at all  Social Connections: Moderately Integrated   Frequency of Communication with Friends and Family: More than three times a week   Frequency of Social Gatherings with Friends and Family: More than three times a week   Attends Religious Services: More than 4 times per year   Active Member of [employer] or Organizations: Yes   Attends Music therapist: More than 4 times per year   Marital Status: Divorced    Tobacco Counseling Counseling given: Not Answered   Clinical Intake:  Pre-visit preparation completed: Yes  Pain : No/denies pain     BMI - recorded: 28.78 Nutritional Status: BMI 25 -29 Overweight Nutritional Risks: None Diabetes: No  How often do you need to have someone help you when you read instructions, pamphlets, or other written materials from your doctor or pharmacy?: 1 - Never  Diabetic?no  Interpreter Needed?: No  Information entered by :: MJ Draken Farrior, LPN   Activities of Daily Living In your present state of health, do you have any difficulty performing the following activities: 06/10/2021 04/13/2021  Hearing? N N  Vision? N N  Difficulty concentrating or making decisions? N N  Walking or climbing stairs? N N  Dressing or bathing? N N  Doing errands, shopping? N N  Preparing Food and eating ? N -  Using the Toilet? N -   In the past six months, have you accidently leaked urine? Y -  Comment Urgency. -  Do you have problems with loss of bowel control? N -  Managing your Medications? N -  Managing your Finances? N -  Housekeeping or managing your Housekeeping? N -  Some recent data might be hidden    Patient Care Team: Susy Frizzle, MD as PCP - General (Family Medicine)  Indicate any recent Medical Services you may have received from other than [facility] providers in the past year (date may be approximate).     Assessment:   This is a routine wellness examination for Rafael Hernandez.  Hearing/Vision screen Hearing Screening - Comments:: No hearing issues. Vision Screening - Comments:: Glasses/Readers. [facility].   Dietary issues and exercise activities discussed: Current Exercise Habits: Home exercise routine, Type of exercise: walking, Time (Minutes): 20, Frequency (Times/Week): 5, Weekly Exercise (Minutes/Week): 100, Intensity: Mild   Goals Addressed             This Visit's Progress    Have 3 meals a day       Pt states she wants to eat healthier and keep her weight down.        Depression Screen PHQ 2/9 Scores 06/10/2021 04/13/2021 02/17/2020 10/05/2017 05/04/2017 11/01/2016 10/26/2016  PHQ - 2 Score 0 0 0 0 0 0 0  PHQ- 9 Score - - - - - 0 0    Fall Risk Fall Risk  06/10/2021 04/13/2021 10/05/2017 05/04/2017 11/01/2016  Falls in the past year? 0 0 No No No  Number falls in past yr: 0 0 - - -  Injury with Fall? 0 0 - - -  Risk for fall due to : Impaired vision;Impaired balance/gait - - - -  Follow up Falls prevention discussed Falls evaluation completed - - -    FALL RISK PREVENTION PERTAINING TO THE HOME:  Any stairs in or around the home? No  If so, are there any without handrails? No  Home free of loose throw  rugs in walkways, pet beds, electrical cords, etc? Yes  Adequate lighting in your home to reduce risk of falls? Yes   ASSISTIVE DEVICES UTILIZED TO PREVENT FALLS:  Life  alert? No  Use of a cane, walker or w/c? Yes  Grab bars in the bathroom? Yes  Shower chair or bench in shower? No  Elevated toilet seat or a handicapped toilet? Yes   TIMED UP AND GO:  Was the test performed? No . Phone visit.   Cognitive Function:     6CIT Screen 06/10/2021  What Year? 0 points  What month? 0 points  What time? 0 points  Count back from 20 0 points  Months in reverse 2 points  Repeat phrase 0 points  Total Score 2    Immunizations Immunization History  Administered Date(s) Administered   Influenza Whole 05/30/2007, 06/01/2010   Influenza,inj,Quad PF,6+ Mos 07/01/2014, 06/19/2015   Pneumococcal Conjugate-13 07/01/2014   Pneumococcal Polysaccharide-23 10/05/2017    TDAP status: Due, Education has been provided regarding the importance of this vaccine. Advised may receive this vaccine at local pharmacy or Health Dept. Aware to provide a copy of the vaccination record if obtained from local pharmacy or Health Dept. Verbalized acceptance and understanding.  Flu Vaccine status: Declined, Education has been provided regarding the importance of this vaccine but patient still declined. Advised may receive this vaccine at local pharmacy or Health Dept. Aware to provide a copy of the vaccination record if obtained from local pharmacy or Health Dept. Verbalized acceptance and understanding.  Pneumococcal vaccine status: Up to date  Covid-19 vaccine status: Declined, Education has been provided regarding the importance of this vaccine but patient still declined. Advised may receive this vaccine at local pharmacy or Health Dept.or vaccine clinic. Aware to provide a copy of the vaccination record if obtained from local pharmacy or Health Dept. Verbalized acceptance and understanding.  Qualifies for Shingles Vaccine? Yes   Zostavax completed No   Shingrix Completed?: No.    Education has been provided regarding the importance of this vaccine. Patient has been advised to  call insurance company to determine out of pocket expense if they have not yet received this vaccine. Advised may also receive vaccine at local pharmacy or Health Dept. Verbalized acceptance and understanding.  Screening Tests Health Maintenance  Topic Date Due   COVID-19 Vaccine (1) Never done   TETANUS/TDAP  Never done   Zoster Vaccines- Shingrix (1 of 2) Never done   INFLUENZA VACCINE  03/01/2021   Pneumonia Vaccine 56+ Years old  Completed   DEXA SCAN  Completed   HPV VACCINES  Aged Out    Health Maintenance  Health Maintenance Due  Topic Date Due   COVID-19 Vaccine (1) Never done   TETANUS/TDAP  Never done   Zoster Vaccines- Shingrix (1 of 2) Never done   INFLUENZA VACCINE  03/01/2021    Colorectal cancer screening: No longer required.   Mammogram status: No longer required due to age.  Bone Density status: Completed 08/02/2007. Results reflect: Bone density results: NORMAL. Repeat every 2 years.  Lung Cancer Screening: (Low Dose CT Chest recommended if Age 73-80 years, 30 pack-year currently smoking OR have quit w/in 15years.) does not qualify.  Quit over 50 years ago.   Additional Screening:  Hepatitis C Screening: does not qualify.  Vision Screening: Recommended annual ophthalmology exams for early detection of glaucoma and other disorders of the eye. Is the patient up to date with their annual eye exam?  Yes  Who is the provider or what is the name of the office in which the patient attends annual eye exams? [facility].  If pt is not established with a provider, would they like to be referred to a provider to establish care? No .   Dental Screening: Recommended annual dental exams for proper oral hygiene  Community Resource Referral / Chronic Care Management: CRR required this visit?  No   CCM required this visit?  No      Plan:     I have personally reviewed and noted the following in the patient's chart:   Medical and social history Use of  alcohol, tobacco or illicit drugs  Current medications and supplements including opioid prescriptions. Patient is currently taking opioid prescriptions. Information provided to patient regarding non-opioid alternatives. Patient advised to discuss non-opioid treatment plan with their provider. Functional ability and status Nutritional status Physical activity Advanced directives List of other physicians Hospitalizations, surgeries, and ER visits in previous 12 months Vitals Screenings to include cognitive, depression, and falls Referrals and appointments  In addition, I have reviewed and discussed with patient certain preventive protocols, quality metrics, and best practice recommendations. A written personalized care plan for preventive services as well as general preventive health recommendations were provided to patient.     Chriss Driver, LPN   18/29/9371   Nurse Notes: Pt declines bone density at this time. Mammograms and Colonoscopy no longer done due to age per pt. Discussed shingles and Covid vaccines. Pt declines flu vaccine due to previous reaction to vaccine.

## 2021-07-22 ENCOUNTER — Other Ambulatory Visit: Payer: Self-pay

## 2021-07-22 MED ORDER — HYDROCODONE-ACETAMINOPHEN 5-325 MG PO TABS: 1.0000 | ORAL_TABLET | Freq: Two times a day (BID) | ORAL | 0 refills | Status: DC | PRN

## 2021-07-22 NOTE — Telephone Encounter (Signed)
Pt called in requesting a refill of HYDROcodone-acetaminophen (NORCO/VICODIN) 5-325 MG tablet and gabapentin (NEURONTIN) 100 MG   Cb#: [phone]

## 2021-07-22 NOTE — Telephone Encounter (Signed)
LOV 04/13/21 Last refill 05/24/21, #60, 0 refills  Please review, thanks!

## 2021-08-06 ENCOUNTER — Other Ambulatory Visit: Payer: Self-pay | Admitting: Family Medicine

## 2021-09-15 ENCOUNTER — Other Ambulatory Visit: Payer: Self-pay | Admitting: Family Medicine

## 2021-09-15 NOTE — Telephone Encounter (Signed)
LOV 04/13/21 Last refill 07/22/21, #60, 0 refills  Please review, thanks!

## 2021-09-15 NOTE — Telephone Encounter (Signed)
Patient called to request refill of   HYDROcodone-acetaminophen (NORCO/VICODIN) 5-325 MG tablet [[id number]]   Pharmacy confirmed as  [id number] [place], [place]  [address] [place] [address]  Phone:  [phone]  Fax:  [phone]  DEA #:  [id number]  Please advise at [phone]

## 2021-09-16 MED ORDER — HYDROCODONE-ACETAMINOPHEN 5-325 MG PO TABS: 1.0000 | ORAL_TABLET | Freq: Two times a day (BID) | ORAL | 0 refills | Status: DC | PRN

## 2021-09-21 ENCOUNTER — Other Ambulatory Visit: Payer: Self-pay | Admitting: Family Medicine

## 2021-09-21 NOTE — Telephone Encounter (Signed)
Patient called to follow up on refill request for  HYDROcodone-acetaminophen (NORCO/VICODIN) 5-325 MG tablet [[id number]]   Patient notified by pharmacy they are completely out of stock; wants to know if a different pharmacy in the area has it in stock.   Pharmacy confirmed as   [id number] [place], [place]  [address] [place] [address]  Phone:  [phone]  Fax:  [phone]  DEA #:  [id number]  Please advise at [phone].

## 2021-09-21 NOTE — Telephone Encounter (Signed)
Spoke with patient and advised we cannot tell which pharmacies have medications in stock. She is more than welcome to call around to other pharmacies and try to locate one that has her medication in stock. We can certainly see about sending her rx to another pharmacy. Pt will try to find a pharmacy tomorrow and will call us back with this information.

## 2021-09-27 ENCOUNTER — Other Ambulatory Visit: Payer: Self-pay | Admitting: Family Medicine

## 2021-09-27 MED ORDER — HYDROCODONE-ACETAMINOPHEN 7.5-325 MG PO TABS: 1.0000 | ORAL_TABLET | Freq: Four times a day (QID) | ORAL | 0 refills | Status: DC | PRN

## 2021-09-27 NOTE — Telephone Encounter (Signed)
Patient called back she states [facility] on [address] has this medication.  CB# [phone]

## 2021-09-27 NOTE — Telephone Encounter (Signed)
Patient would like refill on Norco sent to [facility]. [facility].   Please review, thanks!

## 2021-09-27 NOTE — Addendum Note (Signed)
Addended by: Wadie Lessen on: 09/27/2021 03:12 PM   Modules accepted: Orders

## 2021-09-30 ENCOUNTER — Telehealth: Payer: Self-pay | Admitting: Family Medicine

## 2021-09-30 NOTE — Telephone Encounter (Signed)
Patient called to request refill of HYDROcodone-acetaminophen (NORCO/VICODIN) 5-325 MG tablet [[id number]] .  ? ?[facility] [address]. Patient states pharmacy out of medication. [facility] [address] has it in stock. Requesting for Rx to be sent to them.  ? ?Please advise at [phone]. ?

## 2021-09-30 NOTE — Telephone Encounter (Signed)
Patient's pharmacy sent [place] notification they were out of Norco 5/325. Dr. Dennard Schaumann sent over new rx for Norco 7.5/325, this is ready and available for pick up. ? ?[place] ?

## 2021-10-12 ENCOUNTER — Other Ambulatory Visit: Payer: Self-pay

## 2021-10-12 ENCOUNTER — Ambulatory Visit (INDEPENDENT_AMBULATORY_CARE_PROVIDER_SITE_OTHER): Payer: PPO | Admitting: Family Medicine

## 2021-10-12 ENCOUNTER — Encounter: Payer: Self-pay | Admitting: Family Medicine

## 2021-10-12 VITALS — BP 118/82 | HR 44 | Temp 97.0°F | Resp 18 | Ht 66.5 in | Wt 186.0 lb

## 2021-10-12 DIAGNOSIS — E559 Vitamin D deficiency, unspecified: Secondary | ICD-10-CM | POA: Diagnosis not present

## 2021-10-12 DIAGNOSIS — I890 Lymphedema, not elsewhere classified: Secondary | ICD-10-CM | POA: Diagnosis not present

## 2021-10-12 DIAGNOSIS — M159 Polyosteoarthritis, unspecified: Secondary | ICD-10-CM | POA: Diagnosis not present

## 2021-10-12 NOTE — Progress Notes (Signed)
? ?Subjective:  ? ? Patient ID: [name], female    DOB: 10/18/1940, 81 y.o.   MRN: [id number] ? ?HPI  ?04/2021 ?Patient is a very sweet 81 year old Caucasian female who comes in today for a checkup.  She has a history of arthritis in her hands and in her shoulders and in her feet and knees.  She takes 1 hydrocodone every day at night to alleviate the pain so that she can go to sleep.  Specifically she states that the PIP and DIP joints which are dysmorphic and features today on exam ache and throb at night and keep her awake.  If she takes the hydrocodone she is able to go to sleep.  She also has chronic pain in her right shoulder that required shoulder replacement.  She also deals with pain in her feet and in her knees.  She shows no evidence of abuse or diversion.  She denies any falls or memory loss or dizziness on the medication.  She does take a stool softener to prevent constipation.  She is also on gabapentin for neuropathy in her feet.  She states that she primarily has numbness in her feet.  She denies any burning or stinging pain.  She is not sure if the gabapentin really helps.  I recommended that she temporarily stop the medication to see if it is giving her any benefit.  I want to try to limit any centrally acting medication that could affect her balance or her memory.  If she notices burning pain in her feet she can certainly use the gabapentin as needed however she denies any pain today in her feet related to neuropathy.  She also has a history of asthma and right now she is using the albuterol sparingly.  She states that she uses it mainly during weather changes or with allergies but she is not using it on a daily basis.  Her review of systems is otherwise negative.  She denies any chest pain or shortness of breath or dyspnea on exertion.  She denies any melena or hematochezia.  She denies any stomach pain.  Overall she is doing well.  She refuses a flu shot due to a previous allergic  reaction and she is also afraid to get the COVID-vaccine for the same reason.   ?At that time, my plan was: ?Overall the patient is doing well.  I have no concern about abuse or diversion.  I will gladly refill her hydrocodone that she uses 1 tablet at night to help her sleep.  I recommended that she temporarily discontinue gabapentin to see if the medicine is truly helping neuropathy.  I want to avoid unnecessary sedating medications given her age.  I recommended that she continue a stool softener to avoid constipation on the narcotic.  I recommended the COVID-vaccine but she declined.  She does not also want to try the flu shot either.  I will check a CBC and a CMP today.  Due to age, she does not require further cancer screening ? ?10/12/21 ?Patient is here today for a checkup.  Overall she states she is doing well.  She states that she feels the best she has felt in a long time.  She has been taking balance of nature supplements and she feels that this is really helping her.  She denies any chest pain or shortness of breath or cough or wheezing.  She denies any abdominal pain or nausea or vomiting or melena or hematochezia.  She does  have some mild lymphedema in her left arm however she is not even wrapping her left arm today as the swelling is mild.  She continues to deal with arthritis in her hands and feet and in her back but the pain medication helps manage this.  She is still taking gabapentin at night to help her sleep as it calms the neuropathy in her feet.  She denies any memory loss or confusion or depression or falls. ?Past Medical History:  ?Diagnosis Date  ? Anemia   ? Arthritis   ? "hands, feet; shoulders; all over" (02/23/2018)  ? Asthma   ? "as a child; acts up a little sometimes" (02/23/2018)  ? Breast cancer, left breast ([place]) 1967  ? Cancer of right breast [facility]) 2003  ? Depression   ? Diverticulosis   ? GERD (gastroesophageal reflux disease)   ? History of blood transfusion 2003  ? "several;  before my right breast OR"  ? History of hiatal hernia   ? Hypertension   ? after weight loss, pt came off medications and has not had it since  ? Lymphedema of left arm   ? Osteoporosis   ? Pneumonia   ? "once" (02/23/2018)  ? Vitamin D deficiency   ? ?Past Surgical History:  ?Procedure Laterality Date  ? ABDOMINAL HYSTERECTOMY    ? APPENDECTOMY    ? BASAL CELL CARCINOMA EXCISION Right   ? "top of my forehead"  ? BREAST BIOPSY Left 1967  ? BREAST BIOPSY Right 2003  ? CATARACT EXTRACTION W/ INTRAOCULAR LENS  IMPLANT, BILATERAL Bilateral   ? CHOLECYSTECTOMY OPEN    ? COLONOSCOPY    ? JOINT REPLACEMENT    ? MASTECTOMY Left 1967  ? MASTECTOMY Right 2003  ? REVERSE SHOULDER ARTHROPLASTY Left 02/23/2018  ? REVERSE SHOULDER ARTHROPLASTY Left 02/23/2018  ? Procedure: REVERSE LEFT SHOULDER ARTHROPLASTY;  Surgeon: Netta Cedars, MD;  Location: [facility];  Service: Orthopedics;  Laterality: Left;  ? TONSILLECTOMY    ? TOTAL KNEE ARTHROPLASTY Bilateral   ? ?Current Outpatient Medications on File Prior to Visit  ?Medication Sig Dispense Refill  ? albuterol (PROVENTIL) (2.5 MG/3ML) 0.083% nebulizer solution USE 1 VIAL VIA NEBULIZER EVERY 6 HOURS AS NEEDED FOR WHEEZING OR FOR SHORTNESS OF BREATH 75 mL 1  ? albuterol (VENTOLIN HFA) 108 (90 Base) MCG/ACT inhaler INHALE 2 PUFFS INTO LUNGS EVERY 6 HOURS AS NEEDED FOR WHEEZE/SHORTNESS OF BREATH 17 each 3  ? escitalopram (LEXAPRO) 10 MG tablet TAKE 1 TABLET BY MOUTH EVERY DAY 90 tablet 2  ? famotidine (PEPCID) 20 MG tablet TAKE 1 TABLET (20 MG TOTAL) BY MOUTH 2 (TWO) TIMES DAILY AS NEEDED FOR HEARTBURN OR INDIGESTION. 180 tablet 1  ? fluticasone (FLONASE) 50 MCG/ACT nasal spray SPRAY 2 SPRAYS INTO EACH NOSTRIL EVERY DAY 48 mL 2  ? gabapentin (NEURONTIN) 100 MG capsule TAKE 1 CAPSULE BY MOUTH IN THE MORNING AND 2 CAPSULES AT BEDTIME. 90 capsule 3  ? HYDROcodone-acetaminophen (NORCO) 7.5-325 MG tablet Take 1 tablet by mouth every 6 (six) hours as needed for moderate pain. 30 tablet 0  ?  Multiple Vitamin (MULTIVITAMIN WITH MINERALS) TABS tablet Take 1 tablet by mouth at bedtime. CENTRUM FOR WOMEN 50+    ? omeprazole (PRILOSEC) 20 MG capsule TAKE 1 CAPSULE BY MOUTH EVERY DAY 90 capsule 3  ? RESTASIS 0.05 % ophthalmic emulsion 1 drop 2 (two) times daily.    ? vitamin B-12 (CYANOCOBALAMIN) 1000 MCG tablet Take 1,000 mcg by mouth at bedtime.    ?  levocetirizine (XYZAL) 5 MG tablet TAKE 1 TABLET BY MOUTH EVERY DAY IN THE EVENING (Patient not taking: Reported on 10/12/2021) 90 tablet 0  ? montelukast (SINGULAIR) 10 MG tablet TAKE 1 TABLET BY MOUTH EVERYDAY AT BEDTIME (Patient not taking: Reported on 10/12/2021) 90 tablet 1  ? ?No current facility-administered medications on file prior to visit.  ? ?Allergies  ?Allergen Reactions  ? Penicillins Hives, Rash and Other (See Comments)  ?  PATIENT HAS HAD A PCN REACTION WITH IMMEDIATE RASH, FACIAL/TONGUE/THROAT SWELLING, SOB, OR LIGHTHEADEDNESS WITH HYPOTENSION:  #  #  YES  #  #  ?Has patient had a PCN reaction causing severe rash involving mucus membranes or skin necrosis: No ?Has patient had a PCN reaction that required hospitalization: No ?Has patient had a PCN reaction occurring within the last 10 years: No ?If all of the above answers are "NO", then may proceed with Cephalosporin use. ?  ? ?Social History  ? ?Socioeconomic History  ? Marital status: Divorced  ?  Spouse name: Not on file  ? Number of children: 2  ? Years of education: Not on file  ? Highest education level: Not on file  ?Occupational History  ? Not on file  ?Tobacco Use  ? Smoking status: Former  ?  Packs/day: 0.75  ?  Years: 4.00  ?  Pack years: 3.00  ?  Types: Cigarettes  ?  Quit date: 35  ?  Years since quitting: 65.2  ? Smokeless tobacco: Never  ?Vaping Use  ? Vaping Use: Never used  ?Substance and Sexual Activity  ? Alcohol use: Not Currently  ?  Alcohol/week: 0.0 standard drinks  ?  Comment: 02/23/2018 "glass of wine 1-2 times/yr; if that"  ? Drug use: Never  ? Sexual activity: Not  Currently  ?Other Topics Concern  ? Not on file  ?Social History Narrative  ? 1 son and 1 daughter.  ? 5 grandchildren  ? 2 great grandchildren  ? ?Social Determinants of Health  ? ?Financial Resource Strain: Low Risk

## 2021-10-13 LAB — COMPLETE METABOLIC PANEL WITH GFR
AG Ratio: 1.7 (calc) (ref 1.0–2.5)
ALT: 12 U/L (ref 6–29)
AST: 19 U/L (ref 10–35)
Albumin: 3.8 g/dL (ref 3.6–5.1)
Alkaline phosphatase (APISO): 56 U/L (ref 37–153)
BUN/Creatinine Ratio: 13 (calc) (ref 6–22)
BUN: 7 mg/dL (ref 7–25)
CO2: 34 mmol/L — ABNORMAL HIGH (ref 20–32)
Calcium: 9.2 mg/dL (ref 8.6–10.4)
Chloride: 100 mmol/L (ref 98–110)
Creat: 0.53 mg/dL — ABNORMAL LOW (ref 0.60–0.95)
Globulin: 2.2 g/dL (calc) (ref 1.9–3.7)
Glucose, Bld: 92 mg/dL (ref 65–99)
Potassium: 4.5 mmol/L (ref 3.5–5.3)
Sodium: 138 mmol/L (ref 135–146)
Total Bilirubin: 0.6 mg/dL (ref 0.2–1.2)
Total Protein: 6 g/dL — ABNORMAL LOW (ref 6.1–8.1)
eGFR: 92 mL/min/{1.73_m2} (ref >=60)

## 2021-10-13 LAB — CBC WITH DIFFERENTIAL/PLATELET
Absolute Monocytes: 378 cells/uL (ref 200–950)
Basophils Absolute: 38 cells/uL (ref 0–200)
Basophils Relative: 0.6 %
Eosinophils Absolute: 290 cells/uL (ref 15–500)
Eosinophils Relative: 4.6 %
HCT: 42.1 % (ref 35.0–45.0)
Hemoglobin: 14.1 g/dL (ref 11.7–15.5)
Lymphs Abs: 1304 cells/uL (ref 850–3900)
MCH: 32.1 pg (ref 27.0–33.0)
MCHC: 33.5 g/dL (ref 32.0–36.0)
MCV: 95.9 fL (ref 80.0–100.0)
MPV: 9.6 fL (ref 7.5–12.5)
Monocytes Relative: 6 %
Neutro Abs: 4290 cells/uL (ref 1500–7800)
Neutrophils Relative %: 68.1 %
Platelets: 217 10*3/uL (ref 140–400)
RBC: 4.39 10*6/uL (ref 3.80–5.10)
RDW: 11.7 % (ref 11.0–15.0)
Total Lymphocyte: 20.7 %
WBC: 6.3 10*3/uL (ref 3.8–10.8)

## 2021-10-28 ENCOUNTER — Other Ambulatory Visit: Payer: Self-pay | Admitting: Family Medicine

## 2021-10-28 NOTE — Telephone Encounter (Signed)
Patient called requesting refill on her hydrocodone. [facility] [address] ? ?CB# [phone] ?

## 2021-10-29 MED ORDER — HYDROCODONE-ACETAMINOPHEN 7.5-325 MG PO TABS: 1.0000 | ORAL_TABLET | Freq: Four times a day (QID) | ORAL | 0 refills | Status: DC | PRN

## 2021-10-29 NOTE — Telephone Encounter (Signed)
LOV 10/12/21 ?Last refill 09/27/21, #30, 0 refills ? ?Please review, thanks! ? ? ?

## 2021-10-29 NOTE — Telephone Encounter (Signed)
Orders sent to Dr. Dennard Schaumann ?

## 2021-11-03 NOTE — Telephone Encounter (Signed)
Rx sent to pharmacy   

## 2021-11-08 MED ORDER — HYDROCODONE-ACETAMINOPHEN 7.5-325 MG PO TABS: 1.0000 | ORAL_TABLET | Freq: Four times a day (QID) | ORAL | 0 refills | Status: DC | PRN

## 2021-11-08 NOTE — Telephone Encounter (Signed)
Please resend per pt ? ?LOV 10/12/21 ?Last refill 09/27/21, #30, 0 refills ?  ?Please review, thanks! ?  ?

## 2021-11-08 NOTE — Addendum Note (Signed)
Addended by: Colman Cater on: 11/08/2021 04:52 PM ? ? Modules accepted: Orders ? ?

## 2021-11-08 NOTE — Telephone Encounter (Signed)
Patient called to follow up on refill request for hydrocodone; [facility] [address] [facility] [address] [facility]; it has to be called in. Please advise pharmacist at [phone]. ? ? ?

## 2021-12-01 ENCOUNTER — Other Ambulatory Visit: Payer: Self-pay

## 2021-12-01 NOTE — Telephone Encounter (Signed)
Pt called in requesting a refill of HYDROcodone-acetaminophen (NORCO) 7.5-325 MG tablet. Pt stated that she understands that there may be a shortage of this med at [facility] [address]. Pt states that she will call pharmacy to see if they have this med as well ? Please advise. ? ?Cb#: [phone] ?

## 2021-12-02 MED ORDER — HYDROCODONE-ACETAMINOPHEN 7.5-325 MG PO TABS: 1.0000 | ORAL_TABLET | Freq: Four times a day (QID) | ORAL | 0 refills | Status: DC | PRN

## 2021-12-02 NOTE — Telephone Encounter (Signed)
LOV 10/12/21 ?Last refill 11/08/21, #30, 0 refills ? ?Please review, thanks! ? ?

## 2021-12-02 NOTE — Telephone Encounter (Signed)
Requested medication (s) are due for refill today: yes ? ?Requested medication (s) are on the active medication list: yes ? ?Last refill:  11/08/21 #30/0 ? ?Future visit scheduled: no ? ?Notes to clinic:  Unable to refill per protocol, cannot delegate. ? ?  ?Requested Prescriptions  ?Pending Prescriptions Disp Refills  ? HYDROcodone-acetaminophen (NORCO) 7.5-325 MG tablet 30 tablet 0  ?  Sig: Take 1 tablet by mouth every 6 (six) hours as needed for moderate pain.  ?  ? Not Delegated - Analgesics:  Opioid Agonist Combinations Failed - 12/01/2021  4:55 PM  ?  ?  Failed - This refill cannot be delegated  ?  ?  Failed - Urine Drug Screen completed in last 360 days  ?  ?  Passed - Valid encounter within last 3 months  ?  Recent Outpatient Visits   ? ?      ? 1 month ago Lymphedema of left arm  ? [facility], Cammie Mcgee, MD  ? 7 months ago Neuropathy  ? [place] Pickard, Cammie Mcgee, MD  ? 1 year ago Adult general medical exam  ? [place], Crystal A, FNP  ? 3 years ago Acute recurrent pansinusitis  ? [place] Delsa Grana, PA-C  ? 3 years ago Lymphedema of left arm  ? [facility] [place], Cammie Mcgee, MD  ? ?  ?  ? ? ?  ?  ?  ? ?

## 2021-12-03 ENCOUNTER — Other Ambulatory Visit: Payer: Self-pay

## 2021-12-03 MED ORDER — HYDROCODONE-ACETAMINOPHEN 7.5-325 MG PO TABS: 1.0000 | ORAL_TABLET | Freq: Four times a day (QID) | ORAL | 0 refills | Status: DC | PRN

## 2021-12-03 NOTE — Telephone Encounter (Signed)
Requested medications are due for refill today.  yes ? ?Requested medications are on the active medications list.  yes ? ?Last refill. 12/02/2021 #30 0 refills ? ?Future visit scheduled.   With nurse. ? ?Notes to clinic.  Pt called in stating that she recently requested a refill for HYDROcodone-acetaminophen ([place]) 7.5-325 MG tablet to be [facility] [address]. Pt states that this pharmacy currently does not have a supply of this med and would like to send this refill to the [facility]. Please advise. ?  ?Cb#: [phone] ? ? ? ?Requested Prescriptions  ?Pending Prescriptions Disp Refills  ? HYDROcodone-acetaminophen (NORCO) 7.5-325 MG tablet 30 tablet 0  ?  Sig: Take 1 tablet by mouth every 6 (six) hours as needed for moderate pain.  ?  ? Not Delegated - Analgesics:  Opioid Agonist Combinations Failed - 12/03/2021  2:16 PM  ?  ?  Failed - This refill cannot be delegated  ?  ?  Failed - Urine Drug Screen completed in last 360 days  ?  ?  Passed - Valid encounter within last 3 months  ?  Recent Outpatient Visits   ? ?      ? 1 month ago Lymphedema of left arm  ? [facility] [place], Cammie Mcgee, MD  ? 7 months ago Neuropathy  ? [place] Pickard, Cammie Mcgee, MD  ? 1 year ago Adult general medical exam  ? [place], Crystal A, FNP  ? 3 years ago Acute recurrent pansinusitis  ? [place] Delsa Grana, PA-C  ? 3 years ago Lymphedema of left arm  ? [facility] [place], Cammie Mcgee, MD  ? ?  ?  ? ? ?  ?  ?  ?  ?

## 2021-12-03 NOTE — Telephone Encounter (Signed)
Pt called in stating that she recently requested a refill for HYDROcodone-acetaminophen ([place]) 7.5-325 MG tablet to be [facility] [address]. Pt states that this pharmacy currently does not have a supply of this med and would like to send this refill to the [facility] ? ?Rx sent yesterday has been canceled at [facility] ?

## 2021-12-03 NOTE — Telephone Encounter (Signed)
Pt called in stating that she recently requested a refill for HYDROcodone-acetaminophen ([place]) 7.5-325 MG tablet to be [facility] [address]. Pt states that this pharmacy currently does not have a supply of this med and would like to send this refill to the [facility]. Please advise. ? ?Cb#: [phone] ? ?

## 2021-12-28 ENCOUNTER — Other Ambulatory Visit: Payer: Self-pay

## 2021-12-28 ENCOUNTER — Telehealth: Payer: Self-pay

## 2021-12-28 DIAGNOSIS — J441 Chronic obstructive pulmonary disease with (acute) exacerbation: Secondary | ICD-10-CM

## 2021-12-28 DIAGNOSIS — J209 Acute bronchitis, unspecified: Secondary | ICD-10-CM

## 2021-12-28 NOTE — Telephone Encounter (Signed)
I have attempted to contact this patient by phone with no answer/ no vm was set up . Need to confirm her which [facility] to send medicine.

## 2021-12-28 NOTE — Telephone Encounter (Signed)
Pt called in requesting a refill of: HYDROcodone-acetaminophen (NORCO) 7.5-325 MG tablet [[id number]]  gabapentin (NEURONTIN) 300 MG  albuterol (VENTOLIN HFA) 108 (90  Please send these refills to the [facility] [address] in [place]. Please advise  Cb#: [phone]

## 2021-12-29 NOTE — Telephone Encounter (Signed)
Patient called and her friend answered the phone and says patient is asleep. Left message to have patient to call the office to schedule appointment. She says she will let her know.

## 2021-12-30 MED ORDER — ALBUTEROL SULFATE HFA 108 (90 BASE) MCG/ACT IN AERS: INHALATION_SPRAY | RESPIRATORY_TRACT | 0 refills | Status: DC

## 2021-12-30 NOTE — Telephone Encounter (Signed)
Pt called in to check on refills. Pt informed that she needed to make schedule an ov with pcp. Pt is scheduled for 01/11/22. Pt is asking for a refill enough to last until this appt date. [facility] [address] in [place].

## 2021-12-30 NOTE — Telephone Encounter (Signed)
Requested medication (s) are due for refill today: yes  Requested medication (s) are on the active medication list: yes  Last refill:  Norco 12/03/21 #30 with 0 RF and Neurontin '300mg'$  no info about number or  refills, both of these meds are by a Historical Provider, Norco of course is non delegated anyway.  Future visit scheduled: 01/11/22  Notes to clinic:  Pt requesting enough until appt 01/11/22.    Requested Prescriptions  Pending Prescriptions Disp Refills   gabapentin (NEURONTIN) 300 MG capsule      Sig: Take 1 capsule (300 mg total) by mouth at bedtime.     Neurology: Anticonvulsants - gabapentin Failed - 12/30/2021 11:08 AM      Failed - Cr in normal range and within 360 days    Creatinine  Date Value Ref Range Status  12/23/2016 0.7 0.6 - 1.1 mg/dL Final   Creat  Date Value Ref Range Status  10/12/2021 0.53 (L) 0.60 - 0.95 mg/dL Final         Passed - Completed PHQ-2 or PHQ-9 in the last 360 days      Passed - Valid encounter within last 12 months    Recent Outpatient Visits           2 months ago Lymphedema of left arm   [place] Schaumann, Cammie Mcgee, MD   8 months ago Neuropathy   [place] Pickard, Cammie Mcgee, MD   1 year ago Adult general medical exam   [place], [place], [place]   3 years ago Acute recurrent pansinusitis   [place] Delsa Grana, PA-C   3 years ago Lymphedema of left arm   [place] Pickard, Cammie Mcgee, MD       Future Appointments             In 1 week Dennard Schaumann, Cammie Mcgee, MD [place], [facility]              HYDROcodone-acetaminophen (NORCO) 7.5-325 MG tablet 30 tablet 0    Sig: Take 1 tablet by mouth every 6 (six) hours as needed for moderate pain.     Not Delegated - Analgesics:  Opioid Agonist Combinations Failed - 12/30/2021 11:08 AM      Failed - This refill cannot be delegated      Failed - Urine Drug Screen completed  in last 360 days      Passed - Valid encounter within last 3 months    Recent Outpatient Visits           2 months ago Lymphedema of left arm   [place] Dennard Schaumann, Cammie Mcgee, MD   8 months ago Neuropathy   [place] Dennard Schaumann, Cammie Mcgee, MD   1 year ago Adult general medical exam   [place], [place], [place]   3 years ago Acute recurrent pansinusitis   [place] Delsa Grana, PA-C   3 years ago Lymphedema of left arm   [place], Cammie Mcgee, MD       Future Appointments             In 1 week Pickard, Cammie Mcgee, MD [place], [facility]             Signed Prescriptions Disp Refills   albuterol (VENTOLIN HFA) 108 (90 Base) MCG/ACT inhaler 17 each 0  Sig: INHALE 2 PUFFS INTO LUNGS EVERY 6 HOURS AS NEEDED FOR WHEEZE/SHORTNESS OF BREATH     Pulmonology:  Beta Agonists 2 Failed - 12/30/2021 11:08 AM      Failed - Last Heart Rate in normal range    Pulse Readings from Last 1 Encounters:  10/12/21 (!) 44         Passed - Last BP in normal range    BP Readings from Last 1 Encounters:  10/12/21 118/82         Passed - Valid encounter within last 12 months    Recent Outpatient Visits           2 months ago Lymphedema of left arm   [place] Dennard Schaumann, Cammie Mcgee, MD   8 months ago Neuropathy   [place] Dennard Schaumann, Cammie Mcgee, MD   1 year ago Adult general medical exam   [place], [place], [place]   3 years ago Acute recurrent pansinusitis   [place] Delsa Grana, PA-C   3 years ago Lymphedema of left arm   [place] Pickard, Cammie Mcgee, MD       Future Appointments             In 1 week Pickard, Cammie Mcgee, MD [place]

## 2021-12-30 NOTE — Telephone Encounter (Signed)
Neurontin and Norco by Historical Provider, will be sent to Dr Cindi Carbon.

## 2021-12-31 MED ORDER — GABAPENTIN 300 MG PO CAPS: 300.0000 mg | ORAL_CAPSULE | Freq: Every day | ORAL | 0 refills | Status: DC

## 2021-12-31 MED ORDER — HYDROCODONE-ACETAMINOPHEN 7.5-325 MG PO TABS: 1.0000 | ORAL_TABLET | Freq: Four times a day (QID) | ORAL | 0 refills | Status: DC | PRN

## 2021-12-31 NOTE — Telephone Encounter (Signed)
Would you mind calling this pt again for an appt?   Thank you

## 2021-12-31 NOTE — Telephone Encounter (Signed)
LOV 10/12/21 Last refill 12/03/21, #30, 0 refills  Please review, thanks!

## 2022-01-11 ENCOUNTER — Ambulatory Visit (INDEPENDENT_AMBULATORY_CARE_PROVIDER_SITE_OTHER): Payer: PPO | Admitting: Family Medicine

## 2022-01-11 ENCOUNTER — Encounter: Payer: Self-pay | Admitting: Family Medicine

## 2022-01-11 VITALS — BP 126/76 | HR 63 | Temp 97.5°F | Wt 176.0 lb

## 2022-01-11 DIAGNOSIS — J320 Chronic maxillary sinusitis: Secondary | ICD-10-CM

## 2022-01-11 MED ORDER — AZITHROMYCIN 250 MG PO TABS: ORAL_TABLET | ORAL | 0 refills | Status: DC

## 2022-01-11 NOTE — Progress Notes (Signed)
Subjective:    Patient ID: [name], female    DOB: 05-21-1941, 81 y.o.   MRN: [id number]  HPI  Patient believes she has a sinus infection.  She reports that a 10-day history of pain and pressure in her left maxillary sinus.  She denies any rhinorrhea.  She denies fever.  She denies chills.  She does report headache behind her left eye.  She has been taking Flonase every day as she always does for allergies but this has not helped.  She denies any rhinorrhea or postnasal drip or sneezing itchy watery eyes or sore throat.  She does have tenderness to percussion over the left maxillary sinus. Past Medical History:  Diagnosis Date  . Anemia   . Arthritis    "hands, feet; shoulders; all over" (02/23/2018)  . Asthma    "as a child; acts up a little sometimes" (02/23/2018)  . Breast cancer, left breast ([place]) 1967  . Cancer of right breast ([place]) 2003  . Depression   . Diverticulosis   . GERD (gastroesophageal reflux disease)   . History of blood transfusion 2003   "several; before my right breast OR"  . History of hiatal hernia   . Hypertension    after weight loss, pt came off medications and has not had it since  . Lymphedema of left arm   . Osteoporosis   . Pneumonia    "once" (02/23/2018)  . Vitamin D deficiency    Past Surgical History:  Procedure Laterality Date  . ABDOMINAL HYSTERECTOMY    . APPENDECTOMY    . BASAL CELL CARCINOMA EXCISION Right    "top of my forehead"  . BREAST BIOPSY Left 1967  . BREAST BIOPSY Right 2003  . CATARACT EXTRACTION W/ INTRAOCULAR LENS  IMPLANT, BILATERAL Bilateral   . CHOLECYSTECTOMY OPEN    . COLONOSCOPY    . JOINT REPLACEMENT    . MASTECTOMY Left 1967  . MASTECTOMY Right 2003  . REVERSE SHOULDER ARTHROPLASTY Left 02/23/2018  . REVERSE SHOULDER ARTHROPLASTY Left 02/23/2018   Procedure: REVERSE LEFT SHOULDER ARTHROPLASTY;  Surgeon: Netta Cedars, MD;  Location: [facility];  Service: Orthopedics;  Laterality: Left;  . TONSILLECTOMY    .  TOTAL KNEE ARTHROPLASTY Bilateral    Current Outpatient Medications on File Prior to Visit  Medication Sig Dispense Refill  . albuterol (PROVENTIL) (2.5 MG/3ML) 0.083% nebulizer solution USE 1 VIAL VIA NEBULIZER EVERY 6 HOURS AS NEEDED FOR WHEEZING OR FOR SHORTNESS OF BREATH 75 mL 1  . albuterol (VENTOLIN HFA) 108 (90 Base) MCG/ACT inhaler INHALE 2 PUFFS INTO LUNGS EVERY 6 HOURS AS NEEDED FOR WHEEZE/SHORTNESS OF BREATH 17 each 0  . escitalopram (LEXAPRO) 10 MG tablet TAKE 1 TABLET BY MOUTH EVERY DAY 90 tablet 2  . famotidine (PEPCID) 20 MG tablet TAKE 1 TABLET (20 MG TOTAL) BY MOUTH 2 (TWO) TIMES DAILY AS NEEDED FOR HEARTBURN OR INDIGESTION. 180 tablet 1  . fluticasone (FLONASE) 50 MCG/ACT nasal spray SPRAY 2 SPRAYS INTO EACH NOSTRIL EVERY DAY 48 mL 2  . gabapentin (NEURONTIN) 300 MG capsule Take 1 capsule (300 mg total) by mouth at bedtime. 30 capsule 0  . HYDROcodone-acetaminophen (NORCO) 7.5-325 MG tablet Take 1 tablet by mouth every 6 (six) hours as needed for moderate pain. 30 tablet 0  . levocetirizine (XYZAL) 5 MG tablet TAKE 1 TABLET BY MOUTH EVERY DAY IN THE EVENING 90 tablet 0  . montelukast (SINGULAIR) 10 MG tablet TAKE 1 TABLET BY MOUTH EVERYDAY AT BEDTIME 90  tablet 1  . Multiple Vitamin (MULTIVITAMIN WITH MINERALS) TABS tablet Take 1 tablet by mouth at bedtime. CENTRUM FOR WOMEN 50+    . omeprazole (PRILOSEC) 20 MG capsule TAKE 1 CAPSULE BY MOUTH EVERY DAY 90 capsule 3  . RESTASIS 0.05 % ophthalmic emulsion 1 drop 2 (two) times daily.    . vitamin B-12 (CYANOCOBALAMIN) 1000 MCG tablet Take 1,000 mcg by mouth at bedtime.     No current facility-administered medications on file prior to visit.   Allergies  Allergen Reactions  . Penicillins Hives, Rash and Other (See Comments)    PATIENT HAS HAD A PCN REACTION WITH IMMEDIATE RASH, FACIAL/TONGUE/THROAT SWELLING, SOB, OR LIGHTHEADEDNESS WITH HYPOTENSION:  #  #  YES  #  #  Has patient had a PCN reaction causing severe rash involving  mucus membranes or skin necrosis: No Has patient had a PCN reaction that required hospitalization: No Has patient had a PCN reaction occurring within the last 10 years: No If all of the above answers are "NO", then may proceed with Cephalosporin use.    Social History   Socioeconomic History  . Marital status: Divorced    Spouse name: Not on file  . Number of children: 2  . Years of education: Not on file  . Highest education level: Not on file  Occupational History  . Not on file  Tobacco Use  . Smoking status: Former    Packs/day: 0.75    Years: 4.00    Total pack years: 3.00    Types: Cigarettes    Quit date: 1958    Years since quitting: 65.4  . Smokeless tobacco: Never  Vaping Use  . Vaping Use: Never used  Substance and Sexual Activity  . Alcohol use: Not Currently    Alcohol/week: 0.0 standard drinks of alcohol    Comment: 02/23/2018 "glass of wine 1-2 times/yr; if that"  . Drug use: Never  . Sexual activity: Not Currently  Other Topics Concern  . Not on file  Social History Narrative   1 son and 1 daughter.   5 grandchildren   77 great grandchildren   Social Determinants of Health   Financial Resource Strain: Low Risk  (06/10/2021)   Overall Financial Resource Strain (CARDIA)   . Difficulty of Paying Living Expenses: Not hard at all  Food Insecurity: No Food Insecurity (06/10/2021)   Hunger Vital Sign   . Worried About Charity fundraiser in the Last Year: Never true   . Ran Out of Food in the Last Year: Never true  Transportation Needs: No Transportation Needs (06/10/2021)   PRAPARE - Transportation   . Lack of Transportation (Medical): No   . Lack of Transportation (Non-Medical): No  Physical Activity: Insufficiently Active (06/10/2021)   Exercise Vital Sign   . Days of Exercise per Week: 5 days   . Minutes of Exercise per Session: 20 min  Stress: No Stress Concern Present (06/10/2021)   [place]   . Feeling of Stress : Not at all  Social Connections: Moderately Integrated (06/10/2021)   Social Connection and Isolation Panel [NHANES]   . Frequency of Communication with Friends and Family: More than three times a week   . Frequency of Social Gatherings with Friends and Family: More than three times a week   . Attends Religious Services: More than 4 times per year   . Active Member of Clubs or Organizations: Yes   . Attends Club  or Organization Meetings: More than 4 times per year   . Marital Status: Divorced  Human resources officer Violence: Not At Risk (06/10/2021)   Humiliation, Afraid, Rape, and Kick questionnaire   . Fear of Current or Ex-Partner: No   . Emotionally Abused: No   . Physically Abused: No   . Sexually Abused: No      Review of Systems  All other systems reviewed and are negative.      Objective:   Physical Exam Constitutional:      Appearance: She is well-developed.  HENT:     Right Ear: Tympanic membrane and ear canal normal.     Left Ear: Tympanic membrane and ear canal normal.     Nose: Congestion present. No mucosal edema or rhinorrhea.     Right Sinus: No maxillary sinus tenderness or frontal sinus tenderness.     Left Sinus: Maxillary sinus tenderness present. No frontal sinus tenderness.     Mouth/Throat:     Mouth: Mucous membranes are moist.     Pharynx: Oropharynx is clear. No oropharyngeal exudate.  Eyes:     Conjunctiva/sclera: Conjunctivae normal.  Cardiovascular:     Rate and Rhythm: Normal rate and regular rhythm.     Heart sounds: Normal heart sounds. No murmur heard. Pulmonary:     Effort: Pulmonary effort is normal. No respiratory distress.     Breath sounds: Normal breath sounds. No wheezing or rales.  Abdominal:     General: Bowel sounds are normal. There is no distension.     Palpations: Abdomen is soft.     Tenderness: There is no abdominal tenderness. There is no rebound.  Musculoskeletal:     Left shoulder:  Tenderness present. Decreased range of motion. Decreased strength.         Assessment & Plan:  Left maxillary sinusitis Begin a Z-Pak.  Continue Flonase.  Add nasal saline 2-3 times daily to try to improve drainage from the left maxillary sinus.

## 2022-01-20 ENCOUNTER — Other Ambulatory Visit: Payer: Self-pay

## 2022-01-20 NOTE — Telephone Encounter (Signed)
Requested medications are due for refill today.  yes  Requested medications are on the active medications list.  yes  Last refill. 12/31/2021  Future visit scheduled.   no  Notes to clinic.  Medication refill is not delegated.    Requested Prescriptions  Pending Prescriptions Disp Refills   HYDROcodone-acetaminophen (NORCO) 7.5-325 MG tablet 30 tablet 0    Sig: Take 1 tablet by mouth every 6 (six) hours as needed for moderate pain.     Not Delegated - Analgesics:  Opioid Agonist Combinations Failed - 01/20/2022  4:08 PM      Failed - This refill cannot be delegated      Failed - Urine Drug Screen completed in last 360 days      Failed - Valid encounter within last 3 months    Recent Outpatient Visits           3 months ago Lymphedema of left arm   [place] [facility] Schaumann, Cammie Mcgee, MD   9 months ago Neuropathy   [place] Denn[facility] Schaumann, Cammie Mcgee, MD   1 year ago Adult general medical exam   [place], [place], [place]   3 years ago Acute recurrent pansinusitis   [place] Delsa Grana, PA-C   3 years ago Lymphedema of left arm   [place] Pickard, Cammie Mcgee, MD

## 2022-01-27 ENCOUNTER — Other Ambulatory Visit: Payer: Self-pay | Admitting: Family Medicine

## 2022-01-27 DIAGNOSIS — J209 Acute bronchitis, unspecified: Secondary | ICD-10-CM

## 2022-01-27 DIAGNOSIS — J441 Chronic obstructive pulmonary disease with (acute) exacerbation: Secondary | ICD-10-CM

## 2022-01-27 NOTE — Telephone Encounter (Signed)
Requested Prescriptions  Pending Prescriptions Disp Refills  . albuterol (VENTOLIN HFA) 108 (90 Base) MCG/ACT inhaler [Pharmacy Med Name: ALBUTEROL HFA (VENTOLIN) INH] 18 each 0    Sig: INHALE 2 PUFFS INTO LUNGS EVERY 6 HOURS AS NEEDED FOR WHEEZE/SHORTNESS OF BREATH     Pulmonology:  Beta Agonists 2 Passed - 01/27/2022 12:07 AM      Passed - Last BP in normal range    BP Readings from Last 1 Encounters:  01/11/22 126/76         Passed - Last Heart Rate in normal range    Pulse Readings from Last 1 Encounters:  01/11/22 63         Passed - Valid encounter within last 12 months    Recent Outpatient Visits          3 months ago Lymphedema of left arm   [place] Denn[facility] Schaumann, Cammie Mcgee, MD   9 months ago Neuropathy   [place] Dennard Schaumann, Cammie Mcgee, MD   1 year ago Adult general medical exam   [place], [place], [place]   3 years ago Acute recurrent pansinusitis   [place] Delsa Grana, PA-C   3 years ago Lymphedema of left arm   [place] Pickard, Cammie Mcgee, MD

## 2022-01-28 ENCOUNTER — Other Ambulatory Visit: Payer: Self-pay

## 2022-01-28 ENCOUNTER — Other Ambulatory Visit: Payer: Self-pay | Admitting: Family Medicine

## 2022-01-28 ENCOUNTER — Telehealth: Payer: Self-pay

## 2022-01-28 MED ORDER — HYDROCODONE-ACETAMINOPHEN 7.5-325 MG PO TABS: 1.0000 | ORAL_TABLET | Freq: Four times a day (QID) | ORAL | 0 refills | Status: DC | PRN

## 2022-01-28 NOTE — Telephone Encounter (Signed)
LOV 01/11/22 Last refill 12/31/21, #30, 0 refills  Please review, thanks!

## 2022-01-28 NOTE — Telephone Encounter (Signed)
Pt called in to request for HYDROcodone-acetaminophen (NORCO) 7.5-325 MG tablet  [facility] [address] Pt has put in several request for this med and wanting to know why it has not been filled yet. Pt asks for a cb from clinical staff please.  Cb#: [phone]

## 2022-02-17 ENCOUNTER — Other Ambulatory Visit: Payer: Self-pay | Admitting: Family Medicine

## 2022-02-17 MED ORDER — HYDROCODONE-ACETAMINOPHEN 7.5-325 MG PO TABS: 1.0000 | ORAL_TABLET | Freq: Four times a day (QID) | ORAL | 0 refills | Status: DC | PRN

## 2022-02-17 NOTE — Telephone Encounter (Signed)
Called pt re her pain meds. LM for pt's daughter for return call

## 2022-02-18 ENCOUNTER — Other Ambulatory Visit: Payer: Self-pay | Admitting: Family Medicine

## 2022-02-18 NOTE — Telephone Encounter (Signed)
Requested Prescriptions  Pending Prescriptions Disp Refills  . gabapentin (NEURONTIN) 300 MG capsule [Pharmacy Med Name: GABAPENTIN 300 MG CAPSULE] 30 capsule 0    Sig: TAKE 1 CAPSULE BY MOUTH EVERYDAY AT BEDTIME     Neurology: Anticonvulsants - gabapentin Failed - 02/18/2022 11:38 AM      Failed - Cr in normal range and within 360 days    Creatinine  Date Value Ref Range Status  12/23/2016 0.7 0.6 - 1.1 mg/dL Final   Creat  Date Value Ref Range Status  10/12/2021 0.53 (L) 0.60 - 0.95 mg/dL Final         Passed - Completed PHQ-2 or PHQ-9 in the last 360 days      Passed - Valid encounter within last 12 months    Recent Outpatient Visits          4 months ago Lymphedema of left arm   [place] [facility]enn[facility] Schaumann, Cammie Mcgee, MD   10 months ago Neuropathy   [place] Denn[facility] Schaumann, Cammie Mcgee, MD   2 years ago Adult general medical exam   [place], [place], [place]   3 years ago Acute recurrent pansinusitis   [place] Delsa Grana, PA-C   3 years ago Lymphedema of left arm   [place] Pickard, Cammie Mcgee, MD

## 2022-03-18 ENCOUNTER — Other Ambulatory Visit: Payer: Self-pay | Admitting: Family Medicine

## 2022-03-18 ENCOUNTER — Telehealth: Payer: Self-pay | Admitting: Family Medicine

## 2022-03-18 MED ORDER — HYDROCODONE-ACETAMINOPHEN 7.5-325 MG PO TABS: 1.0000 | ORAL_TABLET | Freq: Four times a day (QID) | ORAL | 0 refills | Status: DC | PRN

## 2022-03-18 MED ORDER — GABAPENTIN 300 MG PO CAPS: ORAL_CAPSULE | ORAL | 5 refills | Status: DC

## 2022-03-18 NOTE — Telephone Encounter (Signed)
Patient called to request refills for  gabapentin (NEURONTIN) 300 MG capsule [[id number]]   HYDROcodone-acetaminophen ([place]) 7.5-325 MG tablet [[id number]]   [facility]  [id number] [address], [id number] Phone:  [phone] Fax:  [phone] DEA #:  [id number] Please advise at [phone] prescriptions called in.

## 2022-04-20 ENCOUNTER — Telehealth: Payer: Self-pay

## 2022-04-20 NOTE — Telephone Encounter (Signed)
Pt called in to request refills of  gabapentin (NEURONTIN) 300 MG capsule [[id number]]    Order Details Dose, Route, Frequency: As Directed  Dispense Quantity: 30 capsule Refills: 5   Note to Pharmacy: Please schedule appt for further refills       Sig: TAKE 1 CAPSULE BY MOUTH EVERYDAY AT BEDTIME       Start Date: 03/18/22 End Date: --  Written Date: 03/18/22 Expiration Date: 03/18/23   HYDROcodone-acetaminophen (NORCO) 7.5-325 MG tablet [[id number]]    Order Details Dose: 1 tablet Route: Oral Frequency: Every 6 hours PRN for moderate pain  Dispense Quantity: 30 tablet Refills: 0        Sig: Take 1 tablet by mouth every 6 (six) hours as needed for moderate pain.    LOV: 01/11/22  [id number] [place] [place] [address]

## 2022-04-22 ENCOUNTER — Other Ambulatory Visit: Payer: Self-pay | Admitting: Family Medicine

## 2022-04-22 MED ORDER — GABAPENTIN 300 MG PO CAPS: ORAL_CAPSULE | ORAL | 5 refills | Status: DC

## 2022-04-22 NOTE — Telephone Encounter (Signed)
Requested Prescriptions  Pending Prescriptions Disp Refills  . omeprazole (PRILOSEC) 20 MG capsule [Pharmacy Med Name: OMEPRAZOLE DR 20 MG CAPSULE] 90 capsule 3    Sig: TAKE 1 CAPSULE BY MOUTH EVERY DAY     Gastroenterology: Proton Pump Inhibitors Passed - 04/22/2022  2:14 AM      Passed - Valid encounter within last 12 months    Recent Outpatient Visits          6 months ago Lymphedema of left arm   [place] Pickard, Cammie Mcgee, MD   1 year ago Neuropathy   [place] Susy Frizzle, MD   2 years ago Adult general medical exam   [place], [place], [place]   3 years ago Acute recurrent pansinusitis   [place] Delsa Grana, PA-C   3 years ago Lymphedema of left arm   [place] Pickard, Cammie Mcgee, MD

## 2022-04-28 NOTE — Telephone Encounter (Signed)
Patient called to f/u on refill request for   HYDROcodone-acetaminophen ([place]) 7.5-325 MG tablet [[id number]]  Patient only has 1 pill left.  Pt. Also needs refill of    albuterol (PROVENTIL) (2.5 MG/3ML) 0.083% nebulizer solution [[id number]]   LOV: 01/11/2022  Ph[facility] as:   [id number] [place] [address] [id number] Phone:  [phone] Fax:  [phone] DEA #:  [id number] Please advise at [phone]

## 2022-05-02 ENCOUNTER — Other Ambulatory Visit: Payer: Self-pay | Admitting: Family Medicine

## 2022-05-02 ENCOUNTER — Other Ambulatory Visit: Payer: Self-pay

## 2022-05-02 MED ORDER — HYDROCODONE-ACETAMINOPHEN 7.5-325 MG PO TABS
1.0000 | ORAL_TABLET | Freq: Four times a day (QID) | ORAL | 0 refills | Status: DC | PRN
Start: 2022-05-02 — End: 2022-06-06

## 2022-05-18 ENCOUNTER — Telehealth: Payer: Self-pay | Admitting: Family Medicine

## 2022-05-18 NOTE — Telephone Encounter (Signed)
Left message to return call. Need to notify patient of time and provider change for upcoming [facility] appt on 06/16/22.

## 2022-05-27 ENCOUNTER — Other Ambulatory Visit: Payer: Self-pay | Admitting: Family Medicine

## 2022-05-27 DIAGNOSIS — J209 Acute bronchitis, unspecified: Secondary | ICD-10-CM

## 2022-05-27 DIAGNOSIS — J441 Chronic obstructive pulmonary disease with (acute) exacerbation: Secondary | ICD-10-CM

## 2022-05-27 NOTE — Telephone Encounter (Signed)
Requested Prescriptions  Pending Prescriptions Disp Refills  . albuterol (VENTOLIN HFA) 108 (90 Base) MCG/ACT inhaler [Pharmacy Med Name: ALBUTEROL HFA (VENTOLIN) INH] 18 each 0    Sig: INHALE 2 PUFFS INTO LUNGS EVERY 6 HOURS AS NEEDED FOR WHEEZE/SHORTNESS OF BREATH     Pulmonology:  Beta Agonists 2 Passed - 05/27/2022  3:28 PM      Passed - Last BP in normal range    BP Readings from Last 1 Encounters:  01/11/22 126/76         Passed - Last Heart Rate in normal range    Pulse Readings from Last 1 Encounters:  01/11/22 63         Passed - Valid encounter within last 12 months    Recent Outpatient Visits          7 months ago Lymphedema of left arm   [place] Pickard, Cammie Mcgee, MD   1 year ago Neuropathy   [place] Susy Frizzle, MD   2 years ago Adult general medical exam   [place], [place], [place]   3 years ago Acute recurrent pansinusitis   [place] Delsa Grana, PA-C   4 years ago Lymphedema of left arm   [place] Pickard, Cammie Mcgee, MD

## 2022-06-06 ENCOUNTER — Other Ambulatory Visit: Payer: Self-pay | Admitting: Family Medicine

## 2022-06-06 MED ORDER — HYDROCODONE-ACETAMINOPHEN 7.5-325 MG PO TABS: 1.0000 | ORAL_TABLET | Freq: Four times a day (QID) | ORAL | 0 refills | Status: DC | PRN

## 2022-06-06 NOTE — Telephone Encounter (Signed)
  Prescription Request  06/06/2022  Is this a "Controlled Substance" medicine? Yes  LOV: 04/20/2022   What is the name of the medication or equipment? HYDROcodone-acetaminophen (NORCO) 7.5-325 MG tablet [[id number]]   Have you contacted your pharmacy to request a refill? No   Which pharmacy would you like this sent to?  [id number] [place], [place] - [address]    Patient notified that their request is being sent to the clinical staff for review and that they should receive a response within 2 business days.   Please advise at [phone]

## 2022-06-16 ENCOUNTER — Encounter: Payer: Self-pay | Admitting: Family Medicine

## 2022-06-16 ENCOUNTER — Ambulatory Visit (INDEPENDENT_AMBULATORY_CARE_PROVIDER_SITE_OTHER): Payer: PPO | Admitting: Family Medicine

## 2022-06-16 DIAGNOSIS — Z0000 Encounter for general adult medical examination without abnormal findings: Secondary | ICD-10-CM

## 2022-06-16 NOTE — Progress Notes (Signed)
Subjective:   [name] is a 81 y.o. female who presents for Medicare Annual (Subsequent) preventive examination.  Review of Systems           Objective:    There were no vitals filed for this visit. There is no height or weight on file to calculate BMI.     06/10/2021    9:55 AM 02/27/2020    3:21 PM 04/16/2018    8:54 PM 02/23/2018   11:19 PM 02/20/2018    3:35 PM 12/23/2016   10:47 AM 12/24/2015   11:10 AM  Advanced Directives  Does Patient Have a Medical Advance Directive? No No No No No No No  Would patient like information on creating a medical advance directive? No - Patient declined No - Patient declined No - Patient declined Yes (Inpatient - patient defers creating a medical advance directive at this time) No - Patient declined  No - patient declined information    Current Medications (verified) Outpatient Encounter Medications as of 06/16/2022  Medication Sig   albuterol (PROVENTIL) (2.5 MG/3ML) 0.083% nebulizer solution USE 1 VIAL VIA NEBULIZER EVERY 6 HOURS AS NEEDED FOR WHEEZING OR FOR SHORTNESS OF BREATH   albuterol (VENTOLIN HFA) 108 (90 Base) MCG/ACT inhaler INHALE 2 PUFFS INTO LUNGS EVERY 6 HOURS AS NEEDED FOR WHEEZE/SHORTNESS OF BREATH   azithromycin (ZITHROMAX) 250 MG tablet 2 tabs poqday1, 1 tab poqday 2-5   escitalopram (LEXAPRO) 10 MG tablet TAKE 1 TABLET BY MOUTH EVERY DAY   famotidine (PEPCID) 20 MG tablet TAKE 1 TABLET (20 MG TOTAL) BY MOUTH 2 (TWO) TIMES DAILY AS NEEDED FOR HEARTBURN OR INDIGESTION.   fluticasone (FLONASE) 50 MCG/ACT nasal spray SPRAY 2 SPRAYS INTO EACH NOSTRIL EVERY DAY   gabapentin (NEURONTIN) 300 MG capsule TAKE 1 CAPSULE BY MOUTH EVERYDAY AT BEDTIME   HYDROcodone-acetaminophen (NORCO) 7.5-325 MG tablet Take 1 tablet by mouth every 6 (six) hours as needed for moderate pain.   levocetirizine (XYZAL) 5 MG tablet TAKE 1 TABLET BY MOUTH EVERY DAY IN THE EVENING   montelukast (SINGULAIR) 10 MG tablet TAKE 1 TABLET BY MOUTH EVERYDAY  AT BEDTIME   Multiple Vitamin (MULTIVITAMIN WITH MINERALS) TABS tablet Take 1 tablet by mouth at bedtime. CENTRUM FOR WOMEN 50+   omeprazole (PRILOSEC) 20 MG capsule TAKE 1 CAPSULE BY MOUTH EVERY DAY   RESTASIS 0.05 % ophthalmic emulsion 1 drop 2 (two) times daily.   vitamin B-12 (CYANOCOBALAMIN) 1000 MCG tablet Take 1,000 mcg by mouth at bedtime.   No facility-administered encounter medications on file as of 06/16/2022.    Allergies (verified) Penicillins   History: Past Medical History:  Diagnosis Date   Anemia    Arthritis    "hands, feet; shoulders; all over" (02/23/2018)   Asthma    "as a child; acts up a little sometimes" (02/23/2018)   Breast cancer, left breast ([facility]) 1967   Cancer of right breast ([facility]) 2003   Depression    Diverticulosis    GERD (gastroesophageal reflux disease)    History of blood transfusion 2003   "several; before my right breast OR"   History of hiatal hernia    Hypertension    after weight loss, pt came off medications and has not had it since   Lymphedema of left arm    Osteoporosis    Pneumonia    "once" (02/23/2018)   Vitamin D deficiency    Past Surgical History:  Procedure Laterality Date   ABDOMINAL HYSTERECTOMY  APPENDECTOMY     BASAL CELL CARCINOMA EXCISION Right    "top of my forehead"   BREAST BIOPSY Left 1967   BREAST BIOPSY Right 2003   CATARACT EXTRACTION W/ INTRAOCULAR LENS  IMPLANT, BILATERAL Bilateral    CHOLECYSTECTOMY OPEN     COLONOSCOPY     JOINT REPLACEMENT     MASTECTOMY Left 1967   MASTECTOMY Right 2003   REVERSE SHOULDER ARTHROPLASTY Left 02/23/2018   REVERSE SHOULDER ARTHROPLASTY Left 02/23/2018   Procedure: REVERSE LEFT SHOULDER ARTHROPLASTY;  Surgeon: Netta Cedars, MD;  Location: [facility];  Service: Orthopedics;  Laterality: Left;   TONSILLECTOMY     TOTAL KNEE ARTHROPLASTY Bilateral    Family History  Problem Relation Age of Onset   Asthma Mother    COPD Mother    Congestive Heart Failure Father     Heart disease Father    Breast cancer Sister 86   Social History   Socioeconomic History   Marital status: Divorced    Spouse name: Not on file   Number of children: 2   Years of education: Not on file   Highest education level: Not on file  Occupational History   Not on file  Tobacco Use   Smoking status: Former    Packs/day: 0.75    Years: 4.00    Total pack years: 3.00    Types: Cigarettes    Quit date: 47    Years since quitting: 65.9   Smokeless tobacco: Never  Vaping Use   Vaping Use: Never used  Substance and Sexual Activity   Alcohol use: Not Currently    Alcohol/week: 0.0 standard drinks of alcohol    Comment: 02/23/2018 "glass of wine 1-2 times/yr; if that"   Drug use: Never   Sexual activity: Not Currently  Other Topics Concern   Not on file  Social History Narrative   1 son and 1 daughter.   5 grandchildren   16 great grandchildren   Social Determinants of Health   Financial Resource Strain: Low Risk  (06/10/2021)   Overall Financial Resource Strain (CARDIA)    Difficulty of Paying Living Expenses: Not hard at all  Food Insecurity: No Food Insecurity (06/10/2021)   Hunger Vital Sign    Worried About Running Out of Food in the Last Year: Never true    Ran Out of Food in the Last Year: Never true  Transportation Needs: No Transportation Needs (06/10/2021)   PRAPARE - Hydrologist (Medical): No    Lack of Transportation (Non-Medical): No  Physical Activity: Insufficiently Active (06/10/2021)   Exercise Vital Sign    Days of Exercise per Week: 5 days    Minutes of Exercise per Session: 20 min  Stress: No Stress Concern Present (06/10/2021)   [place]    Feeling of Stress : Not at all  Social Connections: Moderately Integrated (06/10/2021)   Social Connection and Isolation Panel [NHANES]    Frequency of Communication with Friends and Family: More than  three times a week    Frequency of Social Gatherings with Friends and Family: More than three times a week    Attends Religious Services: More than 4 times per year    Active Member of [employer] or Organizations: Yes    Attends Music therapist: More than 4 times per year    Marital Status: Divorced    Tobacco Counseling Counseling given: Not Answered   Clinical Intake:  Diabetic?no         Activities of Daily Living     No data to display          Patient Care Team: Susy Frizzle, MD as PCP - General (Family Medicine)  Indicate any recent Medical Services you may have received from other than [facility] providers in the past year (date may be approximate).     Assessment:   This is a routine wellness examination for [place].  Hearing/Vision screen No results found.  Dietary issues and exercise activities discussed:     Goals Addressed   None    Depression Screen    01/11/2022   12:38 PM 06/10/2021    9:51 AM 04/13/2021    2:09 PM 02/17/2020    3:05 PM 10/05/2017    9:36 AM 05/04/2017    4:07 PM 11/01/2016    9:57 AM  PHQ 2/9 Scores  PHQ - 2 Score 0 0 0 0 0 0 0  PHQ- 9 Score       0    Fall Risk    01/11/2022   12:38 PM 06/10/2021    9:57 AM 04/13/2021    2:09 PM 10/05/2017    9:36 AM 05/04/2017    4:07 PM  Fall Risk   Falls in the past year? 0 0 0 No No  Number falls in past yr:  0 0    Injury with Fall?  0 0    Risk for fall due to :  Impaired vision;Impaired balance/gait     Follow up Falls evaluation completed Falls prevention discussed Falls evaluation completed      FALL RISK PREVENTION PERTAINING TO THE HOME:  Any stairs in or around the home? No  If so, are there any without handrails? No  Home free of loose throw rugs in walkways, pet beds, electrical cords, etc? No  Adequate lighting in your home to reduce risk of falls? Yes   ASSISTIVE DEVICES UTILIZED TO PREVENT FALLS:  Life alert? No  Use of a  cane, walker or w/c? Yes  Grab bars in the bathroom? Yes  Shower chair or bench in shower? No  Elevated toilet seat or a handicapped toilet? Yes   TIMED UP AND GO:  Was the test performed? No .  Length of time to ambulate 10 feet: 0 sec.   Gait steady and fast without use of assistive device  Cognitive Function:        06/10/2021   10:00 AM  6CIT Screen  What Year? 0 points  What month? 0 points  What time? 0 points  Count back from 20 0 points  Months in reverse 2 points  Repeat phrase 0 points  Total Score 2 points    Immunizations Immunization History  Administered Date(s) Administered   Influenza Whole 05/30/2007, 06/01/2010   Influenza,inj,Quad PF,6+ Mos 07/01/2014, 06/19/2015   Pneumococcal Conjugate-13 07/01/2014   Pneumococcal Polysaccharide-23 10/05/2017    TDAP status: Due, Education has been provided regarding the importance of this vaccine. Advised may receive this vaccine at local pharmacy or Health Dept. Aware to provide a copy of the vaccination record if obtained from local pharmacy or Health Dept. Verbalized acceptance and understanding.  Flu Vaccine status: Declined, Education has been provided regarding the importance of this vaccine but patient still declined. Advised may receive this vaccine at local pharmacy or Health Dept. Aware to provide a copy of the vaccination record if obtained from local pharmacy or Health Dept. Verbalized acceptance  and understanding.  Pneumococcal vaccine status: Declined,  Education has been provided regarding the importance of this vaccine but patient still declined. Advised may receive this vaccine at local pharmacy or Health Dept. Aware to provide a copy of the vaccination record if obtained from local pharmacy or Health Dept. Verbalized acceptance and understanding.   Covid-19 vaccine status: Declined, Education has been provided regarding the importance of this vaccine but patient still declined. Advised may receive  this vaccine at local pharmacy or Health Dept.or vaccine clinic. Aware to provide a copy of the vaccination record if obtained from local pharmacy or Health Dept. Verbalized acceptance and understanding.  Qualifies for Shingles Vaccine? Yes   Zostavax completed No   Shingrix Completed?: No.    Education has been provided regarding the importance of this vaccine. Patient has been advised to call insurance company to determine out of pocket expense if they have not yet received this vaccine. Advised may also receive vaccine at local pharmacy or Health Dept. Verbalized acceptance and understanding.  Screening Tests Health Maintenance  Topic Date Due   COVID-19 Vaccine (1) Never done   TETANUS/TDAP  Never done   Zoster Vaccines- Shingrix (1 of 2) Never done   INFLUENZA VACCINE  03/01/2022   Medicare Annual Wellness (AWV)  06/10/2022   Pneumonia Vaccine 47+ Years old  Completed   DEXA SCAN  Completed   HPV VACCINES  Aged Out    Health Maintenance  Health Maintenance Due  Topic Date Due   COVID-19 Vaccine (1) Never done   TETANUS/TDAP  Never done   Zoster Vaccines- Shingrix (1 of 2) Never done   INFLUENZA VACCINE  03/01/2022   Medicare Annual Wellness (AWV)  06/10/2022    Colorectal cancer screening: Type of screening: Colonoscopy. Completed 2003. Repeat every 10 years  Mammogram status: No longer required due to age, mastectomies.  Bone Density status: Completed 2009. Results reflect: Bone density results: NORMAL. Repeat every 0 years. UTD  Lung Cancer Screening: (Low Dose CT Chest recommended if Age 15-80 years, 30 pack-year currently smoking OR have quit w/in 15years.) does not qualify.   Lung Cancer Screening Referral: no  Additional Screening:  Hepatitis C Screening: does not qualify; Completed   Vision Screening: Recommended annual ophthalmology exams for early detection of glaucoma and other disorders of the eye. Is the patient up to date with their annual eye exam?  Yes   Who is the provider or what is the name of the office in which the patient attends annual eye exams? [facility] If pt is not established with a provider, would they like to be referred to a provider to establish care? No .   Dental Screening: Recommended annual dental exams for proper oral hygiene  Community Resource Referral / Chronic Care Management: CRR required this visit?  No   CCM required this visit?  No      Plan:     I have personally reviewed and noted the following in the patient's chart:   Medical and social history Use of alcohol, tobacco or illicit drugs  Current medications and supplements including opioid prescriptions. Patient is currently taking opioid prescriptions. Information provided to patient regarding non-opioid alternatives. Patient advised to discuss non-opioid treatment plan with their provider. Functional ability and status Nutritional status Physical activity Advanced directives List of other physicians Hospitalizations, surgeries, and ER visits in previous 12 months Vitals Screenings to include cognitive, depression, and falls Referrals and appointments  In addition, I have reviewed and discussed with patient  certain preventive protocols, quality metrics, and best practice recommendations. A written personalized care plan for preventive services as well as general preventive health recommendations were provided to patient.     Rubie Maid, [place]   06/16/2022   Nurse Notes:

## 2022-07-01 ENCOUNTER — Telehealth: Payer: Self-pay

## 2022-07-01 NOTE — Telephone Encounter (Signed)
Prescription Request  07/01/2022  Is this a "Controlled Substance" medicine? Yes  LOV: 06/16/22 What is the name of the medication or equipment? HYDROcodone-acetaminophen (NORCO) 7.5-325 MG tablet [[id number]]   Have you contacted your pharmacy to request a refill? No   Which pharmacy would you like this sent to?   [id number] [place], [place] - [address],    Patient notified that their request is being sent to the clinical staff for review and that they should receive a response within 2 business days.   Please advise at [place]

## 2022-07-04 ENCOUNTER — Other Ambulatory Visit: Payer: Self-pay | Admitting: Family Medicine

## 2022-07-04 MED ORDER — HYDROCODONE-ACETAMINOPHEN 7.5-325 MG PO TABS: 1.0000 | ORAL_TABLET | Freq: Four times a day (QID) | ORAL | 0 refills | Status: DC | PRN

## 2022-07-18 ENCOUNTER — Other Ambulatory Visit: Payer: Self-pay | Admitting: Family Medicine

## 2022-07-29 ENCOUNTER — Other Ambulatory Visit: Payer: Self-pay | Admitting: Family Medicine

## 2022-07-29 ENCOUNTER — Telehealth: Payer: Self-pay | Admitting: Family Medicine

## 2022-07-29 MED ORDER — HYDROCODONE-ACETAMINOPHEN 7.5-325 MG PO TABS: 1.0000 | ORAL_TABLET | Freq: Four times a day (QID) | ORAL | 0 refills | Status: DC | PRN

## 2022-07-29 NOTE — Telephone Encounter (Signed)
Prescription Request  07/29/2022  Is this a "Controlled Substance" medicine? Yes  LOV: Visit date not found  What is the name of the medication or equipment?   HYDROcodone-acetaminophen (NORCO) 7.5-325 MG tablet   albuterol (VENTOLIN HFA) 108 (90 Base) MCG/ACT inhaler   Have you contacted your pharmacy to request a refill? Yes   Which pharmacy would you like this sent to?  [id number] [place], [place] - [id number] [place]Phone: [phone]: [phone]   Patient notified that their request is being sent to the clinical staff for review and that they should receive a response within 2 business days.   Please advise at [phone]

## 2022-07-30 ENCOUNTER — Other Ambulatory Visit: Payer: Self-pay | Admitting: Family Medicine

## 2022-07-30 DIAGNOSIS — J441 Chronic obstructive pulmonary disease with (acute) exacerbation: Secondary | ICD-10-CM

## 2022-07-30 DIAGNOSIS — J209 Acute bronchitis, unspecified: Secondary | ICD-10-CM

## 2022-07-31 NOTE — Telephone Encounter (Signed)
Requested Prescriptions  Pending Prescriptions Disp Refills   albuterol (VENTOLIN HFA) 108 (90 Base) MCG/ACT inhaler [Pharmacy Med Name: ALBUTEROL HFA (VENTOLIN) INH] 18 each 0    Sig: INHALE 2 PUFFS INTO LUNGS EVERY 6 HOURS AS NEEDED FOR WHEEZE/SHORTNESS OF BREATH     Pulmonology:  Beta Agonists 2 Passed - 07/30/2022  2:47 PM      Passed - Last BP in normal range    BP Readings from Last 1 Encounters:  01/11/22 126/76         Passed - Last Heart Rate in normal range    Pulse Readings from Last 1 Encounters:  01/11/22 63         Passed - Valid encounter within last 12 months    Recent Outpatient Visits           9 months ago Lymphedema of left arm   [place] Pickard, Cammie Mcgee, MD   1 year ago Neuropathy   [place] Susy Frizzle, MD   2 years ago Adult general medical exam   [place], [place], [place]   3 years ago Acute recurrent pansinusitis   [place] Delsa Grana, PA-C   4 years ago Lymphedema of left arm   [place] Pickard, Cammie Mcgee, MD

## 2022-08-31 ENCOUNTER — Telehealth: Payer: Self-pay | Admitting: Family Medicine

## 2022-08-31 NOTE — Telephone Encounter (Signed)
Prescription Request  08/31/2022  Is this a "Controlled Substance" medicine? Yes  LOV: 01/11/2022  What is the name of the medication or equipment?   HYDROcodone-acetaminophen (NORCO) 7.5-325 MG tablet   fluticasone (FLONASE) 50 MCG/ACT nasal spray [[id number]]   Have you contacted your pharmacy to request a refill? Yes   Which pharmacy would you like this sent to?  [id number] [place], [place] - [id number] [id number]: [phone]: [phone]   Patient notified that their request is being sent to the clinical staff for review and that they should receive a response within 2 business days.   Please advise patient at [phone](mobile).

## 2022-09-01 ENCOUNTER — Other Ambulatory Visit: Payer: Self-pay

## 2022-09-01 DIAGNOSIS — J320 Chronic maxillary sinusitis: Secondary | ICD-10-CM

## 2022-09-01 MED ORDER — FLUTICASONE PROPIONATE 50 MCG/ACT NA SUSP: NASAL | 2 refills | Status: DC

## 2022-09-06 ENCOUNTER — Other Ambulatory Visit: Payer: Self-pay | Admitting: Family Medicine

## 2022-09-06 DIAGNOSIS — J441 Chronic obstructive pulmonary disease with (acute) exacerbation: Secondary | ICD-10-CM

## 2022-09-06 DIAGNOSIS — J209 Acute bronchitis, unspecified: Secondary | ICD-10-CM

## 2022-09-06 MED ORDER — HYDROCODONE-ACETAMINOPHEN 7.5-325 MG PO TABS: 1.0000 | ORAL_TABLET | Freq: Four times a day (QID) | ORAL | 0 refills | Status: DC | PRN

## 2022-09-06 NOTE — Telephone Encounter (Signed)
Patient called to follow up on refill request; patient took last pill on Sunday.  Please advise at [phone].

## 2022-09-07 NOTE — Telephone Encounter (Signed)
Requested medication (s) are due for refill today: yes  Requested medication (s) are on the active medication list: yes  Last refill:  07/31/22  Future visit scheduled: yes  Notes to clinic:  Unable to refill per protocol, DX Code Needed.      Requested Prescriptions  Pending Prescriptions Disp Refills   albuterol (VENTOLIN HFA) 108 (90 Base) MCG/ACT inhaler [Pharmacy Med Name: ALBUTEROL HFA (VENTOLIN) INH] 18 each 0    Sig: INHALE 2 PUFFS INTO LUNGS EVERY 6 HOURS AS NEEDED FOR WHEEZE/SHORTNESS OF BREATH     Pulmonology:  Beta Agonists 2 Passed - 09/06/2022  9:37 AM      Passed - Last BP in normal range    BP Readings from Last 1 Encounters:  01/11/22 126/76         Passed - Last Heart Rate in normal range    Pulse Readings from Last 1 Encounters:  01/11/22 63         Passed - Valid encounter within last 12 months    Recent Outpatient Visits           11 months ago Lymphedema of left arm   [place] Pickard, Cammie Mcgee, MD   1 year ago Neuropathy   [place] Susy Frizzle, MD   2 years ago Adult general medical exam   [place], [place], [place]   3 years ago Acute recurrent pansinusitis   [place] Delsa Grana, PA-C   4 years ago Lymphedema of left arm   [place] Pickard, Cammie Mcgee, MD

## 2022-09-29 ENCOUNTER — Other Ambulatory Visit: Payer: Self-pay | Admitting: Family Medicine

## 2022-09-29 DIAGNOSIS — J441 Chronic obstructive pulmonary disease with (acute) exacerbation: Secondary | ICD-10-CM

## 2022-09-29 DIAGNOSIS — J209 Acute bronchitis, unspecified: Secondary | ICD-10-CM

## 2022-09-29 NOTE — Telephone Encounter (Signed)
Requested medication (s) are due for refill today - provider review   Requested medication (s) are on the active medication list -yes  Future visit scheduled -no  Last refill: 09/07/22 18 each  Notes to clinic: last RF has notes- sent for review   Requested Prescriptions  Pending Prescriptions Disp Refills   albuterol (VENTOLIN HFA) 108 (90 Base) MCG/ACT inhaler [Pharmacy Med Name: ALBUTEROL HFA (VENTOLIN) INH] 18 each 0    Sig: INHALE 2 PUFFS INTO LUNGS EVERY 6 HOURS AS NEEDED FOR WHEEZE/SHORTNESS OF BREATH     Pulmonology:  Beta Agonists 2 Passed - 09/29/2022 12:05 AM      Passed - Last BP in normal range    BP Readings from Last 1 Encounters:  01/11/22 126/76         Passed - Last Heart Rate in normal range    Pulse Readings from Last 1 Encounters:  01/11/22 63         Passed - Valid encounter within last 12 months    Recent Outpatient Visits           11 months ago Lymphedema of left arm   [place] Pickard, Cammie Mcgee, MD   1 year ago Neuropathy   [place] Schaumann, Cammie Mcgee, MD   2 years ago Adult general medical exam   [place], [place], [place]   4 years ago Acute recurrent pansinusitis   [place] Delsa Grana, PA-C   4 years ago Lymphedema of left arm   [place] Pickard, Cammie Mcgee, MD                 Requested Prescriptions  Pending Prescriptions Disp Refills   albuterol (VENTOLIN HFA) 108 (90 Base) MCG/ACT inhaler [Pharmacy Med Name: ALBUTEROL HFA (VENTOLIN) INH] 18 each 0    Sig: INHALE 2 PUFFS INTO LUNGS EVERY 6 HOURS AS NEEDED FOR WHEEZE/SHORTNESS OF BREATH     Pulmonology:  Beta Agonists 2 Passed - 09/29/2022 12:05 AM      Passed - Last BP in normal range    BP Readings from Last 1 Encounters:  01/11/22 126/76         Passed - Last Heart Rate in normal range    Pulse Readings from Last 1 Encounters:  01/11/22 63         Passed - Valid  encounter within last 12 months    Recent Outpatient Visits           11 months ago Lymphedema of left arm   [place] Schaumann, Cammie Mcgee, MD   1 year ago Neuropathy   [place] Susy Frizzle, MD   2 years ago Adult general medical exam   [place], [place], [place]   4 years ago Acute recurrent pansinusitis   [place] Delsa Grana, PA-C   4 years ago Lymphedema of left arm   [place] Pickard, Cammie Mcgee, MD

## 2022-10-05 ENCOUNTER — Telehealth: Payer: Self-pay

## 2022-10-05 NOTE — Telephone Encounter (Signed)
Prescription Request  10/05/2022  LOV: 01/11/22  What is the name of the medication or equipment? HYDROcodone-acetaminophen (NORCO) 7.5-325 MG tablet [id number]  Have you contacted your pharmacy to request a refill? Yes   Which pharmacy would you like this sent to?  [id number] [place], [place] - [id number] [id number]: [phone]: [phone]   Patient notified that their request is being sent to the clinical staff for review and that they should receive a response within 2 business days.   Please advise at [place]

## 2022-10-06 ENCOUNTER — Other Ambulatory Visit: Payer: Self-pay | Admitting: Family Medicine

## 2022-10-06 MED ORDER — HYDROCODONE-ACETAMINOPHEN 7.5-325 MG PO TABS: 1.0000 | ORAL_TABLET | Freq: Four times a day (QID) | ORAL | 0 refills | Status: DC | PRN

## 2022-10-14 ENCOUNTER — Other Ambulatory Visit: Payer: Self-pay | Admitting: Family Medicine

## 2022-10-18 ENCOUNTER — Other Ambulatory Visit: Payer: Self-pay | Admitting: Family Medicine

## 2022-10-18 DIAGNOSIS — J441 Chronic obstructive pulmonary disease with (acute) exacerbation: Secondary | ICD-10-CM

## 2022-10-18 DIAGNOSIS — J209 Acute bronchitis, unspecified: Secondary | ICD-10-CM

## 2022-11-02 ENCOUNTER — Telehealth: Payer: Self-pay

## 2022-11-02 NOTE — Telephone Encounter (Signed)
Prescription Request  11/02/2022  LOV: 06/16/22  What is the name of the medication or equipment? HYDROcodone-acetaminophen (NORCO) 7.5-325 MG tablet  Have you contacted your pharmacy to request a refill? No   Which pharmacy would you like this sent to?  [id number] [place], [place] - [id number], [place] [address]     Patient notified that their request is being sent to the clinical staff for review and that they should receive a response within 2 business days.   Please advise at [place]

## 2022-11-03 ENCOUNTER — Other Ambulatory Visit: Payer: Self-pay | Admitting: Family Medicine

## 2022-11-03 MED ORDER — HYDROCODONE-ACETAMINOPHEN 7.5-325 MG PO TABS: 1.0000 | ORAL_TABLET | Freq: Four times a day (QID) | ORAL | 0 refills | Status: DC | PRN

## 2022-11-28 ENCOUNTER — Other Ambulatory Visit: Payer: Self-pay

## 2022-11-28 ENCOUNTER — Other Ambulatory Visit: Payer: Self-pay | Admitting: Family Medicine

## 2022-11-28 MED ORDER — HYDROCODONE-ACETAMINOPHEN 7.5-325 MG PO TABS: 1.0000 | ORAL_TABLET | Freq: Four times a day (QID) | ORAL | 0 refills | Status: DC | PRN

## 2022-11-28 NOTE — Addendum Note (Signed)
Addended by: Arta Silence on: 11/28/2022 12:30 PM   Modules accepted: Orders

## 2022-12-23 ENCOUNTER — Telehealth: Payer: Self-pay

## 2022-12-23 ENCOUNTER — Other Ambulatory Visit: Payer: Self-pay | Admitting: Family Medicine

## 2022-12-23 MED ORDER — HYDROCODONE-ACETAMINOPHEN 7.5-325 MG PO TABS: 1.0000 | ORAL_TABLET | Freq: Four times a day (QID) | ORAL | 0 refills | Status: DC | PRN

## 2022-12-23 NOTE — Telephone Encounter (Signed)
Pt called in asking to get a refill of this med HYDROcodone-acetaminophen (NORCO) 7.5-325 MG tablet.  LOV: 01/11/22  [id number] [place], [place] - [address] [facility] [address], [place] [address] Phone: [phone]  Fax: [phone]   CB: [phone]

## 2023-01-10 ENCOUNTER — Other Ambulatory Visit: Payer: Self-pay | Admitting: Family Medicine

## 2023-01-11 NOTE — Telephone Encounter (Signed)
Requested Prescriptions  Pending Prescriptions Disp Refills   omeprazole (PRILOSEC) 20 MG capsule [Pharmacy Med Name: OMEPRAZOLE DR 20 MG CAPSULE] 90 capsule 0    Sig: TAKE 1 CAPSULE BY MOUTH EVERY DAY     Gastroenterology: Proton Pump Inhibitors Failed - 01/10/2023  2:51 AM      Failed - Valid encounter within last 12 months    Recent Outpatient Visits           1 year ago Lymphedema of left arm   [facility] Pickard, Priscille Heidelberg, MD   1 year ago Neuropathy   [place] [facility] Donita Brooks, MD   2 years ago Adult general medical exam   [facility] Elmore Guise, FNP   4 years ago Acute recurrent pansinusitis   [facility] Danelle Berry, PA-C   4 years ago Lymphedema of left arm   [facility] Pickard, Priscille Heidelberg, MD

## 2023-01-23 ENCOUNTER — Telehealth: Payer: Self-pay

## 2023-01-23 NOTE — Telephone Encounter (Signed)
Prescription Request  01/23/2023  LOV: 01/11/22  What is the name of the medication or equipment? HYDROcodone-acetaminophen (NORCO) 7.5-325 MG tablet [[id number]]   Have you contacted your pharmacy to request a refill? No   Which pharmacy would you like this sent to?  [id number] [place], [place] - [address] AT [facility] [address] [place] [address] Phone: [phone]  Fax: [phone]    Patient notified that their request is being sent to the clinical staff for review and that they should receive a response within 2 business days.   Please advise at [facility] [phone]

## 2023-01-24 ENCOUNTER — Other Ambulatory Visit: Payer: Self-pay | Admitting: Family Medicine

## 2023-01-24 MED ORDER — HYDROCODONE-ACETAMINOPHEN 7.5-325 MG PO TABS: 1.0000 | ORAL_TABLET | Freq: Four times a day (QID) | ORAL | 0 refills | Status: DC | PRN

## 2023-02-16 ENCOUNTER — Other Ambulatory Visit: Payer: Self-pay | Admitting: Family Medicine

## 2023-02-16 ENCOUNTER — Telehealth: Payer: Self-pay | Admitting: Family Medicine

## 2023-02-16 MED ORDER — HYDROCODONE-ACETAMINOPHEN 7.5-325 MG PO TABS: 1.0000 | ORAL_TABLET | Freq: Four times a day (QID) | ORAL | 0 refills | Status: DC | PRN

## 2023-02-16 NOTE — Telephone Encounter (Signed)
Prescription Request  02/16/2023  LOV: Visit date not found  What is the name of the medication or equipment?   HYDROcodone-acetaminophen (NORCO) 7.5-325 MG tablet  **Patient took her last pill yesterday**  Have you contacted your pharmacy to request a refill? Yes   Which pharmacy would you like this sent to?  [id number] [place], [place] - [address] AT [facility] [address]  [place] [address] Phone: [phone] Fax: [phone]    Patient notified that their request is being sent to the clinical staff for review and that they should receive a response within 2 business days.   Please advise patient when refill sent in at [phone] or [phone].

## 2023-02-17 ENCOUNTER — Other Ambulatory Visit: Payer: Self-pay | Admitting: Family Medicine

## 2023-02-17 ENCOUNTER — Telehealth: Payer: Self-pay

## 2023-02-17 NOTE — Telephone Encounter (Signed)
Pt called stating that [facility] not have her pain meds, hydrocodone-Acetaminophen.  Is there an alternative per pt? [facility]  Pls advice

## 2023-02-17 NOTE — Telephone Encounter (Signed)
Requested Prescriptions  Refused Prescriptions Disp Refills   omeprazole (PRILOSEC) 20 MG capsule [Pharmacy Med Name: OMEPRAZOLE DR 20 MG CAPSULE] 90 capsule 0    Sig: TAKE 1 CAPSULE BY MOUTH EVERY DAY     Gastroenterology: Proton Pump Inhibitors Failed - 02/17/2023 11:32 AM      Failed - Valid encounter within last 12 months    Recent Outpatient Visits           1 year ago Lymphedema of left arm   [facility] Pickard, Priscille Heidelberg, MD   1 year ago Neuropathy   [facility] Tanya Nones, Priscille Heidelberg, MD   3 years ago Adult general medical exam   [facility] Medicine Elmore Guise, FNP   4 years ago Acute recurrent pansinusitis   [facility] Medicine Danelle Berry, PA-C   4 years ago Lymphedema of left arm   [facility] Medicine Pickard, Priscille Heidelberg, MD

## 2023-02-21 ENCOUNTER — Ambulatory Visit: Payer: PPO | Admitting: Family Medicine

## 2023-02-23 ENCOUNTER — Other Ambulatory Visit: Payer: Self-pay | Admitting: Family Medicine

## 2023-02-23 MED ORDER — OXYCODONE-ACETAMINOPHEN 7.5-325 MG PO TABS: 1.0000 | ORAL_TABLET | ORAL | 0 refills | Status: DC | PRN

## 2023-02-23 NOTE — Telephone Encounter (Signed)
Called pt this morning re: medication/per DPR LVM for pt that alternative medication request has been sent to her pharmacy per pcp

## 2023-03-07 ENCOUNTER — Telehealth: Payer: Self-pay

## 2023-03-07 NOTE — Telephone Encounter (Signed)
Reached out to patient to set up follow up visit with provider to discuss chronic conditions.  Telephone encounter attempt : 1   A HIPAA compliant voice message was left requesting a return call.  Instructed patient to call office or to call me at [phone].  Elijio [facility] Health Specialist

## 2023-03-24 ENCOUNTER — Telehealth: Payer: Self-pay | Admitting: Family Medicine

## 2023-03-24 ENCOUNTER — Other Ambulatory Visit: Payer: Self-pay | Admitting: Family Medicine

## 2023-03-24 MED ORDER — GABAPENTIN 300 MG PO CAPS: ORAL_CAPSULE | ORAL | 5 refills | Status: DC

## 2023-03-24 MED ORDER — HYDROCODONE-ACETAMINOPHEN 7.5-325 MG PO TABS: 1.0000 | ORAL_TABLET | Freq: Four times a day (QID) | ORAL | 0 refills | Status: DC | PRN

## 2023-03-24 NOTE — Telephone Encounter (Signed)
Prescription Request  03/24/2023  LOV: 06/06/22, and 01/11/22  What is the name of the medication or equipment?   gabapentin (NEURONTIN) 300 MG capsule   HYDROcodone-acetaminophen (NORCO) 7.5-325 MG tablet [[id number]]   Have you contacted your pharmacy to request a refill? Yes   Which pharmacy would you like this sent to?  [id number] [place] [place] - [address] AT [facility] [address] [place] [address] Phone: [phone] Fax: [phone]    Patient notified that their request is being sent to the clinical staff for review and that they should receive a response within 2 business days.   Please advise patient at [phone].

## 2023-04-05 ENCOUNTER — Other Ambulatory Visit: Payer: Self-pay | Admitting: Family Medicine

## 2023-04-05 DIAGNOSIS — J209 Acute bronchitis, unspecified: Secondary | ICD-10-CM

## 2023-04-05 DIAGNOSIS — J441 Chronic obstructive pulmonary disease with (acute) exacerbation: Secondary | ICD-10-CM

## 2023-04-05 DIAGNOSIS — J320 Chronic maxillary sinusitis: Secondary | ICD-10-CM

## 2023-04-06 NOTE — Telephone Encounter (Signed)
Requested medication (s) are due for refill today: Yes  Requested medication (s) are on the active medication list: Yes  Last refill:    Future visit scheduled: No  Notes to clinic:  Pt. Has not been seen in over 1 year.    Requested Prescriptions  Pending Prescriptions Disp Refills   albuterol (VENTOLIN HFA) 108 (90 Base) MCG/ACT inhaler [Pharmacy Med Name: ALBUTEROL HFA (VENTOLIN) INH] 18 each 0    Sig: INHALE 2 PUFFS INTO LUNGS EVERY 6 HOURS AS NEEDED FOR WHEEZE/SHORTNESS OF BREATH     Pulmonology:  Beta Agonists 2 Failed - 04/05/2023  2:04 PM      Failed - Valid encounter within last 12 months    Recent Outpatient Visits           1 year ago Lymphedema of left arm   [facility] Pickard, Priscille Heidelberg, MD   1 year ago Neuropathy   [facility] Medicine Pickard, Priscille Heidelberg, MD   3 years ago Adult general medical exam   [facility] Medicine Elmore Guise, FNP   4 years ago Acute recurrent pansinusitis   [facility] Medicine Danelle Berry, PA-C   4 years ago Lymphedema of left arm   [facility] Medicine Pickard, Priscille Heidelberg, MD              Passed - Last BP in normal range    BP Readings from Last 1 Encounters:  01/11/22 126/76         Passed - Last Heart Rate in normal range    Pulse Readings from Last 1 Encounters:  01/11/22 63          fluticasone (FLONASE) 50 MCG/ACT nasal spray [Pharmacy Med Name: FLUTICASONE PROP 50 MCG SPRAY] 48 mL 2    Sig: SPRAY 2 SPRAY INTO EACH NOSTRIL EVERY DAY.     Ear, Nose, and Throat: Nasal Preparations - Corticosteroids Failed - 04/05/2023  2:04 PM      Failed - Valid encounter within last 12 months    Recent Outpatient Visits           1 year ago Lymphedema of left arm   [facility] Medicine Pickard, Priscille Heidelberg, MD   1 year ago Neuropathy   [facility] Medicine Tanya Nones, Priscille Heidelberg, MD   3 years ago Adult general medical exam   [facility] Medicine Elmore Guise, FNP   4 years ago Acute recurrent pansinusitis   [facility] Danelle Berry, PA-C   4 years ago Lymphedema of left arm   [facility] Medicine Pickard, Priscille Heidelberg, MD

## 2023-04-20 ENCOUNTER — Other Ambulatory Visit: Payer: Self-pay

## 2023-04-20 MED ORDER — HYDROCODONE-ACETAMINOPHEN 7.5-325 MG PO TABS: 1.0000 | ORAL_TABLET | Freq: Four times a day (QID) | ORAL | 0 refills | Status: DC | PRN

## 2023-05-18 ENCOUNTER — Other Ambulatory Visit: Payer: Self-pay | Admitting: Family Medicine

## 2023-05-18 ENCOUNTER — Other Ambulatory Visit: Payer: Self-pay

## 2023-05-18 DIAGNOSIS — J209 Acute bronchitis, unspecified: Secondary | ICD-10-CM

## 2023-05-18 DIAGNOSIS — J441 Chronic obstructive pulmonary disease with (acute) exacerbation: Secondary | ICD-10-CM

## 2023-05-18 MED ORDER — GABAPENTIN 300 MG PO CAPS: ORAL_CAPSULE | ORAL | 5 refills | Status: DC

## 2023-05-18 MED ORDER — HYDROCODONE-ACETAMINOPHEN 7.5-325 MG PO TABS: 1.0000 | ORAL_TABLET | Freq: Four times a day (QID) | ORAL | 0 refills | Status: DC | PRN

## 2023-05-18 NOTE — Telephone Encounter (Signed)
Requested medication (s) are due for refill today:yes  Requested medication (s) are on the active medication list:yes  Last refill:  04/12/23 18 each  Future visit scheduled:no  Notes to clinic:  last OV 01/11/22   Requested Prescriptions  Pending Prescriptions Disp Refills   albuterol (VENTOLIN HFA) 108 (90 Base) MCG/ACT inhaler [Pharmacy Med Name: ALBUTEROL HFA (VENTOLIN) INH] 18 each 0    Sig: INHALE 2 PUFFS INTO LUNGS EVERY 6 HOURS AS NEEDED FOR WHEEZE/SHORTNESS OF BREATH     Pulmonology:  Beta Agonists 2 Failed - 05/18/2023 12:05 AM      Failed - Valid encounter within last 12 months    Recent Outpatient Visits           1 year ago Lymphedema of left arm   [facility] Medicine Pickard, Priscille Heidelberg, MD   2 years ago Neuropathy   [facility] Medicine Pickard, Priscille Heidelberg, MD   3 years ago Adult general medical exam   [facility] Medicine Elmore Guise, FNP   4 years ago Acute recurrent pansinusitis   [facility] Medicine Danelle Berry, PA-C   4 years ago Lymphedema of left arm   [facility] Medicine Pickard, Priscille Heidelberg, MD              Passed - Last BP in normal range    BP Readings from Last 1 Encounters:  01/11/22 126/76         Passed - Last Heart Rate in normal range    Pulse Readings from Last 1 Encounters:  01/11/22 63

## 2023-06-02 ENCOUNTER — Other Ambulatory Visit: Payer: Self-pay | Admitting: Family Medicine

## 2023-06-02 DIAGNOSIS — J320 Chronic maxillary sinusitis: Secondary | ICD-10-CM

## 2023-06-02 NOTE — Telephone Encounter (Signed)
Rx was sent to pharmacy on 04/12/23 #35mL/2 RF  Requested Prescriptions  Signed Prescriptions Disp Refills   omeprazole (PRILOSEC) 20 MG capsule 30 capsule 0    Sig: TAKE 1 CAPSULE BY MOUTH EVERY DAY     Gastroenterology: Proton Pump Inhibitors Failed - 06/02/2023 11:01 AM      Failed - Valid encounter within last 12 months    Recent Outpatient Visits           1 year ago Lymphedema of left arm   [facility] Medicine Pickard, Priscille Heidelberg, MD   2 years ago Neuropathy   [facility] Medicine Tanya Nones, Priscille Heidelberg, MD   3 years ago Adult general medical exam   [facility] Medicine Elmore Guise, FNP   4 years ago Acute recurrent pansinusitis   [facility] Medicine Danelle Berry, PA-C   5 years ago Lymphedema of left arm   [facility] Medicine Pickard, Priscille Heidelberg, MD       Future Appointments             In 6 days Pickard, Priscille Heidelberg, MD [facility] Family Medicine, [facility]            Refused Prescriptions Disp Refills   fluticasone (FLONASE) 50 MCG/ACT nasal spray 48 mL 2    Sig: SPRAY 2 SPRAY INTO EACH NOSTRIL EVERY DAY.     Ear, Nose, and Throat: Nasal Preparations - Corticosteroids Failed - 06/02/2023  1:51 PM      Failed - Valid encounter within last 12 months    Recent Outpatient Visits           1 year ago Lymphedema of left arm   [facility] Medicine Pickard, Priscille Heidelberg, MD   2 years ago Neuropathy   [facility] Medicine Tanya Nones, Priscille Heidelberg, MD   3 years ago Adult general medical exam   [facility] Medicine Elmore Guise, FNP   4 years ago Acute recurrent pansinusitis   [facility] Medicine Danelle Berry, PA-C   5 years ago Lymphedema of left arm   [facility] Family Medicine Pickard, Priscille Heidelberg, MD       Future Appointments             In 6 days Pickard, Priscille Heidelberg, MD [facility] Family Medicine, [facility]

## 2023-06-02 NOTE — Addendum Note (Signed)
Addended by: Ross Ludwig on: 06/02/2023 01:51 PM   Modules accepted: Orders

## 2023-06-02 NOTE — Telephone Encounter (Signed)
Requested Prescriptions  Pending Prescriptions Disp Refills   omeprazole (PRILOSEC) 20 MG capsule [Pharmacy Med Name: OMEPRAZOLE DR 20 MG CAPSULE] 30 capsule 0    Sig: TAKE 1 CAPSULE BY MOUTH EVERY DAY     Gastroenterology: Proton Pump Inhibitors Failed - 06/02/2023 11:01 AM      Failed - Valid encounter within last 12 months    Recent Outpatient Visits           1 year ago Lymphedema of left arm   [facility] Medicine Pickard, Priscille Heidelberg, MD   2 years ago Neuropathy   [facility] Medicine Tanya Nones, Priscille Heidelberg, MD   3 years ago Adult general medical exam   [facility] Medicine Elmore Guise, FNP   4 years ago Acute recurrent pansinusitis   [facility] Medicine Danelle Berry, PA-C   5 years ago Lymphedema of left arm   [facility] Medicine Pickard, Priscille Heidelberg, MD       Future Appointments             In 6 days Pickard, Priscille Heidelberg, MD [facility], [facility]

## 2023-06-02 NOTE — Telephone Encounter (Signed)
Prescription Request  06/02/2023  LOV: 01/11/2022 **Med refill appt scheduled for 06/08/23**  What is the name of the medication or equipment?   omeprazole (PRILOSEC) 20 MG capsule [[id number]]   fluticasone (FLONASE) 50 MCG/ACT nasal spray   Have you contacted your pharmacy to request a refill? Yes   Which pharmacy would you like this sent to?    [id number] [place], [place] - [address] [place] [address] Phone: [phone] Fax: [phone]  Patient notified that their request is being sent to the clinical staff for review and that they should receive a response within 2 business days.   Please advise pharmacist.

## 2023-06-08 ENCOUNTER — Encounter: Payer: Self-pay | Admitting: Family Medicine

## 2023-06-08 ENCOUNTER — Ambulatory Visit (INDEPENDENT_AMBULATORY_CARE_PROVIDER_SITE_OTHER): Payer: PPO | Admitting: Family Medicine

## 2023-06-08 VITALS — BP 126/74 | HR 73 | Temp 97.5°F | Ht 66.5 in | Wt 178.0 lb

## 2023-06-08 DIAGNOSIS — I890 Lymphedema, not elsewhere classified: Secondary | ICD-10-CM | POA: Diagnosis not present

## 2023-06-08 DIAGNOSIS — M150 Primary generalized (osteo)arthritis: Secondary | ICD-10-CM

## 2023-06-08 MED ORDER — HYDROCODONE-ACETAMINOPHEN 7.5-325 MG PO TABS: 1.0000 | ORAL_TABLET | Freq: Four times a day (QID) | ORAL | 0 refills | Status: DC | PRN

## 2023-06-08 NOTE — Progress Notes (Signed)
Subjective:    Patient ID: [name], female    DOB: 1940/09/24, 82 y.o.   MRN: [id number]  HPI   Patient is a very sweet 82 year old Caucasian female who comes in today for a checkup.  She has a history of arthritis in her hands and in her shoulders and in her feet and knees.  She takes 1 hydrocodone every night to alleviate the pain so that she can go to sleep.  Specifically she states that the PIP and DIP joints which are dysmorphic ache and throb at night and keep her awake.  If she takes the hydrocodone she is able to go to sleep.  She also has chronic pain in her right shoulder that required shoulder replacement.  She also deals with pain in her feet and in her knees.  She shows no evidence of abuse or diversion.   Patient request to increase her pain medication.  She states that 1 pill a day typically is enough however occasionally she would like to take a second pill a day especially when it is raining or cold outside because this seems to exacerbate her arthritis.  She denies any falls.  She denies any confusion.  She denies any memory loss.  She is still extremely short and is able to manage her affairs.  She also uses gabapentin at night to help with neuropathy but she denies any dizziness or disequilibrium on the medication.  Otherwise she is doing well.  Recently she states that her asthma has become more difficult to control.  She is having to use her rescue inhaler twice a day for wheezing and coughing.  Allergies seem to be the biggest trigger for this. Past Medical History:  Diagnosis Date   Anemia    Arthritis    "hands, feet; shoulders; all over" (02/23/2018)   Asthma    "as a child; acts up a little sometimes" (02/23/2018)   Breast cancer, left breast (HCC) 1967   Cancer of right breast (HCC) 2003   Depression    Diverticulosis    GERD (gastroesophageal reflux disease)    History of blood transfusion 2003   "several; before my right breast OR"   History of hiatal  hernia    Hypertension    after weight loss, pt came off medications and has not had it since   Lymphedema of left arm    Osteoporosis    Pneumonia    "once" (02/23/2018)   Vitamin D deficiency    Past Surgical History:  Procedure Laterality Date   ABDOMINAL HYSTERECTOMY     APPENDECTOMY     BASAL CELL CARCINOMA EXCISION Right    "top of my forehead"   BREAST BIOPSY Left 1967   BREAST BIOPSY Right 2003   CATARACT EXTRACTION W/ INTRAOCULAR LENS  IMPLANT, BILATERAL Bilateral    CHOLECYSTECTOMY OPEN     COLONOSCOPY     JOINT REPLACEMENT     MASTECTOMY Left 1967   MASTECTOMY Right 2003   REVERSE SHOULDER ARTHROPLASTY Left 02/23/2018   REVERSE SHOULDER ARTHROPLASTY Left 02/23/2018   Procedure: REVERSE LEFT SHOULDER ARTHROPLASTY;  Surgeon: Beverely Low, MD;  Location: [facility] OR;  Service: Orthopedics;  Laterality: Left;   TONSILLECTOMY     TOTAL KNEE ARTHROPLASTY Bilateral    Current Outpatient Medications on File Prior to Visit  Medication Sig Dispense Refill   albuterol (PROVENTIL) (2.5 MG/3ML) 0.083% nebulizer solution USE 1 VIAL VIA NEBULIZER EVERY 6 HOURS AS NEEDED FOR WHEEZING OR FOR SHORTNESS OF  BREATH 75 mL 1   albuterol (VENTOLIN HFA) 108 (90 Base) MCG/ACT inhaler INHALE 2 PUFFS INTO LUNGS EVERY 6 HOURS AS NEEDED FOR WHEEZE/SHORTNESS OF BREATH 18 each 0   azithromycin (ZITHROMAX) 250 MG tablet 2 tabs poqday1, 1 tab poqday 2-5 6 tablet 0   escitalopram (LEXAPRO) 10 MG tablet TAKE 1 TABLET BY MOUTH EVERY DAY 90 tablet 2   famotidine (PEPCID) 20 MG tablet TAKE 1 TABLET (20 MG TOTAL) BY MOUTH 2 (TWO) TIMES DAILY AS NEEDED FOR HEARTBURN OR INDIGESTION. 180 tablet 1   fluticasone (FLONASE) 50 MCG/ACT nasal spray SPRAY 2 SPRAY INTO EACH NOSTRIL EVERY DAY. 48 mL 2   gabapentin (NEURONTIN) 300 MG capsule TAKE 1 CAPSULE BY MOUTH EVERYDAY AT BEDTIME 30 capsule 5   HYDROcodone-acetaminophen (NORCO) 7.5-325 MG tablet Take 1 tablet by mouth every 6 (six) hours as needed for moderate pain (pain  score 4-6). 30 tablet 0   levocetirizine (XYZAL) 5 MG tablet TAKE 1 TABLET BY MOUTH EVERY DAY IN THE EVENING 90 tablet 0   montelukast (SINGULAIR) 10 MG tablet TAKE 1 TABLET BY MOUTH EVERYDAY AT BEDTIME 90 tablet 1   Multiple Vitamin (MULTIVITAMIN WITH MINERALS) TABS tablet Take 1 tablet by mouth at bedtime. CENTRUM FOR WOMEN 50+     omeprazole (PRILOSEC) 20 MG capsule TAKE 1 CAPSULE BY MOUTH EVERY DAY 30 capsule 0   RESTASIS 0.05 % ophthalmic emulsion 1 drop 2 (two) times daily.     vitamin B-12 (CYANOCOBALAMIN) 1000 MCG tablet Take 1,000 mcg by mouth at bedtime.     No current facility-administered medications on file prior to visit.   Allergies  Allergen Reactions   Penicillins Hives, Rash and Other (See Comments)    PATIENT HAS HAD A PCN REACTION WITH IMMEDIATE RASH, FACIAL/TONGUE/THROAT SWELLING, SOB, OR LIGHTHEADEDNESS WITH HYPOTENSION:  #  #  YES  #  #  Has patient had a PCN reaction causing severe rash involving mucus membranes or skin necrosis: No Has patient had a PCN reaction that required hospitalization: No Has patient had a PCN reaction occurring within the last 10 years: No If all of the above answers are "NO", then may proceed with Cephalosporin use.    Social History   Socioeconomic History   Marital status: Divorced    Spouse name: Not on file   Number of children: 2   Years of education: Not on file   Highest education level: Not on file  Occupational History   Not on file  Tobacco Use   Smoking status: Former    Current packs/day: 0.00    Average packs/day: 0.8 packs/day for 4.0 years (3.0 ttl pk-yrs)    Types: Cigarettes    Start date: 35    Quit date: 9    Years since quitting: 66.8   Smokeless tobacco: Never  Vaping Use   Vaping status: Never Used  Substance and Sexual Activity   Alcohol use: Not Currently    Alcohol/week: 0.0 standard drinks of alcohol    Comment: 02/23/2018 "glass of wine 1-2 times/yr; if that"   Drug use: Never   Sexual  activity: Not Currently  Other Topics Concern   Not on file  Social History Narrative   1 son and 1 daughter.   5 grandchildren   15 great grandchildren   Social Determinants of Health   Financial Resource Strain: Low Risk  (06/16/2022)   Overall Financial Resource Strain (CARDIA)    Difficulty of Paying Living Expenses: Not hard at all  Food Insecurity: No Food Insecurity (06/16/2022)   Hunger Vital Sign    Worried About Running Out of Food in the Last Year: Never true    Ran Out of Food in the Last Year: Never true  Transportation Needs: No Transportation Needs (06/16/2022)   PRAPARE - Administrator, Civil Service (Medical): No    Lack of Transportation (Non-Medical): No  Physical Activity: Sufficiently Active (06/16/2022)   Exercise Vital Sign    Days of Exercise per Week: 7 days    Minutes of Exercise per Session: 30 min  Stress: No Stress Concern Present (06/16/2022)   [employer] of Occupational Health - Occupational Stress Questionnaire    Feeling of Stress : Not at all  Social Connections: Moderately Integrated (06/16/2022)   Social Connection and Isolation Panel [NHANES]    Frequency of Communication with Friends and Family: More than three times a week    Frequency of Social Gatherings with Friends and Family: More than three times a week    Attends Religious Services: More than 4 times per year    Active Member of [employer] or Organizations: Yes    Attends Banker Meetings: More than 4 times per year    Marital Status: Divorced  Intimate Partner Violence: Not At Risk (06/16/2022)   Humiliation, Afraid, Rape, and Kick questionnaire    Fear of Current or Ex-Partner: No    Emotionally Abused: No    Physically Abused: No    Sexually Abused: No      Review of Systems  All other systems reviewed and are negative.      Objective:   Physical Exam Constitutional:      General: She is not in acute distress.    Appearance: Normal  appearance. She is well-developed. She is obese. She is not ill-appearing or toxic-appearing.  HENT:     Nose: Mucosal edema present.     Right Sinus: Maxillary sinus tenderness and frontal sinus tenderness present.  Cardiovascular:     Rate and Rhythm: Normal rate and regular rhythm.     Heart sounds: Normal heart sounds. No murmur heard. Pulmonary:     Effort: Pulmonary effort is normal. No respiratory distress.     Breath sounds: Wheezing present. No rales.  Abdominal:     General: Bowel sounds are normal. There is no distension.     Palpations: Abdomen is soft.     Tenderness: There is no abdominal tenderness. There is no rebound.  Musculoskeletal:     Left shoulder: Tenderness present. Decreased range of motion. Decreased strength.  Neurological:     Mental Status: She is alert.          Assessment & Plan:  Primary osteoarthritis involving multiple joints - Plan: CBC with Differential/Platelet, COMPLETE METABOLIC PANEL WITH GFR  Lymphedema of left arm I will try the patient on breztri samples 2 puffs twice daily.  Patient can try this for 2 weeks.  If beneficial I would switch the patient to a prescription of Advair as a preventative for her asthma.  However I want the patient to try the medication before spending the money on the long-term preventative.  I recommended a flu shot, an RSV vaccine, and a COVID shot.  She politely declines them.  I agree to increase her oxycodone to 45 tablets/month.  This would allow her 1 tablet a day and then an extra tablet on occasions when the pain is severe.  Cautioned the patient about memory loss and  dizziness and falls.  Obtain CBC and CMP today to monitor her electrolytes

## 2023-06-09 LAB — CBC WITH DIFFERENTIAL/PLATELET
Absolute Lymphocytes: 1185 {cells}/uL (ref 850–3900)
Absolute Monocytes: 420 {cells}/uL (ref 200–950)
Basophils Absolute: 38 {cells}/uL (ref 0–200)
Basophils Relative: 0.5 %
Eosinophils Absolute: 143 {cells}/uL (ref 15–500)
Eosinophils Relative: 1.9 %
HCT: 43.6 % (ref 35.0–45.0)
Hemoglobin: 14.6 g/dL (ref 11.7–15.5)
MCH: 32.3 pg (ref 27.0–33.0)
MCHC: 33.5 g/dL (ref 32.0–36.0)
MCV: 96.5 fL (ref 80.0–100.0)
MPV: 9.5 fL (ref 7.5–12.5)
Monocytes Relative: 5.6 %
Neutro Abs: 5715 {cells}/uL (ref 1500–7800)
Neutrophils Relative %: 76.2 %
Platelets: 250 Thousand/uL (ref 140–400)
RBC: 4.52 Million/uL (ref 3.80–5.10)
RDW: 11.3 % (ref 11.0–15.0)
Total Lymphocyte: 15.8 %
WBC: 7.5 Thousand/uL (ref 3.8–10.8)

## 2023-06-09 LAB — COMPLETE METABOLIC PANEL WITHOUT GFR
AG Ratio: 1.6 (calc) (ref 1.0–2.5)
ALT: 10 U/L (ref 6–29)
AST: 19 U/L (ref 10–35)
Albumin: 3.7 g/dL (ref 3.6–5.1)
Alkaline phosphatase (APISO): 69 U/L (ref 37–153)
BUN/Creatinine Ratio: 13 (calc) (ref 6–22)
BUN: 7 mg/dL (ref 7–25)
CO2: 30 mmol/L (ref 20–32)
Calcium: 9.1 mg/dL (ref 8.6–10.4)
Chloride: 101 mmol/L (ref 98–110)
Creat: 0.55 mg/dL — ABNORMAL LOW (ref 0.60–0.95)
Globulin: 2.3 g/dL (ref 1.9–3.7)
Glucose, Bld: 100 mg/dL — ABNORMAL HIGH (ref 65–99)
Potassium: 4.7 mmol/L (ref 3.5–5.3)
Sodium: 140 mmol/L (ref 135–146)
Total Bilirubin: 0.5 mg/dL (ref 0.2–1.2)
Total Protein: 6 g/dL — ABNORMAL LOW (ref 6.1–8.1)
eGFR: 90 mL/min/1.73m2

## 2023-06-19 ENCOUNTER — Other Ambulatory Visit: Payer: Self-pay

## 2023-06-19 ENCOUNTER — Telehealth: Payer: Self-pay

## 2023-06-19 DIAGNOSIS — J45909 Unspecified asthma, uncomplicated: Secondary | ICD-10-CM

## 2023-06-19 MED ORDER — BREZTRI AEROSPHERE 160-9-4.8 MCG/ACT IN AERO
2.0000 | INHALATION_SPRAY | Freq: Two times a day (BID) | RESPIRATORY_TRACT | 11 refills | Status: DC
Start: 2023-06-19 — End: 2023-11-24

## 2023-06-19 NOTE — Telephone Encounter (Signed)
Copied from CRM [id number]. Topic: Clinical - Medication Question >> Jun 19, 2023  3:18 PM Almira Coaster wrote: Reason for CRM: Patient was given medicine samples on 06/08/2023 during her last appointment and she wanted to advise that they worked great. She would like for a prescription to be called into the pharmacy but does not remember the name of the medication.

## 2023-06-25 ENCOUNTER — Other Ambulatory Visit: Payer: Self-pay | Admitting: Family Medicine

## 2023-06-27 NOTE — Telephone Encounter (Signed)
Requested Prescriptions  Pending Prescriptions Disp Refills   omeprazole (PRILOSEC) 20 MG capsule [Pharmacy Med Name: OMEPRAZOLE DR 20 MG CAPSULE] 90 capsule 1    Sig: TAKE 1 CAPSULE BY MOUTH EVERY DAY     Gastroenterology: Proton Pump Inhibitors Failed - 06/25/2023 12:30 PM      Failed - Valid encounter within last 12 months    Recent Outpatient Visits           1 year ago Lymphedema of left arm   [facility] Medicine Pickard, Priscille Heidelberg, MD   2 years ago Neuropathy   [facility] Medicine Tanya Nones, Priscille Heidelberg, MD   3 years ago Adult general medical exam   [facility] Medicine Elmore Guise, FNP   4 years ago Acute recurrent pansinusitis   [facility] Medicine Danelle Berry, PA-C   5 years ago Lymphedema of left arm   [facility] Medicine Pickard, Priscille Heidelberg, MD

## 2023-07-05 ENCOUNTER — Telehealth: Payer: Self-pay

## 2023-07-05 NOTE — Telephone Encounter (Signed)
Copied from CRM [id number]. Topic: Clinical - Medication Refill >> Jul 05, 2023  2:34 PM Herbert Seta B wrote: Most Recent Primary Care Visit:  Provider: Lynnea Ferrier T  Department[facility]  Visit Type: OFFICE VISIT  Date: 06/08/2023  Medication: HYDROcodone-acetaminophen (NORCO) 7.5-325 MG tablet  Has the patient contacted their pharmacy? No c2 (Agent: If no, request that the patient contact the pharmacy for the refill. If patient does not wish to contact the pharmacy document the reason why and proceed with request.) (Agent: If yes, when and what did the pharmacy advise?)  Is this the correct pharmacy for this prescription? yes If no, delete pharmacy and type the correct one.  This is the patient's preferred pharmacy:  [id number] [place] [place] - [address] AT [address] [facility] [address] [place] [address] Phone: [phone] Fax: [phone]     Has the prescription been filled recently? yes  Is the patient out of the medication? yes  Has the patient been seen for an appointment in the last year OR does the patient have an upcoming appointment? yes  Can we respond through MyChart? No-PLEASE CALL WITH QUESTIONS!  Agent: Please be advised that Rx refills may take up to 3 business days. We ask that you follow-up with your pharmacy.

## 2023-07-06 ENCOUNTER — Other Ambulatory Visit: Payer: Self-pay | Admitting: Family Medicine

## 2023-07-06 ENCOUNTER — Telehealth: Payer: Self-pay

## 2023-07-06 MED ORDER — HYDROCODONE-ACETAMINOPHEN 7.5-325 MG PO TABS: 1.0000 | ORAL_TABLET | Freq: Four times a day (QID) | ORAL | 0 refills | Status: DC | PRN

## 2023-07-06 NOTE — Telephone Encounter (Signed)
Copied from CRM [id number]. Topic: Clinical - Medical Advice >> Jul 05, 2023  2:35 PM Herbert Seta B wrote: Reason for CRM: Patient recently started new medication Budeson-Glycopyrrol-Formoterol (BREZTRI AEROSPHERE) 160-9-4.8 MCG/ACT AERO and is coughing up mucus in the mornings

## 2023-07-10 NOTE — Telephone Encounter (Signed)
Spoke w/pt, per pt she has not stop taking th Breztri since last msg. Pt stated that her cough has gotten better; still coughing out some mucous and is clear and better than previous. Told pt let wait for another week and see how it is. If she is still having problems then we can schedule an appt for pt to see provider.

## 2023-07-10 NOTE — Telephone Encounter (Signed)
Attempted to call pt, no VM, will try again.

## 2023-08-03 ENCOUNTER — Other Ambulatory Visit: Payer: Self-pay | Admitting: Family Medicine

## 2023-08-03 MED ORDER — HYDROCODONE-ACETAMINOPHEN 7.5-325 MG PO TABS: 1.0000 | ORAL_TABLET | Freq: Four times a day (QID) | ORAL | 0 refills | Status: DC | PRN

## 2023-08-03 NOTE — Telephone Encounter (Signed)
 Copied from CRM [id number]. Topic: Clinical - Medication Refill >> Aug 03, 2023  2:33 PM Tonda B wrote: Most Recent Primary Care Visit:  Provider: DUANNE LOWERS T  Department: [facility]  Visit Type: OFFICE VISIT  Date: 06/08/2023  Medication: ***  Has the patient contacted their pharmacy?  (Agent: If no, request that the patient contact the pharmacy for the refill. If patient does not wish to contact the pharmacy document the reason why and proceed with request.) (Agent: If yes, when and what did the pharmacy advise?)  Is this the correct pharmacy for this prescription?  If no, delete pharmacy and type the correct one.  This is the patient's preferred pharmacy:  [id number] [place], [place] - [address] AT [facility] [address] [place] [address] Phone: [phone] Fax: [phone]  [id number] [place], [place] - [address] [place] [address] Phone: [phone] Fax: [phone]   Has the prescription been filled recently?   Is the patient out of the medication?   Has the patient been seen for an appointment in the last year OR does the patient have an upcoming appointment?   Can we respond through MyChart?   Agent: Please be advised that Rx refills may take up to 3 business days. We ask that you follow-up with your pharmacy.

## 2023-08-31 ENCOUNTER — Other Ambulatory Visit: Payer: Self-pay | Admitting: Family Medicine

## 2023-08-31 NOTE — Telephone Encounter (Signed)
Copied from CRM [id number]. Topic: Clinical - Medication Refill >> Aug 31, 2023  1:32 PM Herbert Seta B wrote: Most Recent Primary Care Visit:  Provider: Lynnea Ferrier T  Department: [facility]  Visit Type: OFFICE VISIT  Date: 06/08/2023  Medication: 1-gabapentin (NEURONTIN) 300 MG capsule   2-HYDROcodone-acetaminophen (NORCO) 7.5-325 MG tablet  Has the patient contacted their pharmacy? Yes-no refills (Agent: If no, request that the patient contact the pharmacy for the refill. If patient does not wish to contact the pharmacy document the reason why and proceed with request.) (Agent: If yes, when and what did the pharmacy advise?)  Is this the correct pharmacy for this prescription? yes If no, delete pharmacy and type the correct one.  This is the patient's preferred pharmacy:  [id number] - [place], [place] - [address] AT [facility] [address]  [place] [address] Phone: [phone] Fax: [phone]    Has the prescription been filled recently? no  Is the patient out of the medication? yes  Has the patient been seen for an appointment in the last year OR does the patient have an upcoming appointment? yes  Can we respond through MyChart? no  Agent: Please be advised that Rx refills may take up to 3 business days. We ask that you follow-up with your pharmacy.

## 2023-08-31 NOTE — Telephone Encounter (Signed)
Last Fill: Gabapentin 05/18/23     Hydrocodone 08/03/23  Last OV: 06/08/23 Next OV: None Scheduled  Routing to provider for review/authorization.

## 2023-09-01 MED ORDER — HYDROCODONE-ACETAMINOPHEN 7.5-325 MG PO TABS: 1.0000 | ORAL_TABLET | Freq: Four times a day (QID) | ORAL | 0 refills | Status: DC | PRN

## 2023-09-01 MED ORDER — GABAPENTIN 300 MG PO CAPS: ORAL_CAPSULE | ORAL | 5 refills | Status: DC

## 2023-10-02 ENCOUNTER — Other Ambulatory Visit: Payer: Self-pay | Admitting: Family Medicine

## 2023-10-02 MED ORDER — HYDROCODONE-ACETAMINOPHEN 7.5-325 MG PO TABS: 1.0000 | ORAL_TABLET | Freq: Four times a day (QID) | ORAL | 0 refills | Status: DC | PRN

## 2023-10-02 NOTE — Telephone Encounter (Signed)
 Patient has refills on Neurontin prescription.

## 2023-10-02 NOTE — Telephone Encounter (Signed)
 Requested medication (s) are due for refill today- yes  Requested medication (s) are on the active medication list -yes  Future visit scheduled -no  Last refill: 09/01/23 #45  Notes to clinic: non delegated Rx  Requested Prescriptions  Pending Prescriptions Disp Refills   HYDROcodone-acetaminophen (NORCO) 7.5-325 MG tablet 45 tablet 0    Sig: Take 1 tablet by mouth every 6 (six) hours as needed for moderate pain (pain score 4-6).     Not Delegated - Analgesics:  Opioid Agonist Combinations Failed - 10/02/2023  1:45 PM      Failed - This refill cannot be delegated      Failed - Urine Drug Screen completed in last 360 days      Failed - Valid encounter within last 3 months    Recent Outpatient Visits           1 year ago Lymphedema of left arm   [facility] Pickard, Priscille Heidelberg, MD   2 years ago Neuropathy   [facility] Pickard, Priscille Heidelberg, MD   3 years ago Adult general medical exam   [facility] Lawson Fiscal A, FNP   5 years ago Acute recurrent pansinusitis   [facility] Danelle Berry, PA-C   5 years ago Lymphedema of left arm   [facility] Pickard, Priscille Heidelberg, MD                 Requested Prescriptions  Pending Prescriptions Disp Refills   HYDROcodone-acetaminophen (NORCO) 7.5-325 MG tablet 45 tablet 0    Sig: Take 1 tablet by mouth every 6 (six) hours as needed for moderate pain (pain score 4-6).     Not Delegated - Analgesics:  Opioid Agonist Combinations Failed - 10/02/2023  1:45 PM      Failed - This refill cannot be delegated      Failed - Urine Drug Screen completed in last 360 days      Failed - Valid encounter within last 3 months    Recent Outpatient Visits           1 year ago Lymphedema of left arm   [facility] Pickard, Priscille Heidelberg, MD   2 years ago Neuropathy   [facility] Tanya Nones, Priscille Heidelberg, MD   3 years ago Adult general medical exam    [facility] Elmore Guise, FNP   5 years ago Acute recurrent pansinusitis   [facility] Danelle Berry, PA-C   5 years ago Lymphedema of left arm   [facility] Pickard, Priscille Heidelberg, MD

## 2023-10-02 NOTE — Telephone Encounter (Signed)
 Copied from CRM [id number]. Topic: Clinical - Medication Refill >> Oct 02, 2023 11:57 AM Clide Dales wrote: Most Recent Primary Care Visit:  Provider: Lynnea Ferrier T  Department: [facility]  Visit Type: OFFICE VISIT  Date: 06/08/2023  Medication:  gabapentin (NEURONTIN) 300 MG capsule HYDROcodone-acetaminophen (NORCO) 7.5-325 MG tablet  Has the patient contacted their pharmacy? No (Agent: If no, request that the patient contact the pharmacy for the refill. If patient does not wish to contact the pharmacy document the reason why and proceed with request.) (Agent: If yes, when and what did the pharmacy advise?)  Is this the correct pharmacy for this prescription? Yes If no, delete pharmacy and type the correct one.  This is the patient's preferred pharmacy:  [id number] - [place], [place] - [address] AT [facility] [address] [place] [address] Phone: [phone] Fax: [phone]   Has the prescription been filled recently? Yes  Is the patient out of the medication? Yes  Has the patient been seen for an appointment in the last year OR does the patient have an upcoming appointment? Yes  Can we respond through MyChart? No  Agent: Please be advised that Rx refills may take up to 3 business days. We ask that you follow-up with your pharmacy.

## 2023-10-26 ENCOUNTER — Other Ambulatory Visit: Payer: Self-pay | Admitting: Family Medicine

## 2023-10-26 NOTE — Telephone Encounter (Signed)
 Copied from CRM [id number]. Topic: Clinical - Medication Refill >> Oct 26, 2023  2:10 PM Turkey B wrote: Most Recent Primary Care Visit:  Provider: Lynnea Ferrier T  Department: [facility]  Visit Type: OFFICE VISIT  Date: 06/08/2023  Medication: gabapentin (NEURONTIN) 300 MG capsule /HYDROcodone-acetaminophen (NORCO) 7.5-325 MG tablet  Has the patient contacted their pharmacy? No Because of type of med (Agent: If yes, when and what did the pharmacy advise?)contact pcp  Is this the correct pharmacy for this prescription? yes .  This is the patient's preferred pharmacy:  [id number] - [place], [place] - [address] AT [facility] [address] [place] [address] Phone: [phone] Fax: [phone]    Has the prescription been filled recently? no  Is the patient out of the medication? yes  Has the patient been seen for an appointment in the last year OR does the patient have an upcoming appointment? yes  Can we respond through MyChart? no  Agent: Please be advised that Rx refills may take up to 3 business days. We ask that you follow-up with your pharmacy.

## 2023-10-27 NOTE — Telephone Encounter (Signed)
 Requested medication (s) are due for refill today: yes  Requested medication (s) are on the active medication list: yes  Last refill:  10/04/23  Future visit scheduled: yes  Notes to clinic:  Unable to refill per protocol, courtesy refill already given, routing for provider approval.      Requested Prescriptions  Pending Prescriptions Disp Refills   gabapentin (NEURONTIN) 300 MG capsule 30 capsule 5    Sig: TAKE 1 CAPSULE BY MOUTH EVERYDAY AT BEDTIME     Neurology: Anticonvulsants - gabapentin Failed - 10/27/2023  3:34 PM      Failed - Cr in normal range and within 360 days    Creatinine  Date Value Ref Range Status  12/23/2016 0.7 0.6 - 1.1 mg/dL Final   Creat  Date Value Ref Range Status  06/08/2023 0.55 (L) 0.60 - 0.95 mg/dL Final         Failed - Completed PHQ-2 or PHQ-9 in the last 360 days      Passed - Valid encounter within last 12 months    Recent Outpatient Visits           4 months ago Primary osteoarthritis involving multiple joints   [place] Donita Brooks, MD   1 year ago Left maxillary sinusitis   [place] [facility] Pickard, Priscille Heidelberg, MD               HYDROcodone-acetaminophen (NORCO) 7.5-325 MG tablet 45 tablet 0    Sig: Take 1 tablet by mouth every 6 (six) hours as needed for moderate pain (pain score 4-6).     Not Delegated - Analgesics:  Opioid Agonist Combinations Failed - 10/27/2023  3:34 PM      Failed - This refill cannot be delegated      Failed - Urine Drug Screen completed in last 360 days      Failed - Valid encounter within last 3 months    Recent Outpatient Visits           4 months ago Primary osteoarthritis involving multiple joints   [place] [facility] Pickard, Priscille Heidelberg, MD   1 year ago Left maxillary sinusitis   [place] [facility] Pickard, Priscille Heidelberg, MD

## 2023-10-30 MED ORDER — GABAPENTIN 300 MG PO CAPS: ORAL_CAPSULE | ORAL | 5 refills | Status: DC

## 2023-10-30 MED ORDER — HYDROCODONE-ACETAMINOPHEN 7.5-325 MG PO TABS: 1.0000 | ORAL_TABLET | Freq: Four times a day (QID) | ORAL | 0 refills | Status: DC | PRN

## 2023-11-09 ENCOUNTER — Encounter: Payer: Self-pay | Admitting: Family Medicine

## 2023-11-09 ENCOUNTER — Ambulatory Visit: Admitting: Family Medicine

## 2023-11-09 VITALS — BP 118/64 | HR 62 | Temp 97.6°F | Ht 65.5 in | Wt 179.0 lb

## 2023-11-09 DIAGNOSIS — I890 Lymphedema, not elsewhere classified: Secondary | ICD-10-CM

## 2023-11-09 NOTE — Progress Notes (Signed)
 Subjective:    Patient ID: [name], female    DOB: 30-Jan-1941, 83 y.o.   MRN: [id number]  HPI   Patient is a very sweet 83 year old Caucasian female who comes in today for swelling in her left arm.  She has a history of breast cancer in both breast.  She had cancer in her left breast in her 51s.  They removed the lymph node from her left axilla.  Afterward, the patient has been dealing with lymphedema in her left arm for years.  Is now worsening.  She reports heaviness and pain in her arm due to the swelling.  She has +2 edema in the left arm from the wrist to the shoulder.  She also has weeping edema coming from several scratches on her forearm Past Medical History:  Diagnosis Date   Anemia    Arthritis    "hands, feet; shoulders; all over" (02/23/2018)   Asthma    "as a child; acts up a little sometimes" (02/23/2018)   Breast cancer, left breast (HCC) 1967   Cancer of right breast (HCC) 2003   Depression    Diverticulosis    GERD (gastroesophageal reflux disease)    History of blood transfusion 2003   "several; before my right breast OR"   History of hiatal hernia    Hypertension    after weight loss, pt came off medications and has not had it since   Lymphedema of left arm    Osteoporosis    Pneumonia    "once" (02/23/2018)   Vitamin D deficiency    Past Surgical History:  Procedure Laterality Date   ABDOMINAL HYSTERECTOMY     APPENDECTOMY     BASAL CELL CARCINOMA EXCISION Right    "top of my forehead"   BREAST BIOPSY Left 1967   BREAST BIOPSY Right 2003   CATARACT EXTRACTION W/ INTRAOCULAR LENS  IMPLANT, BILATERAL Bilateral    CHOLECYSTECTOMY OPEN     COLONOSCOPY     JOINT REPLACEMENT     MASTECTOMY Left 1967   MASTECTOMY Right 2003   REVERSE SHOULDER ARTHROPLASTY Left 02/23/2018   REVERSE SHOULDER ARTHROPLASTY Left 02/23/2018   Procedure: REVERSE LEFT SHOULDER ARTHROPLASTY;  Surgeon: Beverely Low, MD;  Location: [facility] OR;  Service: Orthopedics;  Laterality:  Left;   TONSILLECTOMY     TOTAL KNEE ARTHROPLASTY Bilateral    Current Outpatient Medications on File Prior to Visit  Medication Sig Dispense Refill   albuterol (PROVENTIL) (2.5 MG/3ML) 0.083% nebulizer solution USE 1 VIAL VIA NEBULIZER EVERY 6 HOURS AS NEEDED FOR WHEEZING OR FOR SHORTNESS OF BREATH 75 mL 1   albuterol (VENTOLIN HFA) 108 (90 Base) MCG/ACT inhaler INHALE 2 PUFFS INTO LUNGS EVERY 6 HOURS AS NEEDED FOR WHEEZE/SHORTNESS OF BREATH 18 each 0   Budeson-Glycopyrrol-Formoterol (BREZTRI AEROSPHERE) 160-9-4.8 MCG/ACT AERO Inhale 2 puffs into the lungs 2 (two) times daily. 10.7 g 11   escitalopram (LEXAPRO) 10 MG tablet TAKE 1 TABLET BY MOUTH EVERY DAY 90 tablet 2   famotidine (PEPCID) 20 MG tablet TAKE 1 TABLET (20 MG TOTAL) BY MOUTH 2 (TWO) TIMES DAILY AS NEEDED FOR HEARTBURN OR INDIGESTION. 180 tablet 1   fluticasone (FLONASE) 50 MCG/ACT nasal spray SPRAY 2 SPRAY INTO EACH NOSTRIL EVERY DAY. 48 mL 2   gabapentin (NEURONTIN) 300 MG capsule TAKE 1 CAPSULE BY MOUTH EVERYDAY AT BEDTIME 30 capsule 5   HYDROcodone-acetaminophen (NORCO) 7.5-325 MG tablet Take 1 tablet by mouth every 6 (six) hours as needed for moderate pain (pain  score 4-6). 45 tablet 0   levocetirizine (XYZAL) 5 MG tablet TAKE 1 TABLET BY MOUTH EVERY DAY IN THE EVENING 90 tablet 0   montelukast (SINGULAIR) 10 MG tablet TAKE 1 TABLET BY MOUTH EVERYDAY AT BEDTIME 90 tablet 1   Multiple Vitamin (MULTIVITAMIN WITH MINERALS) TABS tablet Take 1 tablet by mouth at bedtime. CENTRUM FOR WOMEN 50+     omeprazole (PRILOSEC) 20 MG capsule TAKE 1 CAPSULE BY MOUTH EVERY DAY 90 capsule 1   RESTASIS 0.05 % ophthalmic emulsion 1 drop 2 (two) times daily.     vitamin B-12 (CYANOCOBALAMIN) 1000 MCG tablet Take 1,000 mcg by mouth at bedtime.     No current facility-administered medications on file prior to visit.   Allergies  Allergen Reactions   Penicillins Hives, Rash and Other (See Comments)    PATIENT HAS HAD A PCN REACTION WITH  IMMEDIATE RASH, FACIAL/TONGUE/THROAT SWELLING, SOB, OR LIGHTHEADEDNESS WITH HYPOTENSION:  #  #  YES  #  #  Has patient had a PCN reaction causing severe rash involving mucus membranes or skin necrosis: No Has patient had a PCN reaction that required hospitalization: No Has patient had a PCN reaction occurring within the last 10 years: No If all of the above answers are "NO", then may proceed with Cephalosporin use.    Social History   Socioeconomic History   Marital status: Divorced    Spouse name: Not on file   Number of children: 2   Years of education: Not on file   Highest education level: Not on file  Occupational History   Not on file  Tobacco Use   Smoking status: Former    Current packs/day: 0.00    Average packs/day: 0.8 packs/day for 4.0 years (3.0 ttl pk-yrs)    Types: Cigarettes    Start date: 74    Quit date: 55    Years since quitting: 67.3   Smokeless tobacco: Never  Vaping Use   Vaping status: Never Used  Substance and Sexual Activity   Alcohol use: Not Currently    Alcohol/week: 0.0 standard drinks of alcohol    Comment: 02/23/2018 "glass of wine 1-2 times/yr; if that"   Drug use: Never   Sexual activity: Not Currently  Other Topics Concern   Not on file  Social History Narrative   1 son and 1 daughter.   5 grandchildren   15 great grandchildren   Social Drivers of Corporate investment banker Strain: Low Risk  (06/16/2022)   Overall Financial Resource Strain (CARDIA)    Difficulty of Paying Living Expenses: Not hard at all  Food Insecurity: No Food Insecurity (06/16/2022)   Hunger Vital Sign    Worried About Running Out of Food in the Last Year: Never true    Ran Out of Food in the Last Year: Never true  Transportation Needs: No Transportation Needs (06/16/2022)   PRAPARE - Administrator, Civil Service (Medical): No    Lack of Transportation (Non-Medical): No  Physical Activity: Sufficiently Active (06/16/2022)   Exercise Vital  Sign    Days of Exercise per Week: 7 days    Minutes of Exercise per Session: 30 min  Stress: No Stress Concern Present (06/16/2022)   [employer] of Occupational Health - Occupational Stress Questionnaire    Feeling of Stress : Not at all  Social Connections: Moderately Integrated (06/16/2022)   Social Connection and Isolation Panel [NHANES]    Frequency of Communication with Friends and Family: More  than three times a week    Frequency of Social Gatherings with Friends and Family: More than three times a week    Attends Religious Services: More than 4 times per year    Active Member of [employer] or Organizations: Yes    Attends Banker Meetings: More than 4 times per year    Marital Status: Divorced  Intimate Partner Violence: Not At Risk (06/16/2022)   Humiliation, Afraid, Rape, and Kick questionnaire    Fear of Current or Ex-Partner: No    Emotionally Abused: No    Physically Abused: No    Sexually Abused: No      Review of Systems  All other systems reviewed and are negative.      Objective:   Physical Exam Constitutional:      General: She is not in acute distress.    Appearance: Normal appearance. She is well-developed. She is obese. She is not ill-appearing or toxic-appearing.  Cardiovascular:     Rate and Rhythm: Normal rate and regular rhythm.     Heart sounds: Normal heart sounds. No murmur heard. Pulmonary:     Effort: Pulmonary effort is normal. No respiratory distress.     Breath sounds: Wheezing present. No rales.  Abdominal:     General: Bowel sounds are normal. There is no distension.     Palpations: Abdomen is soft.     Tenderness: There is no abdominal tenderness. There is no rebound.  Musculoskeletal:     Left upper arm: Swelling and tenderness present.     Left forearm: Swelling, edema and tenderness present.  Neurological:     Mental Status: She is alert.          Assessment & Plan:  Lymphedema of left arm I placed the  patient into an Unna boot" on her left arm from her wrist to her armpit.  Recheck on Monday.  Hopefully this will help with the swelling so that then the patient can be fitted for a compression wrap at her local DME.

## 2023-11-13 ENCOUNTER — Encounter: Payer: Self-pay | Admitting: Family Medicine

## 2023-11-13 ENCOUNTER — Ambulatory Visit: Admitting: Family Medicine

## 2023-11-13 VITALS — BP 120/62 | HR 73 | Temp 97.7°F | Ht 65.5 in | Wt 176.0 lb

## 2023-11-13 DIAGNOSIS — I890 Lymphedema, not elsewhere classified: Secondary | ICD-10-CM

## 2023-11-13 NOTE — Progress Notes (Signed)
 Subjective:    Patient ID: [name], female    DOB: [birth year], 83 y.o.   MRN: [id number]  HPI  11/09/23 Patient is a very sweet 83 year old Caucasian female who comes in today for swelling in her left arm.  She has a history of breast cancer in both breast.  She had cancer in her left breast in her 45s.  They removed the lymph node from her left axilla.  Afterward, the patient has been dealing with lymphedema in her left arm for years.  Is now worsening.  She reports heaviness and pain in her arm due to the swelling.  She has +2 edema in the left arm from the wrist to the shoulder.  She also has weeping edema coming from several scratches on her forearm.  At that time, my plan was: I placed the patient into an Unna boot" on her left arm from her wrist to her armpit.  Recheck on Monday.  Hopefully this will help with the swelling so that then the patient can be fitted for a compression wrap at her local DME.  Wt Readings from Last 3 Encounters:  11/13/23 176 lb (79.8 kg)  11/09/23 179 lb (81.2 kg)  06/08/23 178 lb (80.7 kg)   11/13/23 Patient has lost 3 pounds since her last visit.  She states that she has been having to urinate more since we put the compression wrap on.  The swelling in her forearm distal to her elbow has improved dramatically.  She still has substantial edema in her upper arm proximal to the elbow. Past Medical History:  Diagnosis Date   Anemia    Arthritis    "hands, feet; shoulders; all over" (02/23/2018)   Asthma    "as a child; acts up a little sometimes" (02/23/2018)   Breast cancer, left breast (HCC) 1967   Cancer of right breast (HCC) 2003   Depression    Diverticulosis    GERD (gastroesophageal reflux disease)    History of blood transfusion 2003   "several; before my right breast OR"   History of hiatal hernia    Hypertension    after weight loss, pt came off medications and has not had it since   Lymphedema of left arm    Osteoporosis     Pneumonia    "once" (02/23/2018)   Vitamin D deficiency    Past Surgical History:  Procedure Laterality Date   ABDOMINAL HYSTERECTOMY     APPENDECTOMY     BASAL CELL CARCINOMA EXCISION Right    "top of my forehead"   BREAST BIOPSY Left 1967   BREAST BIOPSY Right 2003   CATARACT EXTRACTION W/ INTRAOCULAR LENS  IMPLANT, BILATERAL Bilateral    CHOLECYSTECTOMY OPEN     COLONOSCOPY     JOINT REPLACEMENT     MASTECTOMY Left 1967   MASTECTOMY Right 2003   REVERSE SHOULDER ARTHROPLASTY Left 02/23/2018   REVERSE SHOULDER ARTHROPLASTY Left 02/23/2018   Procedure: REVERSE LEFT SHOULDER ARTHROPLASTY;  Surgeon: Beverely Low, MD;  [facility];  Service: Orthopedics;  Laterality: Left;   TONSILLECTOMY     TOTAL KNEE ARTHROPLASTY Bilateral    Current Outpatient Medications on File Prior to Visit  Medication Sig Dispense Refill   albuterol (PROVENTIL) (2.5 MG/3ML) 0.083% nebulizer solution USE 1 VIAL VIA NEBULIZER EVERY 6 HOURS AS NEEDED FOR WHEEZING OR FOR SHORTNESS OF BREATH 75 mL 1   albuterol (VENTOLIN HFA) 108 (90 Base) MCG/ACT inhaler INHALE 2 PUFFS INTO LUNGS EVERY 6  HOURS AS NEEDED FOR WHEEZE/SHORTNESS OF BREATH 18 each 0   Budeson-Glycopyrrol-Formoterol (BREZTRI AEROSPHERE) 160-9-4.8 MCG/ACT AERO Inhale 2 puffs into the lungs 2 (two) times daily. 10.7 g 11   escitalopram (LEXAPRO) 10 MG tablet TAKE 1 TABLET BY MOUTH EVERY DAY 90 tablet 2   famotidine (PEPCID) 20 MG tablet TAKE 1 TABLET (20 MG TOTAL) BY MOUTH 2 (TWO) TIMES DAILY AS NEEDED FOR HEARTBURN OR INDIGESTION. 180 tablet 1   fluticasone (FLONASE) 50 MCG/ACT nasal spray SPRAY 2 SPRAY INTO EACH NOSTRIL EVERY DAY. 48 mL 2   gabapentin (NEURONTIN) 300 MG capsule TAKE 1 CAPSULE BY MOUTH EVERYDAY AT BEDTIME 30 capsule 5   HYDROcodone-acetaminophen (NORCO) 7.5-325 MG tablet Take 1 tablet by mouth every 6 (six) hours as needed for moderate pain (pain score 4-6). 45 tablet 0   levocetirizine (XYZAL) 5 MG tablet TAKE 1 TABLET BY MOUTH EVERY  DAY IN THE EVENING 90 tablet 0   montelukast (SINGULAIR) 10 MG tablet TAKE 1 TABLET BY MOUTH EVERYDAY AT BEDTIME 90 tablet 1   Multiple Vitamin (MULTIVITAMIN WITH MINERALS) TABS tablet Take 1 tablet by mouth at bedtime. CENTRUM FOR WOMEN 50+     omeprazole (PRILOSEC) 20 MG capsule TAKE 1 CAPSULE BY MOUTH EVERY DAY 90 capsule 1   RESTASIS 0.05 % ophthalmic emulsion 1 drop 2 (two) times daily.     vitamin B-12 (CYANOCOBALAMIN) 1000 MCG tablet Take 1,000 mcg by mouth at bedtime.     No current facility-administered medications on file prior to visit.   Allergies  Allergen Reactions   Penicillins Hives, Rash and Other (See Comments)    PATIENT HAS HAD A PCN REACTION WITH IMMEDIATE RASH, FACIAL/TONGUE/THROAT SWELLING, SOB, OR LIGHTHEADEDNESS WITH HYPOTENSION:  #  #  YES  #  #  Has patient had a PCN reaction causing severe rash involving mucus membranes or skin necrosis: No Has patient had a PCN reaction that required hospitalization: No Has patient had a PCN reaction occurring within the last 10 years: No If all of the above answers are "NO", then may proceed with Cephalosporin use.    Social History   Socioeconomic History   Marital status: Divorced    Spouse name: Not on file   Number of children: 2   Years of education: Not on file   Highest education level: Not on file  Occupational History   Not on file  Tobacco Use   Smoking status: Former    Current packs/day: 0.00    Average packs/day: 0.8 packs/day for 4.0 years (3.0 ttl pk-yrs)    Types: Cigarettes    Start date: 4    Quit date: 80    Years since quitting: 67.3   Smokeless tobacco: Never  Vaping Use   Vaping status: Never Used  Substance and Sexual Activity   Alcohol use: Not Currently    Alcohol/week: 0.0 standard drinks of alcohol    Comment: 02/23/2018 "glass of wine 1-2 times/yr; if that"   Drug use: Never   Sexual activity: Not Currently  Other Topics Concern   Not on file  Social History Narrative   1  son and 1 daughter.   5 grandchildren   15 great grandchildren   Social Drivers of Corporate investment banker Strain: Low Risk  (06/16/2022)   Overall Financial Resource Strain (CARDIA)    Difficulty of Paying Living Expenses: Not hard at all  Food Insecurity: No Food Insecurity (06/16/2022)   Hunger Vital Sign    Worried About  Running Out of Food in the Last Year: Never true    Ran Out of Food in the Last Year: Never true  Transportation Needs: No Transportation Needs (06/16/2022)   PRAPARE - Administrator, Civil Service (Medical): No    Lack of Transportation (Non-Medical): No  Physical Activity: Sufficiently Active (06/16/2022)   Exercise Vital Sign    Days of Exercise per Week: 7 days    Minutes of Exercise per Session: 30 min  Stress: No Stress Concern Present (06/16/2022)   [employer] of Occupational Health - Occupational Stress Questionnaire    Feeling of Stress : Not at all  Social Connections: Moderately Integrated (06/16/2022)   Social Connection and Isolation Panel [NHANES]    Frequency of Communication with Friends and Family: More than three times a week    Frequency of Social Gatherings with Friends and Family: More than three times a week    Attends Religious Services: More than 4 times per year    Active Member of [employer] or Organizations: Yes    Attends Banker Meetings: More than 4 times per year    Marital Status: Divorced  Intimate Partner Violence: Not At Risk (06/16/2022)   Humiliation, Afraid, Rape, and Kick questionnaire    Fear of Current or Ex-Partner: No    Emotionally Abused: No    Physically Abused: No    Sexually Abused: No      Review of Systems  All other systems reviewed and are negative.      Objective:   Physical Exam Constitutional:      General: She is not in acute distress.    Appearance: Normal appearance. She is well-developed. She is obese. She is not ill-appearing or toxic-appearing.   Cardiovascular:     Rate and Rhythm: Normal rate and regular rhythm.     Heart sounds: Normal heart sounds. No murmur heard. Pulmonary:     Effort: Pulmonary effort is normal. No respiratory distress.     Breath sounds: Wheezing present. No rales.  Abdominal:     General: Bowel sounds are normal. There is no distension.     Palpations: Abdomen is soft.     Tenderness: There is no abdominal tenderness. There is no rebound.  Musculoskeletal:     Left upper arm: Swelling and tenderness present.     Left forearm: Swelling, edema and tenderness present.  Neurological:     Mental Status: She is alert.          Assessment & Plan:  Lymphedema of left arm I placed the patient into an "Unna boot" on her left arm from her wrist to her armpit.  Recheck on Thursday.  Hopefully this will help with the swelling so that then the patient can be fitted for a compression wrap at her local DME.

## 2023-11-16 ENCOUNTER — Ambulatory Visit: Admitting: Family Medicine

## 2023-11-16 ENCOUNTER — Encounter: Payer: Self-pay | Admitting: Family Medicine

## 2023-11-16 VITALS — BP 118/62 | HR 59 | Temp 97.6°F | Ht 65.5 in | Wt 174.4 lb

## 2023-11-16 DIAGNOSIS — I890 Lymphedema, not elsewhere classified: Secondary | ICD-10-CM

## 2023-11-16 MED ORDER — LEVOCETIRIZINE DIHYDROCHLORIDE 5 MG PO TABS: 5.0000 mg | ORAL_TABLET | Freq: Every evening | ORAL | 5 refills | Status: DC

## 2023-11-16 NOTE — Progress Notes (Signed)
 Subjective:    Patient ID: [name], female    DOB: 1941-07-09, 83 y.o.   MRN: [id number]  HPI  11/09/23 Patient is a very sweet 83 year old Caucasian female who comes in today for swelling in her left arm.  She has a history of breast cancer in both breast.  She had cancer in her left breast in her 42s.  They removed the lymph node from her left axilla.  Afterward, the patient has been dealing with lymphedema in her left arm for years.  Is now worsening.  She reports heaviness and pain in her arm due to the swelling.  She has +2 edema in the left arm from the wrist to the shoulder.  She also has weeping edema coming from several scratches on her forearm.  At that time, my plan was: I placed the patient into an Unna boot" on her left arm from her wrist to her armpit.  Recheck on Monday.  Hopefully this will help with the swelling so that then the patient can be fitted for a compression wrap at her local DME.  Wt Readings from Last 3 Encounters:  11/16/23 174 lb 6.4 oz (79.1 kg)  11/13/23 176 lb (79.8 kg)  11/09/23 179 lb (81.2 kg)   11/13/23 Patient has lost 3 pounds since her last visit.  She states that she has been having to urinate more since we put the compression wrap on.  The swelling in her forearm distal to her elbow has improved dramatically.  She still has substantial edema in her upper arm proximal to the elbow.  11/16/23 Swelling in patient's left arm has improved dramatically.  She does have some irritation in the antecubital fossa due to the presence of an Unna boot for the last 6 days.  There is no skin breakdown or cellulitis.  She still has pitting edema in her upper arm adjacent to her axilla however the remainder of the arm shows no evidence of lymphedema. Past Medical History:  Diagnosis Date   Anemia    Arthritis    "hands, feet; shoulders; all over" (02/23/2018)   Asthma    "as a child; acts up a little sometimes" (02/23/2018)   Breast cancer, left breast (HCC)  1967   Cancer of right breast (HCC) 2003   Depression    Diverticulosis    GERD (gastroesophageal reflux disease)    History of blood transfusion 2003   "several; before my right breast OR"   History of hiatal hernia    Hypertension    after weight loss, pt came off medications and has not had it since   Lymphedema of left arm    Osteoporosis    Pneumonia    "once" (02/23/2018)   Vitamin D deficiency    Past Surgical History:  Procedure Laterality Date   ABDOMINAL HYSTERECTOMY     APPENDECTOMY     BASAL CELL CARCINOMA EXCISION Right    "top of my forehead"   BREAST BIOPSY Left 1967   BREAST BIOPSY Right 2003   CATARACT EXTRACTION W/ INTRAOCULAR LENS  IMPLANT, BILATERAL Bilateral    CHOLECYSTECTOMY OPEN     COLONOSCOPY     JOINT REPLACEMENT     MASTECTOMY Left 1967   MASTECTOMY Right 2003   REVERSE SHOULDER ARTHROPLASTY Left 02/23/2018   REVERSE SHOULDER ARTHROPLASTY Left 02/23/2018   Procedure: REVERSE LEFT SHOULDER ARTHROPLASTY;  Surgeon: Beverely Low, MD;  Location: [facility] OR;  Service: Orthopedics;  Laterality: Left;   TONSILLECTOMY  TOTAL KNEE ARTHROPLASTY Bilateral    Current Outpatient Medications on File Prior to Visit  Medication Sig Dispense Refill   albuterol (PROVENTIL) (2.5 MG/3ML) 0.083% nebulizer solution USE 1 VIAL VIA NEBULIZER EVERY 6 HOURS AS NEEDED FOR WHEEZING OR FOR SHORTNESS OF BREATH 75 mL 1   albuterol (VENTOLIN HFA) 108 (90 Base) MCG/ACT inhaler INHALE 2 PUFFS INTO LUNGS EVERY 6 HOURS AS NEEDED FOR WHEEZE/SHORTNESS OF BREATH 18 each 0   Budeson-Glycopyrrol-Formoterol (BREZTRI AEROSPHERE) 160-9-4.8 MCG/ACT AERO Inhale 2 puffs into the lungs 2 (two) times daily. 10.7 g 11   escitalopram (LEXAPRO) 10 MG tablet TAKE 1 TABLET BY MOUTH EVERY DAY 90 tablet 2   famotidine (PEPCID) 20 MG tablet TAKE 1 TABLET (20 MG TOTAL) BY MOUTH 2 (TWO) TIMES DAILY AS NEEDED FOR HEARTBURN OR INDIGESTION. 180 tablet 1   fluticasone (FLONASE) 50 MCG/ACT nasal spray SPRAY 2  SPRAY INTO EACH NOSTRIL EVERY DAY. 48 mL 2   gabapentin (NEURONTIN) 300 MG capsule TAKE 1 CAPSULE BY MOUTH EVERYDAY AT BEDTIME 30 capsule 5   HYDROcodone-acetaminophen (NORCO) 7.5-325 MG tablet Take 1 tablet by mouth every 6 (six) hours as needed for moderate pain (pain score 4-6). 45 tablet 0   levocetirizine (XYZAL) 5 MG tablet TAKE 1 TABLET BY MOUTH EVERY DAY IN THE EVENING 90 tablet 0   montelukast (SINGULAIR) 10 MG tablet TAKE 1 TABLET BY MOUTH EVERYDAY AT BEDTIME 90 tablet 1   Multiple Vitamin (MULTIVITAMIN WITH MINERALS) TABS tablet Take 1 tablet by mouth at bedtime. CENTRUM FOR WOMEN 50+     omeprazole (PRILOSEC) 20 MG capsule TAKE 1 CAPSULE BY MOUTH EVERY DAY 90 capsule 1   RESTASIS 0.05 % ophthalmic emulsion 1 drop 2 (two) times daily.     vitamin B-12 (CYANOCOBALAMIN) 1000 MCG tablet Take 1,000 mcg by mouth at bedtime.     No current facility-administered medications on file prior to visit.   Allergies  Allergen Reactions   Penicillins Hives, Rash and Other (See Comments)    PATIENT HAS HAD A PCN REACTION WITH IMMEDIATE RASH, FACIAL/TONGUE/THROAT SWELLING, SOB, OR LIGHTHEADEDNESS WITH HYPOTENSION:  #  #  YES  #  #  Has patient had a PCN reaction causing severe rash involving mucus membranes or skin necrosis: No Has patient had a PCN reaction that required hospitalization: No Has patient had a PCN reaction occurring within the last 10 years: No If all of the above answers are "NO", then may proceed with Cephalosporin use.    Social History   Socioeconomic History   Marital status: Divorced    Spouse name: Not on file   Number of children: 2   Years of education: Not on file   Highest education level: Not on file  Occupational History   Not on file  Tobacco Use   Smoking status: Former    Current packs/day: 0.00    Average packs/day: 0.8 packs/day for 4.0 years (3.0 ttl pk-yrs)    Types: Cigarettes    Start date: 48    Quit date: 54    Years since quitting: 67.3    Smokeless tobacco: Never  Vaping Use   Vaping status: Never Used  Substance and Sexual Activity   Alcohol use: Not Currently    Alcohol/week: 0.0 standard drinks of alcohol    Comment: 02/23/2018 "glass of wine 1-2 times/yr; if that"   Drug use: Never   Sexual activity: Not Currently  Other Topics Concern   Not on file  Social History Narrative  1 son and 1 daughter.   5 grandchildren   15 great grandchildren   Social Drivers of Corporate investment banker Strain: Low Risk  (06/16/2022)   Overall Financial Resource Strain (CARDIA)    Difficulty of Paying Living Expenses: Not hard at all  Food Insecurity: No Food Insecurity (06/16/2022)   Hunger Vital Sign    Worried About Running Out of Food in the Last Year: Never true    Ran Out of Food in the Last Year: Never true  Transportation Needs: No Transportation Needs (06/16/2022)   PRAPARE - Administrator, Civil Service (Medical): No    Lack of Transportation (Non-Medical): No  Physical Activity: Sufficiently Active (06/16/2022)   Exercise Vital Sign    Days of Exercise per Week: 7 days    Minutes of Exercise per Session: 30 min  Stress: No Stress Concern Present (06/16/2022)   [employer] of Occupational Health - Occupational Stress Questionnaire    Feeling of Stress : Not at all  Social Connections: Moderately Integrated (06/16/2022)   Social Connection and Isolation Panel [NHANES]    Frequency of Communication with Friends and Family: More than three times a week    Frequency of Social Gatherings with Friends and Family: More than three times a week    Attends Religious Services: More than 4 times per year    Active Member of [employer] or Organizations: Yes    Attends Banker Meetings: More than 4 times per year    Marital Status: Divorced  Intimate Partner Violence: Not At Risk (06/16/2022)   Humiliation, Afraid, Rape, and Kick questionnaire    Fear of Current or Ex-Partner: No    Emotionally  Abused: No    Physically Abused: No    Sexually Abused: No      Review of Systems  All other systems reviewed and are negative.      Objective:   Physical Exam Constitutional:      General: She is not in acute distress.    Appearance: Normal appearance. She is well-developed. She is obese. She is not ill-appearing or toxic-appearing.  Cardiovascular:     Rate and Rhythm: Normal rate and regular rhythm.     Heart sounds: Normal heart sounds. No murmur heard. Pulmonary:     Effort: Pulmonary effort is normal. No respiratory distress.     Breath sounds: Wheezing present. No rales.  Abdominal:     General: Bowel sounds are normal. There is no distension.     Palpations: Abdomen is soft.     Tenderness: There is no abdominal tenderness. There is no rebound.  Musculoskeletal:     Left upper arm: Swelling present. No tenderness.     Left forearm: No swelling, edema or tenderness.  Neurological:     Mental Status: She is alert.          Assessment & Plan:  Lymphedema of left arm Recommended the patient go to a local durable medical equipment provider and be fitted for a lymphedema compression sleeve for her left arm now that the swelling is manageable.  Recommended that she wear this compression sleeve 24/7 to help prevent recurrence of the fluid.

## 2023-11-24 ENCOUNTER — Other Ambulatory Visit: Payer: Self-pay | Admitting: Family Medicine

## 2023-11-24 DIAGNOSIS — J45909 Unspecified asthma, uncomplicated: Secondary | ICD-10-CM

## 2023-11-24 NOTE — Telephone Encounter (Signed)
 Copied from CRM [id number]. Topic: Clinical - Medication Refill >> Nov 24, 2023  2:52 PM Juluis Ok wrote: Most Recent Primary Care Visit:  Provider: Eliane Grooms T  Department: [facility]  Visit Type: OFFICE VISIT  Date: 11/16/2023  Medication: HYDROcodone -acetaminophen  (NORCO) 7.5-325 MG tablet Budeson-Glycopyrrol-Formoterol (BREZTRI  AEROSPHERE) 160-9-4.8 MCG/ACT AERO gabapentin  (NEURONTIN ) 300 MG capsule  Has the patient contacted their pharmacy? Yes (Agent: If no, request that the patient contact the pharmacy for the refill. If patient does not wish to contact the pharmacy document the reason why and proceed with request.) (Agent: If yes, when and what did the pharmacy advise?)  Is this the correct pharmacy for this prescription? Yes If no, delete pharmacy and type the correct one.  This is the patient's preferred pharmacy:  [id number] - [place], [place] - [address] AT [facility] [address] [place] [address] Phone: [phone] Fax: [phone]     Has the prescription been filled recently? No  Is the patient out of the medication? No  Has the patient been seen for an appointment in the last year OR does the patient have an upcoming appointment? Yes  Can we respond through MyChart? No  Agent: Please be advised that Rx refills may take up to 3 business days. We ask that you follow-up with your pharmacy.

## 2023-11-24 NOTE — Telephone Encounter (Signed)
 Requested medication (s) are due for refill today: yes  Requested medication (s) are on the active medication list: yes  Last refill:  Breztri  06/19/23, gabapentin  and Hydrocodone  10/30/23  Future visit scheduled: no  Notes to clinic:  pt due for CPE with provider.      Requested Prescriptions  Pending Prescriptions Disp Refills   budeson-glycopyrrolate-formoterol (BREZTRI  AEROSPHERE) 160-9-4.8 MCG/ACT AERO inhaler 10.7 g 11    Sig: Inhale 2 puffs into the lungs 2 (two) times daily.     Off-Protocol Failed - 11/24/2023  5:13 PM      Failed - Medication not assigned to a protocol, review manually.      Passed - Valid encounter within last 12 months    Recent Outpatient Visits           1 week ago Lymphedema of left arm   [facility] Cheril Cork, Cisco Crest, MD   1 week ago Lymphedema of left arm   [place] [facility] Cheril Cork, Cisco Crest, MD   2 weeks ago Lymphedema of left arm   [place] [facility] Cheril Cork, Cisco Crest, MD   5 months ago Primary osteoarthritis involving multiple joints   [place] [facility] Austine Lefort, MD   1 year ago Left maxillary sinusitis   [facility] Cheril Cork, Cisco Crest, MD               gabapentin  (NEURONTIN ) 300 MG capsule 30 capsule 5    Sig: TAKE 1 CAPSULE BY MOUTH EVERYDAY AT BEDTIME     Neurology: Anticonvulsants - gabapentin  Failed - 11/24/2023  5:13 PM      Failed - Cr in normal range and within 360 days    Creatinine  Date Value Ref Range Status  12/23/2016 0.7 0.6 - 1.1 mg/dL Final   Creat  Date Value Ref Range Status  06/08/2023 0.55 (L) 0.60 - 0.95 mg/dL Final         Passed - Completed PHQ-2 or PHQ-9 in the last 360 days      Passed - Valid encounter within last 12 months    Recent Outpatient Visits           1 week ago Lymphedema of left arm   [place] [facility] Austine Lefort, MD    1 week ago Lymphedema of left arm   [place] [facility] Austine Lefort, MD   2 weeks ago Lymphedema of left arm   [facility] Cheril Cork, Cisco Crest, MD   5 months ago Primary osteoarthritis involving multiple joints   [place] [facility] Austine Lefort, MD   1 year ago Left maxillary sinusitis    [facility] Pickard, Cisco Crest, MD               HYDROcodone -acetaminophen  (NORCO) 7.5-325 MG tablet 45 tablet 0    Sig: Take 1 tablet by mouth every 6 (six) hours as needed for moderate pain (pain score 4-6).     Not Delegated - Analgesics:  Opioid Agonist Combinations Failed - 11/24/2023  5:13 PM      Failed - This refill cannot be delegated      Failed - Urine Drug Screen completed in last 360 days      Failed - Valid encounter within last 3 months    Recent Outpatient Visits  1 week ago Lymphedema of left arm   [place] [facility] Cheril Cork, Cisco Crest, MD   1 week ago Lymphedema of left arm   [place] [facility] Cheril Cork Cisco Crest, MD   2 weeks ago Lymphedema of left arm   [place] [facility] Cheril Cork, Cisco Crest, MD   5 months ago Primary osteoarthritis involving multiple joints   [place] [facility] Pickard, Cisco Crest, MD   1 year ago Left maxillary sinusitis   [place] [facility] Pickard, Cisco Crest, MD

## 2023-11-27 MED ORDER — BREZTRI AEROSPHERE 160-9-4.8 MCG/ACT IN AERO: 2.0000 | INHALATION_SPRAY | Freq: Two times a day (BID) | RESPIRATORY_TRACT | 11 refills | Status: DC

## 2023-11-27 MED ORDER — HYDROCODONE-ACETAMINOPHEN 7.5-325 MG PO TABS: 1.0000 | ORAL_TABLET | Freq: Four times a day (QID) | ORAL | 0 refills | Status: DC | PRN

## 2023-11-27 MED ORDER — GABAPENTIN 300 MG PO CAPS: ORAL_CAPSULE | ORAL | 5 refills | Status: AC

## 2023-11-27 NOTE — Telephone Encounter (Signed)
 Last OV 11/16/23. Last refills on medications in attached TE.  Please authorize or deny refill requests.

## 2023-12-14 ENCOUNTER — Other Ambulatory Visit: Payer: Self-pay | Admitting: Family Medicine

## 2023-12-14 NOTE — Telephone Encounter (Signed)
 Prescription Request  12/14/2023  LOV: 11/16/2023  What is the name of the medication or equipment? omeprazole  (PRILOSEC) 20 MG capsule   Have you contacted your pharmacy to request a refill? Yes   Which pharmacy would you like this sent to?  [id number] - [place], [place] - [address] AT [facility] [address] [place] [address] Phone: [phone] Fax: [phone]    Patient notified that their request is being sent to the clinical staff for review and that they should receive a response within 2 business days.   Please advise at [facility] [phone]

## 2023-12-15 MED ORDER — OMEPRAZOLE 20 MG PO CPDR: 20.0000 mg | DELAYED_RELEASE_CAPSULE | Freq: Every day | ORAL | 1 refills | Status: AC

## 2023-12-15 NOTE — Telephone Encounter (Signed)
 Requested Prescriptions  Pending Prescriptions Disp Refills   omeprazole  (PRILOSEC) 20 MG capsule 90 capsule 1    Sig: Take 1 capsule (20 mg total) by mouth daily.     Gastroenterology: Proton Pump Inhibitors Passed - 12/15/2023  4:17 PM      Passed - Valid encounter within last 12 months    Recent Outpatient Visits           4 weeks ago Lymphedema of left arm   [place] [facility] Cheril Cork, Cisco Crest, MD   1 month ago Lymphedema of left arm   [place] [facility] Austine Lefort, MD   1 month ago Lymphedema of left arm   [place] [facility] Cheril Cork Cisco Crest, MD   6 months ago Primary osteoarthritis involving multiple joints   [place] [facility] Pickard, Cisco Crest, MD   1 year ago Left maxillary sinusitis   [place] [facility] Medicine Pickard, Cisco Crest, MD

## 2023-12-27 ENCOUNTER — Other Ambulatory Visit: Payer: Self-pay | Admitting: Family Medicine

## 2023-12-27 DIAGNOSIS — J45909 Unspecified asthma, uncomplicated: Secondary | ICD-10-CM

## 2023-12-27 NOTE — Telephone Encounter (Signed)
 Copied from CRM [id number]. Topic: Clinical - Medication Refill >> Dec 27, 2023 12:15 PM DeAngela L wrote: Medication: HYDROcodone -acetaminophen  (NORCO) 7.5-325 MG tablet budeson-glycopyrrolate-formoterol (BREZTRI  AEROSPHERE) 160-9-4.8 MCG/ACT AERO inhaler  Has the patient contacted their pharmacy? No  (Agent: If no, request that the patient contact the pharmacy for the refill. If patient does not wish to contact the pharmacy document the reason why and proceed with request.) (Agent: If yes, when and what did the pharmacy advise?)  This is the patient's preferred pharmacy:  [id number] [place], [place] - [address] AT [facility] [address] [place] [address] Phone: [phone] Fax: [phone]  Is this the correct pharmacy for this prescription? Yes  If no, delete pharmacy and type the correct one.   Has the prescription been filled recently? Yes   Is the patient out of the medication? Yes    Has the patient been seen for an appointment in the last year OR does the patient have an upcoming appointment? Yes   Can we respond through MyChart? No  Agent: Please be advised that Rx refills may take up to 3 business days. We ask that you follow-up with your pharmacy.

## 2023-12-29 NOTE — Telephone Encounter (Signed)
 Requested medication (s) are due for refill today: na   Requested medication (s) are on the active medication list: yes   Last refill:  norco- 11/27/23 #45 0 refills, Bretzi-11/27/23 #10.7 g 11 refills  Future visit scheduled: no  Notes to clinic:  last OV 11/16/23 norco not delegated per protocol,  bretzi- medication not assigned to a protocol. Do you want to refill Rxs?     Requested Prescriptions  Pending Prescriptions Disp Refills   HYDROcodone -acetaminophen  (NORCO) 7.5-325 MG tablet 45 tablet 0    Sig: Take 1 tablet by mouth every 6 (six) hours as needed for moderate pain (pain score 4-6).     Not Delegated - Analgesics:  Opioid Agonist Combinations Failed - 12/29/2023  2:00 PM      Failed - This refill cannot be delegated      Failed - Urine Drug Screen completed in last 360 days      Failed - Valid encounter within last 3 months    Recent Outpatient Visits           1 month ago Lymphedema of left arm   [place] [facility] Medicine Austine Lefort, MD   1 month ago Lymphedema of left arm   [place] [facility] Medicine Austine Lefort, MD   1 month ago Lymphedema of left arm   [place] [facility] Medicine Cheril Cork, Cisco Crest, MD   6 months ago Primary osteoarthritis involving multiple joints   [place] [facility] Pickard, Cisco Crest, MD   1 year ago Left maxillary sinusitis   [place] [facility] Family Medicine Pickard, Cisco Crest, MD               budesonide-glycopyrrolate-formoterol (BREZTRI  AEROSPHERE) 160-9-4.8 MCG/ACT AERO inhaler 10.7 g 11    Sig: Inhale 2 puffs into the lungs 2 (two) times daily.     Off-Protocol Failed - 12/29/2023  2:00 PM      Failed - Medication not assigned to a protocol, review manually.      Passed - Valid encounter within last 12 months    Recent Outpatient Visits           1 month ago Lymphedema of left arm   [place] [facility] Medicine Cheril Cork, Cisco Crest,  MD   1 month ago Lymphedema of left arm   [place] [facility] Medicine Cheril Cork, Cisco Crest, MD   1 month ago Lymphedema of left arm   [place] [facility] Family Medicine Cheril Cork, Cisco Crest, MD   6 months ago Primary osteoarthritis involving multiple joints   [place] [facility] Medicine Pickard, Cisco Crest, MD   1 year ago Left maxillary sinusitis   [place] [facility] Medicine Pickard, Cisco Crest, MD

## 2024-01-02 MED ORDER — BREZTRI AEROSPHERE 160-9-4.8 MCG/ACT IN AERO
2.0000 | INHALATION_SPRAY | Freq: Two times a day (BID) | RESPIRATORY_TRACT | 11 refills | Status: AC
Start: 2024-01-02 — End: ?

## 2024-01-02 MED ORDER — HYDROCODONE-ACETAMINOPHEN 7.5-325 MG PO TABS: 1.0000 | ORAL_TABLET | Freq: Four times a day (QID) | ORAL | 0 refills | Status: DC | PRN

## 2024-02-01 ENCOUNTER — Other Ambulatory Visit: Payer: Self-pay | Admitting: Family Medicine

## 2024-02-01 DIAGNOSIS — J209 Acute bronchitis, unspecified: Secondary | ICD-10-CM

## 2024-02-01 DIAGNOSIS — J441 Chronic obstructive pulmonary disease with (acute) exacerbation: Secondary | ICD-10-CM

## 2024-02-01 NOTE — Telephone Encounter (Signed)
 Copied from CRM [id number]. Topic: Clinical - Medication Refill >> Feb 01, 2024  4:53 PM Selinda RAMAN wrote: Medication: HYDROcodone -acetaminophen  (NORCO) 7.5-325 MG tablet  Has the patient contacted their pharmacy? No   This is the patient's preferred pharmacy:  [id number] [place], [place] - [address] AT [facility] [address] [place] [address] Phone: [phone] Fax: [phone]  Is this the correct pharmacy for this prescription? Yes If no, delete pharmacy and type the correct one.   Has the prescription been filled recently? No  Is the patient out of the medication? No but she only has a few pills left  Has the patient been seen for an appointment in the last year OR does the patient have an upcoming appointment? Yes  Can we respond through MyChart? No she would prefer a call or text  Please assist patient further

## 2024-02-06 ENCOUNTER — Other Ambulatory Visit: Payer: Self-pay

## 2024-02-06 ENCOUNTER — Telehealth: Payer: Self-pay | Admitting: Family Medicine

## 2024-02-06 DIAGNOSIS — J320 Chronic maxillary sinusitis: Secondary | ICD-10-CM

## 2024-02-06 MED ORDER — FLUTICASONE PROPIONATE 50 MCG/ACT NA SUSP: NASAL | 2 refills | Status: AC

## 2024-02-06 MED ORDER — HYDROCODONE-ACETAMINOPHEN 7.5-325 MG PO TABS: 1.0000 | ORAL_TABLET | Freq: Four times a day (QID) | ORAL | 0 refills | Status: DC | PRN

## 2024-02-06 NOTE — Telephone Encounter (Signed)
 Med sent.

## 2024-02-06 NOTE — Telephone Encounter (Signed)
 Requested medication (s) are due for refill today: yes es Requested medication (s) are on the active medication list: yes  Last refill:  01/02/24  Future visit scheduled: no  Notes to clinic:  Unable to refill per protocol, cannot delegate.      Requested Prescriptions  Pending Prescriptions Disp Refills   HYDROcodone -acetaminophen  (NORCO) 7.5-325 MG tablet 45 tablet 0    Sig: Take 1 tablet by mouth every 6 (six) hours as needed for moderate pain (pain score 4-6).     Not Delegated - Analgesics:  Opioid Agonist Combinations Failed - 02/06/2024  9:36 AM      Failed - This refill cannot be delegated      Failed - Urine Drug Screen completed in last 360 days      Passed - Valid encounter within last 3 months    Recent Outpatient Visits           2 months ago Lymphedema of left arm   [place] [facility] Medicine Duanne Butler DASEN, MD   2 months ago Lymphedema of left arm   [place] [facility] Medicine Duanne Butler DASEN, MD   2 months ago Lymphedema of left arm   [place] [facility] Duanne, Butler DASEN, MD   8 months ago Primary osteoarthritis involving multiple joints   [place] [facility] Medicine Duanne Butler DASEN, MD   2 years ago Left maxillary sinusitis   [place] [facility] Family Medicine Pickard, Butler DASEN, MD

## 2024-02-06 NOTE — Telephone Encounter (Signed)
 Prescription Request  02/06/2024  LOV: 11/16/2023  What is the name of the medication or equipment? fluticasone  (FLONASE ) 50 MCG/ACT nasal spray   Have you contacted your pharmacy to request a refill? Yes   Which pharmacy would you like this sent to?  [id number] [place], [place] - [address] AT [facility] [address]  [place] [address] Phone: [phone] Fax: [phone]    Patient notified that their request is being sent to the clinical staff for review and that they should receive a response within 2 business days.   Please advise at [facility] [facility] [phone]

## 2024-02-06 NOTE — Telephone Encounter (Signed)
 Requested Prescriptions  Pending Prescriptions Disp Refills   albuterol  (VENTOLIN  HFA) 108 (90 Base) MCG/ACT inhaler [Pharmacy Med Name: ALBUTEROL  HFA (VENTOLIN ) INH] 18 each 0    Sig: INHALE 2 PUFFS INTO LUNGS EVERY 6 HOURS AS NEEDED FOR WHEEZE/SHORTNESS OF BREATH     Pulmonology:  Beta Agonists 2 Passed - 02/06/2024  9:52 AM      Passed - Last BP in normal range    BP Readings from Last 1 Encounters:  11/16/23 118/62         Passed - Last Heart Rate in normal range    Pulse Readings from Last 1 Encounters:  11/16/23 (!) 59         Passed - Valid encounter within last 12 months    Recent Outpatient Visits           2 months ago Lymphedema of left arm   [place] [facility] Medicine Duanne, Butler DASEN, MD   2 months ago Lymphedema of left arm   [place] [facility] Duanne Butler DASEN, MD   2 months ago Lymphedema of left arm   [place] [facility] Medicine Duanne, Butler DASEN, MD   8 months ago Primary osteoarthritis involving multiple joints   [place] [facility] Medicine Duanne Butler DASEN, MD   2 years ago Left maxillary sinusitis    [facility] Pickard, Butler DASEN, MD

## 2024-03-06 ENCOUNTER — Other Ambulatory Visit: Payer: Self-pay | Admitting: Family Medicine

## 2024-03-06 DIAGNOSIS — J209 Acute bronchitis, unspecified: Secondary | ICD-10-CM

## 2024-03-06 DIAGNOSIS — J441 Chronic obstructive pulmonary disease with (acute) exacerbation: Secondary | ICD-10-CM

## 2024-03-08 ENCOUNTER — Telehealth: Payer: Self-pay | Admitting: Family Medicine

## 2024-03-08 NOTE — Telephone Encounter (Signed)
 Copied from CRM [id number]. Topic: Clinical - Medication Refill >> Mar 08, 2024 11:11 AM Leonette P wrote: Medication: Hydrocodone  7.5-325  Has the patient contacted their pharmacy? No (Agent: If no, request that the patient contact the pharmacy for the refill. If patient does not wish to contact the pharmacy document the reason why and proceed with request.) (Agent: If yes, when and what did the pharmacy advise?)  This is the patient's preferred pharmacy:  [id number] [place] - [address] AT [facility] [address] [place] [address] Phone: [phone] Fax: [phone]  Is this the correct pharmacy for this prescription? Yes If no, delete pharmacy and type the correct one.   Has the prescription been filled recently? Yes  Is the patient out of the medication? Yes  Has the patient been seen for an appointment in the last year OR does the patient have an upcoming appointment? No  Can we respond through MyChart? No  Agent: Please be advised that Rx refills may take up to 3 business days. We ask that you follow-up with your pharmacy.

## 2024-03-10 ENCOUNTER — Other Ambulatory Visit: Payer: Self-pay | Admitting: Family Medicine

## 2024-03-10 MED ORDER — HYDROCODONE-ACETAMINOPHEN 7.5-325 MG PO TABS: 1.0000 | ORAL_TABLET | Freq: Four times a day (QID) | ORAL | 0 refills | Status: DC | PRN

## 2024-04-04 ENCOUNTER — Telehealth: Payer: Self-pay | Admitting: Family Medicine

## 2024-04-04 ENCOUNTER — Other Ambulatory Visit: Payer: Self-pay | Admitting: Family Medicine

## 2024-04-04 DIAGNOSIS — J441 Chronic obstructive pulmonary disease with (acute) exacerbation: Secondary | ICD-10-CM

## 2024-04-04 DIAGNOSIS — J209 Acute bronchitis, unspecified: Secondary | ICD-10-CM

## 2024-04-04 MED ORDER — HYDROCODONE-ACETAMINOPHEN 7.5-325 MG PO TABS: 1.0000 | ORAL_TABLET | Freq: Four times a day (QID) | ORAL | 0 refills | Status: DC | PRN

## 2024-04-04 NOTE — Telephone Encounter (Unsigned)
 Copied from CRM [id number]. Topic: Clinical - Medication Refill >> Apr 04, 2024  1:37 PM Carla L wrote: Medication: HYDROcodone -acetaminophen  (NORCO) 7.5-325 MG tablet  Has the patient contacted their pharmacy? Yes Told to contact the office.   This is the patient's preferred pharmacy:  [id number] [place], [place] - [address] AT [facility] [address] [place] [address] Phone: [phone] Fax: [phone]  Is this the correct pharmacy for this prescription? Yes  Has the prescription been filled recently? No  Is the patient out of the medication? Yes  Has the patient been seen for an appointment in the last year OR does the patient have an upcoming appointment? Yes  Can we respond through MyChart? No  Agent: Please be advised that Rx refills may take up to 3 business days. We ask that you follow-up with your pharmacy.

## 2024-04-30 ENCOUNTER — Other Ambulatory Visit: Payer: Self-pay | Admitting: Family Medicine

## 2024-04-30 DIAGNOSIS — J209 Acute bronchitis, unspecified: Secondary | ICD-10-CM

## 2024-04-30 DIAGNOSIS — J441 Chronic obstructive pulmonary disease with (acute) exacerbation: Secondary | ICD-10-CM

## 2024-05-02 ENCOUNTER — Other Ambulatory Visit: Payer: Self-pay | Admitting: Family Medicine

## 2024-05-02 NOTE — Telephone Encounter (Unsigned)
 Copied from CRM [id number]. Topic: Clinical - Medication Refill >> May 02, 2024  1:02 PM Dedra B wrote: Medication: HYDROcodone -acetaminophen  (NORCO) 7.5-325 MG tablet  Has the patient contacted their pharmacy? No, must contact office for refills   This is the patient's preferred pharmacy:  [id number] [place], [place] - [address] AT [facility] [address] [place] [address] Phone: [phone] Fax: [phone]  Is this the correct pharmacy for this prescription? Yes  Has the prescription been filled recently? No  Is the patient out of the medication? Yes  Has the patient been seen for an appointment in the last year OR does the patient have an upcoming appointment? Yes  Can we respond through MyChart? No  Agent: Please be advised that Rx refills may take up to 3 business days. We ask that you follow-up with your pharmacy.

## 2024-05-03 MED ORDER — HYDROCODONE-ACETAMINOPHEN 7.5-325 MG PO TABS: 1.0000 | ORAL_TABLET | Freq: Four times a day (QID) | ORAL | 0 refills | Status: DC | PRN

## 2024-05-03 NOTE — Telephone Encounter (Signed)
 Requested medication (s) are due for refill today: na   Requested medication (s) are on the active medication list: yes   Last refill:  04/04/24 #45 0 refills  Future visit scheduled: no  Notes to clinic:  not delegated per protocol. Do you want to refill Rx?     Requested Prescriptions  Pending Prescriptions Disp Refills   HYDROcodone -acetaminophen  (NORCO) 7.5-325 MG tablet 45 tablet 0    Sig: Take 1 tablet by mouth every 6 (six) hours as needed for moderate pain (pain score 4-6).     Not Delegated - Analgesics:  Opioid Agonist Combinations Failed - 05/03/2024  2:21 PM      Failed - This refill cannot be delegated      Failed - Urine Drug Screen completed in last 360 days      Failed - Valid encounter within last 3 months    Recent Outpatient Visits           5 months ago Lymphedema of left arm   [place] [facility] Duanne Butler DASEN, MD   5 months ago Lymphedema of left arm   [place] [facility] Duanne Butler DASEN, MD   5 months ago Lymphedema of left arm   [place] [facility] Medicine Duanne Butler DASEN, MD   11 months ago Primary osteoarthritis involving multiple joints   [facility] Medicine Duanne Butler DASEN, MD   2 years ago Left maxillary sinusitis    [facility] Family Medicine Pickard, Butler DASEN, MD

## 2024-05-29 ENCOUNTER — Other Ambulatory Visit: Payer: Self-pay | Admitting: Family Medicine

## 2024-05-29 DIAGNOSIS — J441 Chronic obstructive pulmonary disease with (acute) exacerbation: Secondary | ICD-10-CM

## 2024-05-29 DIAGNOSIS — J209 Acute bronchitis, unspecified: Secondary | ICD-10-CM

## 2024-05-30 ENCOUNTER — Other Ambulatory Visit: Payer: Self-pay | Admitting: Family Medicine

## 2024-05-30 NOTE — Telephone Encounter (Unsigned)
 Copied from CRM [id number]. Topic: Clinical - Medication Refill >> May 30, 2024  1:42 PM Amy B wrote: Medication: HYDROcodone -acetaminophen  (NORCO) 7.5-325 MG tablet  Has the patient contacted their pharmacy? Yes (Agent: If no, request that the patient contact the pharmacy for the refill. If patient does not wish to contact the pharmacy document the reason why and proceed with request.) (Agent: If yes, when and what did the pharmacy advise?)  This is the patient's preferred pharmacy:  [id number] [place], [place] - [address] AT [facility] [address] [place] [address] Phone: [phone] Fax: [phone]  Is this the correct pharmacy for this prescription? Yes If no, delete pharmacy and type the correct one.   Has the prescription been filled recently? No  Is the patient out of the medication? Yes  Has the patient been seen for an appointment in the last year OR does the patient have an upcoming appointment? Yes  Can we respond through MyChart? No  Agent: Please be advised that Rx refills may take up to 3 business days. We ask that you follow-up with your pharmacy.

## 2024-05-30 NOTE — Telephone Encounter (Signed)
 Requested Prescriptions  Pending Prescriptions Disp Refills   albuterol  (VENTOLIN  HFA) 108 (90 Base) MCG/ACT inhaler [Pharmacy Med Name: ALBUTEROL  HFA (VENTOLIN ) INH] 18 each 1    Sig: INHALE 2 PUFFS INTO LUNGS EVERY 6 HOURS AS NEEDED FOR WHEEZE/SHORTNESS OF BREATH     Pulmonology:  Beta Agonists 2 Passed - 05/30/2024  1:38 PM      Passed - Last BP in normal range    BP Readings from Last 1 Encounters:  11/16/23 118/62         Passed - Last Heart Rate in normal range    Pulse Readings from Last 1 Encounters:  11/16/23 (!) 59         Passed - Valid encounter within last 12 months    Recent Outpatient Visits           6 months ago Lymphedema of left arm   [place] [facility] Duanne, Butler DASEN, MD   6 months ago Lymphedema of left arm   [place] [facility] Duanne Butler DASEN, MD   6 months ago Lymphedema of left arm   [place] [facility] Medicine Duanne, Butler DASEN, MD   11 months ago Primary osteoarthritis involving multiple joints   [place] [facility] Medicine Duanne Butler DASEN, MD   2 years ago Left maxillary sinusitis   [place] [facility] Medicine Pickard, Butler DASEN, MD

## 2024-05-31 NOTE — Telephone Encounter (Signed)
 Requested medications are due for refill today.  yes  Requested medications are on the active medications list.  yes  Last refill. 05/03/2024 #45 0 rf  Future visit scheduled.   no  Notes to clinic.  Refill not delegated.    Requested Prescriptions  Pending Prescriptions Disp Refills   HYDROcodone -acetaminophen  (NORCO) 7.5-325 MG tablet 45 tablet 0    Sig: Take 1 tablet by mouth every 6 (six) hours as needed for moderate pain (pain score 4-6).     Not Delegated - Analgesics:  Opioid Agonist Combinations Failed - 05/31/2024  5:22 PM      Failed - This refill cannot be delegated      Failed - Urine Drug Screen completed in last 360 days      Failed - Valid encounter within last 3 months    Recent Outpatient Visits           6 months ago Lymphedema of left arm   [place] [facility] Medicine Duanne Butler DASEN, MD   6 months ago Lymphedema of left arm   [place] [facility] Medicine Duanne Butler DASEN, MD   6 months ago Lymphedema of left arm   [place] [facility] Medicine Duanne Butler DASEN, MD   11 months ago Primary osteoarthritis involving multiple joints   [place] [facility] Medicine Duanne Butler DASEN, MD   2 years ago Left maxillary sinusitis   [place] [facility] Medicine Pickard, Butler DASEN, MD

## 2024-06-03 ENCOUNTER — Telehealth: Payer: Self-pay

## 2024-06-03 ENCOUNTER — Other Ambulatory Visit: Payer: Self-pay

## 2024-06-03 MED ORDER — HYDROCODONE-ACETAMINOPHEN 7.5-325 MG PO TABS: 1.0000 | ORAL_TABLET | Freq: Four times a day (QID) | ORAL | 0 refills | Status: DC | PRN

## 2024-06-03 NOTE — Telephone Encounter (Signed)
 Copied from CRM #[id number]. Topic: Clinical - Medication Question >> Jun 03, 2024  2:08 PM Avram MATSU wrote: Reason for CRM: Patient is calling to follow up on her refill request for HYDROcodone -acetaminophen  (NORCO) 7.5-325 MG tablet [[id number]] please advise [phone] (M)

## 2024-06-03 NOTE — Telephone Encounter (Signed)
Request sent to PCP

## 2024-07-05 ENCOUNTER — Telehealth: Payer: Self-pay

## 2024-07-05 ENCOUNTER — Other Ambulatory Visit: Payer: Self-pay | Admitting: Family Medicine

## 2024-07-05 MED ORDER — HYDROCODONE-ACETAMINOPHEN 7.5-325 MG PO TABS: 1.0000 | ORAL_TABLET | Freq: Four times a day (QID) | ORAL | 0 refills | Status: DC | PRN

## 2024-07-05 NOTE — Telephone Encounter (Signed)
 Copied from CRM #[id number]. Topic: Clinical - Prescription Issue >> Jul 05, 2024 11:54 AM Delon DASEN wrote: Reason for CRM: HYDROcodone -acetaminophen  (NORCO) 7.5-325 MG tablet- patient called to find out why refill is not ready yet

## 2024-07-31 ENCOUNTER — Other Ambulatory Visit: Payer: Self-pay | Admitting: Family Medicine

## 2024-07-31 NOTE — Telephone Encounter (Signed)
 Requested medication (s) are due for refill today: Yes  Requested medication (s) are on the active medication list: Yes  Last refill:  07/05/24  Future visit scheduled: No  Notes to clinic:  Unable to refill per protocol, cannot delegate.      Requested Prescriptions  Pending Prescriptions Disp Refills   HYDROcodone -acetaminophen  (NORCO) 7.5-325 MG tablet 45 tablet 0    Sig: Take 1 tablet by mouth every 6 (six) hours as needed for moderate pain (pain score 4-6).     Not Delegated - Analgesics:  Opioid Agonist Combinations Failed - 07/31/2024  4:41 PM      Failed - This refill cannot be delegated      Failed - Urine Drug Screen completed in last 360 days      Failed - Valid encounter within last 3 months    Recent Outpatient Visits           8 months ago Lymphedema of left arm   [place] [facility] Duanne Butler DASEN, MD   8 months ago Lymphedema of left arm   [place] [facility] Medicine Duanne Butler DASEN, MD   8 months ago Lymphedema of left arm   [place] [facility] Medicine Duanne, Butler DASEN, MD   1 year ago Primary osteoarthritis involving multiple joints   [place] [facility] Duanne Butler DASEN, MD   2 years ago Left maxillary sinusitis   [place] [facility] Family Medicine Pickard, Butler DASEN, MD

## 2024-07-31 NOTE — Telephone Encounter (Unsigned)
 Copied from CRM #[id number]. Topic: Clinical - Medication Refill >> Jul 31, 2024  1:47 PM Delon T wrote: Medication: HYDROcodone -acetaminophen  (NORCO) 7.5-325 MG tablet  Has the patient contacted their pharmacy? Yes (Agent: If no, request that the patient contact the pharmacy for the refill. If patient does not wish to contact the pharmacy document the reason why and proceed with request.) (Agent: If yes, when and what did the pharmacy advise?)  This is the patient's preferred pharmacy:  [id number] [place], [place] - [address] AT [facility] [address] [place] [address] Phone: [phone] Fax: [phone]  Is this the correct pharmacy for this prescription? Yes If no, delete pharmacy and type the correct one.   Has the prescription been filled recently? Yes  Is the patient out of the medication? Yes  Has the patient been seen for an appointment in the last year OR does the patient have an upcoming appointment? Yes  Can we respond through MyChart? Yes  Agent: Please be advised that Rx refills may take up to 3 business days. We ask that you follow-up with your pharmacy.

## 2024-08-02 ENCOUNTER — Other Ambulatory Visit: Payer: Self-pay | Admitting: Family Medicine

## 2024-08-02 ENCOUNTER — Telehealth: Payer: Self-pay

## 2024-08-02 DIAGNOSIS — J209 Acute bronchitis, unspecified: Secondary | ICD-10-CM

## 2024-08-02 DIAGNOSIS — J441 Chronic obstructive pulmonary disease with (acute) exacerbation: Secondary | ICD-10-CM

## 2024-08-02 MED ORDER — HYDROCODONE-ACETAMINOPHEN 7.5-325 MG PO TABS: 1.0000 | ORAL_TABLET | Freq: Four times a day (QID) | ORAL | 0 refills | Status: DC | PRN

## 2024-08-02 NOTE — Telephone Encounter (Signed)
 Copied from CRM [id number]. Topic: Clinical - Prescription Issue >> Aug 02, 2024  1:49 PM [name] wrote: Reason for CRM: patient is out of medication HYDROcodone -acetaminophen  (NORCO) 7.5-325 MG tablet. She would like a update on her refill from 07/31/24

## 2024-08-29 ENCOUNTER — Other Ambulatory Visit: Payer: Self-pay | Admitting: Family Medicine

## 2024-08-29 NOTE — Telephone Encounter (Signed)
 Copied from CRM [id number]. Topic: Clinical - Medication Refill >> Aug 29, 2024  1:51 PM Sophia H wrote: Medication: HYDROcodone -acetaminophen  (NORCO) 7.5-325 MG tablet   Has the patient contacted their pharmacy? Yes, controlled substance.   This is the patient's preferred pharmacy:  [id number] [place], [place] - [address] AT [facility] [address] [place] [address] Phone: [phone] Fax: [phone]  Is this the correct pharmacy for this prescription? Yes If no, delete pharmacy and type the correct one.   Has the prescription been filled recently? Yes  Is the patient out of the medication? Yes, completely out.   Has the patient been seen for an appointment in the last year OR does the patient have an upcoming appointment? Yes, seen in April 2025.  Can we respond through MyChart? No, prefers phone call.   Agent: Please be advised that Rx refills may take up to 3 business days. We ask that you follow-up with your pharmacy.

## 2024-09-02 MED ORDER — HYDROCODONE-ACETAMINOPHEN 7.5-325 MG PO TABS: 1.0000 | ORAL_TABLET | Freq: Four times a day (QID) | ORAL | 0 refills | Status: AC | PRN
